# Patient Record
Sex: Female | Born: 1950 | Race: White | Hispanic: No | Marital: Married | Smoking: Never smoker
Health system: Southern US, Community
[De-identification: ages and names within clinical notes are randomized; demographics above are authoritative.]

## PROBLEM LIST (undated history)

## (undated) DIAGNOSIS — Z923 Personal history of irradiation: Secondary | ICD-10-CM

## (undated) DIAGNOSIS — E039 Hypothyroidism, unspecified: Secondary | ICD-10-CM

## (undated) DIAGNOSIS — D649 Anemia, unspecified: Secondary | ICD-10-CM

## (undated) DIAGNOSIS — C50919 Malignant neoplasm of unspecified site of unspecified female breast: Secondary | ICD-10-CM

## (undated) DIAGNOSIS — E785 Hyperlipidemia, unspecified: Secondary | ICD-10-CM

## (undated) DIAGNOSIS — A630 Anogenital (venereal) warts: Secondary | ICD-10-CM

## (undated) DIAGNOSIS — K219 Gastro-esophageal reflux disease without esophagitis: Secondary | ICD-10-CM

## (undated) DIAGNOSIS — M1990 Unspecified osteoarthritis, unspecified site: Secondary | ICD-10-CM

## (undated) HISTORY — PX: APPENDECTOMY: SHX54

## (undated) HISTORY — DX: Anogenital (venereal) warts: A63.0

## (undated) HISTORY — DX: Hypothyroidism, unspecified: E03.9

## (undated) HISTORY — DX: Hyperlipidemia, unspecified: E78.5

## (undated) HISTORY — DX: Gastro-esophageal reflux disease without esophagitis: K21.9

## (undated) HISTORY — DX: Malignant neoplasm of unspecified site of unspecified female breast: C50.919

## (undated) HISTORY — PX: WRIST FRACTURE SURGERY: SHX121

## (undated) HISTORY — PX: OTHER SURGICAL HISTORY: SHX169

---

## 2008-09-12 ENCOUNTER — Ambulatory Visit: Payer: Self-pay | Admitting: Unknown Physician Specialty

## 2010-05-23 ENCOUNTER — Ambulatory Visit: Payer: Self-pay | Admitting: Unknown Physician Specialty

## 2012-05-16 ENCOUNTER — Ambulatory Visit: Payer: Self-pay | Admitting: Internal Medicine

## 2012-05-16 DIAGNOSIS — M25569 Pain in unspecified knee: Secondary | ICD-10-CM | POA: Insufficient documentation

## 2012-06-24 ENCOUNTER — Ambulatory Visit: Payer: Self-pay | Admitting: Orthopedic Surgery

## 2013-05-11 ENCOUNTER — Emergency Department: Payer: Self-pay | Admitting: Emergency Medicine

## 2016-03-09 DIAGNOSIS — Z923 Personal history of irradiation: Secondary | ICD-10-CM

## 2016-03-09 DIAGNOSIS — C50919 Malignant neoplasm of unspecified site of unspecified female breast: Secondary | ICD-10-CM

## 2016-03-09 HISTORY — PX: BREAST LUMPECTOMY: SHX2

## 2016-03-09 HISTORY — DX: Malignant neoplasm of unspecified site of unspecified female breast: C50.919

## 2016-03-09 HISTORY — DX: Personal history of irradiation: Z92.3

## 2016-07-29 ENCOUNTER — Other Ambulatory Visit (HOSPITAL_COMMUNITY): Payer: Self-pay | Admitting: Obstetrics and Gynecology

## 2016-08-25 ENCOUNTER — Ambulatory Visit (INDEPENDENT_AMBULATORY_CARE_PROVIDER_SITE_OTHER): Payer: BC Managed Care – PPO | Admitting: Primary Care

## 2016-08-25 ENCOUNTER — Encounter: Payer: Self-pay | Admitting: Primary Care

## 2016-08-25 VITALS — BP 128/78 | HR 74 | Temp 98.3°F | Ht 60.0 in | Wt 147.8 lb

## 2016-08-25 DIAGNOSIS — E785 Hyperlipidemia, unspecified: Secondary | ICD-10-CM

## 2016-08-25 DIAGNOSIS — E039 Hypothyroidism, unspecified: Secondary | ICD-10-CM

## 2016-08-25 MED ORDER — LEVOTHYROXINE SODIUM 100 MCG PO TABS: ORAL_TABLET | ORAL | 1 refills | Status: DC

## 2016-08-25 NOTE — Progress Notes (Signed)
   Subjective:    Patient ID: Mallory Leblanc, female    DOB: 08-04-1950, 66 y.o.   MRN: [id number]  HPI  Ms. Mallory Leblanc is a 65 year old female who presents today to establish care and discuss the problems mentioned below. Will obtain old records. She is currently following with OB/GYN who completed labs last week.  1) Hypothyroidism: Currently managed on levothyroxine 100 mcg. Her last TSH was normal, lower end of normal on 08/21/16. She denies palpitations, fatigue, hair loss. She is needing a refill today.  2) Hyperlipidemia: Currently managed on Simvastatin 20 mg. Her last lipid panel was on 08/21/16 with TC of 201, Trigs of 236, LDL of 98. She endorses a poor diet and is not exercising. She would like to work on diet and exercise.  Diet currently consists of:  Breakfast: Boiled egg, pop tart, pizza, french toast sticks Lunch: Lunchable, crackers with tuna and chicken, sandwich Dinner: Pasta, salad, chicken, beans, some bread, fried fish, baked potato  Snacks: Candy, fruit Desserts: Occasionally  Beverages: Water, coffee, some soda, occasional sweet tea, beer  Exercise: She is not currently execising    Review of Systems  Constitutional: Negative for fatigue.  Eyes: Negative for visual disturbance.  Respiratory: Negative for shortness of breath.   Cardiovascular: Negative for chest pain and palpitations.  Musculoskeletal: Negative for myalgias.  Neurological: Negative for headaches.       Past Medical History:  Diagnosis Date  . Genital warts   . GERD (gastroesophageal reflux disease)   . Hyperlipidemia   . Hypothyroidism      Social History   Social History  . Marital status: Married    Spouse name: N/A  . Number of children: N/A  . Years of education: N/A   Occupational History  . Not on file.   Social History Main Topics  . Smoking status: Never Smoker  . Smokeless tobacco: Never Used  . Alcohol use Yes  . Drug use: Unknown  . Sexual activity: Not on file    Other Topics Concern  . Not on file   Social History Narrative   Married.   1 child.    Works as a Optometrist.   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    Past Surgical History:  Procedure Laterality Date  . cyst     on the left ovary early 80's  . WRIST FRACTURE SURGERY Right     Family History  Problem Relation Age of Onset  . Heart disease Father     Allergies  Allergen Reactions  . Sulfa Antibiotics Rash    No current outpatient prescriptions on file prior to visit.   No current facility-administered medications on file prior to visit.     BP 128/78   Pulse 74   Temp 98.3 F (36.8 C) (Oral)   Ht 5' (1.524 m)   Wt 147 lb 12.8 oz (67 kg)   SpO2 98%   BMI 28.87 kg/m    Objective:   Physical Exam  Constitutional: She appears well-nourished.  Neck: Neck supple. No thyromegaly present.  Cardiovascular: Normal rate and regular rhythm.   Pulmonary/Chest: Effort normal and breath sounds normal.  Skin: Skin is warm and dry.  Psychiatric: She has a normal mood and affect.          Assessment & Plan:

## 2016-08-25 NOTE — Patient Instructions (Signed)
I sent refills of the levothyroxine 100 mcg tablets to your pharmacy.   It's important to improve your diet by reducing consumption of fast food, fried food, processed snack foods, sugary drinks. Increase consumption of fresh vegetables and fruits, whole grains, water.  Ensure you are drinking 64 ounces of water daily.  Start exercising. You should be getting 150 minutes of moderate intensity exercise weekly.  Schedule a lab only appointment in 3 months to recheck your cholesterol. Be sure to come fasting to this appointment.  It was a pleasure to meet you today! Please don't hesitate to call me with any questions. Welcome to [facility]!

## 2016-08-25 NOTE — Assessment & Plan Note (Signed)
Recent TSH stable, lower end of normal. Refill for levothyroxine 100 mcg sent to pharmacy. Recheck in 3 months to ensure stability.

## 2016-08-25 NOTE — Assessment & Plan Note (Signed)
Recent lipid panel with TC and Trigs above goal. Poor diet and does not exercise. Will have her work on both exercise and diet, repeat lipids in three months. If lipids above goal at that point then increase Zocor to 40 mg.

## 2016-09-02 ENCOUNTER — Other Ambulatory Visit: Payer: Self-pay | Admitting: *Deleted

## 2016-09-02 ENCOUNTER — Inpatient Hospital Stay
Admission: RE | Admit: 2016-09-02 | Discharge: 2016-09-02 | Disposition: A | Discharge status: Home / Self Care | Payer: Self-pay | Source: Ambulatory Visit | Attending: *Deleted | Admitting: *Deleted

## 2016-09-02 DIAGNOSIS — Z9289 Personal history of other medical treatment: Secondary | ICD-10-CM

## 2016-09-04 ENCOUNTER — Other Ambulatory Visit: Payer: Self-pay | Admitting: Obstetrics and Gynecology

## 2016-09-04 DIAGNOSIS — R921 Mammographic calcification found on diagnostic imaging of breast: Secondary | ICD-10-CM

## 2016-09-08 ENCOUNTER — Ambulatory Visit
Admission: RE | Admit: 2016-09-08 | Discharge: 2016-09-08 | Disposition: A | Discharge status: Home / Self Care | Payer: BC Managed Care – PPO | Source: Ambulatory Visit | Attending: Obstetrics and Gynecology | Admitting: Obstetrics and Gynecology

## 2016-09-08 DIAGNOSIS — R921 Mammographic calcification found on diagnostic imaging of breast: Secondary | ICD-10-CM | POA: Diagnosis present

## 2016-09-14 ENCOUNTER — Other Ambulatory Visit: Payer: Self-pay | Admitting: Obstetrics and Gynecology

## 2016-09-14 DIAGNOSIS — R921 Mammographic calcification found on diagnostic imaging of breast: Secondary | ICD-10-CM

## 2016-09-14 DIAGNOSIS — R928 Other abnormal and inconclusive findings on diagnostic imaging of breast: Secondary | ICD-10-CM

## 2016-09-17 ENCOUNTER — Encounter: Payer: Self-pay | Admitting: Primary Care

## 2016-09-21 ENCOUNTER — Ambulatory Visit
Admission: RE | Admit: 2016-09-21 | Discharge: 2016-09-21 | Disposition: A | Discharge status: Home / Self Care | Payer: BC Managed Care – PPO | Source: Ambulatory Visit | Attending: Obstetrics and Gynecology | Admitting: Obstetrics and Gynecology

## 2016-09-21 DIAGNOSIS — R928 Other abnormal and inconclusive findings on diagnostic imaging of breast: Secondary | ICD-10-CM

## 2016-09-21 DIAGNOSIS — D0512 Intraductal carcinoma in situ of left breast: Secondary | ICD-10-CM | POA: Diagnosis not present

## 2016-09-21 DIAGNOSIS — R921 Mammographic calcification found on diagnostic imaging of breast: Secondary | ICD-10-CM | POA: Diagnosis present

## 2016-09-21 HISTORY — PX: BREAST BIOPSY: SHX20

## 2016-09-24 ENCOUNTER — Other Ambulatory Visit: Payer: Self-pay

## 2016-09-24 NOTE — Progress Notes (Signed)
  Oncology Nurse Navigator Documentation  Navigator Location: [facility] On[facility] (09/24/16 1500)   )Navigator Encounter Type: Introductory phone call (09/24/16 1500)   Abnormal Finding Date: 09/08/16 (09/24/16 1500) Confirmed Diagnosis Date: 09/21/16 (09/24/16 1500)               Patient Visit Type: Initial (09/24/16 1500)   Barriers/Navigation Needs: Education;Coordination of Care (09/24/16 1500) Education: Accessing Care/ Finding Providers;Coping with Diagnosis/ Prognosis;Newly Diagnosed Cancer Education (09/24/16 1500) Interventions: Coordination of Care;Education (09/24/16 1500)   Coordination of Care: Appts (09/24/16 1500) Education Method: Written;Verbal;Teach-back (09/24/16 1500)                Time Spent with Patient: 60 (09/24/16 1500)   Phoned patient to introduce Navigation service.  Scheduled Med/Onc appointment with Dr. Tasia Catchings on 09/29/16 at 10:00, and Surgical consult with Dr. Tamala Julian on 10/08/16 at 12:30.  Patient came by [place] to pick up Breast Cancer Treatment Handbook/folder with hospital services.

## 2016-09-25 LAB — SURGICAL PATHOLOGY

## 2016-09-29 ENCOUNTER — Encounter: Payer: Self-pay | Admitting: Oncology

## 2016-09-29 ENCOUNTER — Encounter: Payer: Self-pay | Admitting: *Deleted

## 2016-09-29 ENCOUNTER — Inpatient Hospital Stay: Payer: BC Managed Care – PPO | Attending: Oncology | Admitting: Oncology

## 2016-09-29 VITALS — BP 167/85 | HR 78 | Wt 146.8 lb

## 2016-09-29 DIAGNOSIS — E039 Hypothyroidism, unspecified: Secondary | ICD-10-CM | POA: Insufficient documentation

## 2016-09-29 DIAGNOSIS — Z79899 Other long term (current) drug therapy: Secondary | ICD-10-CM | POA: Diagnosis not present

## 2016-09-29 DIAGNOSIS — E785 Hyperlipidemia, unspecified: Secondary | ICD-10-CM | POA: Diagnosis not present

## 2016-09-29 DIAGNOSIS — D0512 Intraductal carcinoma in situ of left breast: Secondary | ICD-10-CM | POA: Diagnosis not present

## 2016-09-29 DIAGNOSIS — K219 Gastro-esophageal reflux disease without esophagitis: Secondary | ICD-10-CM | POA: Diagnosis not present

## 2016-09-29 DIAGNOSIS — D0510 Intraductal carcinoma in situ of unspecified breast: Secondary | ICD-10-CM | POA: Insufficient documentation

## 2016-09-29 DIAGNOSIS — Z853 Personal history of malignant neoplasm of breast: Secondary | ICD-10-CM | POA: Insufficient documentation

## 2016-09-29 NOTE — Progress Notes (Signed)
[facility] Consultation Visit:  Patient Care Team: Pleas Koch, NP as PCP - General (Internal Medicine)  CHIEF COMPLAINTS/PURPOSE OF CONSULTATION: I have breast cancer. HISTORY OF PRESENTING ILLNESS: Mallory Leblanc 66 y.o. 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 Patient had screening mammogram done on 08/21/2016 with [facility] healthcare system which revealed grouped calcification that approximately 1 cm in the upper outer quadrant of the left breast 9 cm from nipple which are indicated to terminate a prominent left axillary lymph node is present and is unchanged when compared to the ultrasound of 13 and 2711 studies no other dermatitis is seen in the left breast there are no suspicious masses malignant calcifications site of architecture distortion or concerning asymmetries in the right breast. Patient had diagnostic mammogram unilateral left down on September 09 2006, followed by rest left upper outer quadrant stereotactic biopsy. Pathology showed DCIS intermediate grade, calcifications associated with DCIS. DCIS is present in 4 out of 6 blocks with the largest focus measuring 6 mm. ER more than 90% positive. PR 50-90% positive. Today patient was accompanied by her husband to our clinic. She appears mild anxious. She denies any complaints. Denies any chest pain shortness of breath abdominal pain, lumps or bumps, back pain. She is postmenopausal.  Review of Systems  Constitutional: Negative.   HENT:  Negative.   Eyes: Negative.   Respiratory: Negative.   Cardiovascular: Negative.   Gastrointestinal: Negative.   Endocrine: Negative.   Genitourinary: Negative.    Musculoskeletal: Negative.   Skin: Negative.   Neurological: Negative.   Hematological: Negative.   Psychiatric/Behavioral: The patient is nervous/anxious.     MEDICAL HISTORY: Past Medical History:   Diagnosis Date  . Genital warts   . GERD (gastroesophageal reflux disease)   . Hyperlipidemia   . Hypothyroidism     SURGICAL HISTORY: Past Surgical History:  Procedure Laterality Date  . APPENDECTOMY    . BREAST BIOPSY Left 09/21/2016   Left Affirm Bx- Path pending  . cyst     on the left ovary early 80's  . WRIST FRACTURE SURGERY Right     SOCIAL HISTORY: Social History   Social History  . Marital status: Married    Spouse name: N/A  . Number of children: N/A  . Years of education: N/A   Occupational History  . Not on file.   Social History Main Topics  . Smoking status: Never Smoker  . Smokeless tobacco: Never Used  . Alcohol use Yes  . Drug use: No  . Sexual activity: Not on file   Other Topics Concern  . Not on file   Social History Narrative   Married.   1 child.    Works as a Optometrist.   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    FAMILY HISTORY Family History  Problem Relation Age of Onset  . Heart disease Father     ALLERGIES:  is allergic to sulfa antibiotics.  MEDICATIONS:  Current Outpatient Prescriptions  Medication Sig Dispense Refill  . levothyroxine (SYNTHROID, LEVOTHROID) 100 MCG tablet Take 1 tablet by mouth every morning on an empty stomach with a full glass of water. 90 tablet 1  . simvastatin (ZOCOR) 20 MG tablet TAKE ONE AND ONE-HALF TABLET BY MOUTH DAILY    . vitamin B-12 (CYANOCOBALAMIN) 1000 MCG tablet Take 1,000 mcg by mouth daily.    Marland Kitchen  Vitamin D, Ergocalciferol, 2000 units CAPS Take 1 capsule by mouth daily.     No current facility-administered medications for this visit.     PHYSICAL EXAMINATION:  ECOG PERFORMANCE STATUS: 0 - Asymptomatic   Vitals:   09/29/16 1013  BP: (!) 167/85  Pulse: 78    Filed Weights   09/29/16 1013  Weight: 146 lb 12.8 oz (66.6 kg)     Physical Exam GENERAL: No distress, well nourished.  SKIN:  No rashes or significant lesions  HEAD:  Normocephalic, No masses, lesions, tenderness or abnormalities  EYES: Conjunctiva are pink, non icteric ENT: External ears normal ,lips , buccal mucosa, and tongue normal and mucous membranes are moist  LYMPH: No palpable cervical and axillary lymphadenopathy  LUNGS: Clear to auscultation, no crackles or wheezes HEART: Regular rate & rhythm, no murmurs, no gallops, S1 normal and S2 normal  ABDOMEN: Abdomen soft, non-tender, normal bowel sounds, I did not appreciate any  masses or organomegaly  MUSCULOSKELETAL: No CVA tenderness and no tenderness on percussion of the back or rib cage.  EXTREMITIES: No edema, no skin discoloration or tenderness NEURO: Alert & oriented, no focal motor/sensory deficits. .Breast exam was performed in seated and lying down position. Left outer quadrant thickening tissue, no clearly pal palpable masses. No evidence of axillary adenopathy. No evidence of any palpable masses or lumps in the right breast. No evidence of right axillary adenopathy  LABORATORY DATA: I have personally reviewed the data as listed: Surgical pathology 09/18/2016 SPECIMEN SUBMITTED:  A. Breast, left, UOQ  CLINICAL HISTORY:  Indeterminate calcifications  PRE-OPERATIVE DIAGNOSIS:  DCIS  POST-OPERATIVE DIAGNOSIS:  None provided.  DIAGNOSIS:  A. BREAST, LEFT UPPER OUTER QUADRANT; STEREOTACTIC BIOPSY:  - DUCTAL CARCINOMA IN SITU, INTERMEDIATE GRADE.  - CALCIFICATIONS ASSOCIATED WITH DCIS.    RADIOGRAPHIC STUDIES: I have personally reviewed the radiological images as listed and agree with the findings in the report Mammogram unilateral 09/08/2016  Mammogram screening bilateral 08/21/2016.  Finding' dictated in HPI.   ASSESSMENT/PLAN Cancer Staging Ductal carcinoma in situ (DCIS) of left breast Staging form: Breast, AJCC 8th Edition - Clinical stage from 09/21/2016: Stage 0 (cTis (DCIS), cN0, cM0, ER: Positive, PR: Positive, HER2: Not Assessed) - Signed by Earlie Server, MD on 09/29/2016    reviewed pathology results with patient and her husband. Discussed with them that patient has low/intermediate risk DCIS. Standard treatment will be lumpectomy followed by radiation, followed by adjuvant endocrine treatment. Discussed with patient that the majority of patients with low risk DCIS may never progress to invasive cancer if left untreated and carefully monitored. There is a clinical trial here at [facility], COMET trial, which is actively recording patient with low risk DCIS comparing active surveillance to distended treatment arm. I provided information to patient and ask if she is interested to talk to research department and obtain more information. She is interested an open for more information at this point. We will refer her to talk with research nurse. Patient already has a clinic appointment with Dr. Tamala Julian for discussion of her surgery. If she chose to proceed standard treatment, we'll refer her to see Dr. Donella Stade RAD ONC. She will update [place] regarding her decision and we'll schedule her follow-up appointment with me accordingly. Her case was discussed on breast tumor Board on September 28 2016, with a consensus of above recommendation.  All questions were answered. The patient knows to call the clinic with any problems, questions or concerns. Thank you for this kind referral and the  opportunity to participate in the care of this patient. A copy of today's note will be routed to the referring physician Dr.Schermerhorn.   Dr. Earlie Server, MD, PhD [facility] at [facility] - Inpatient Pager- [phone] 09/29/2016

## 2016-10-08 ENCOUNTER — Other Ambulatory Visit: Payer: Self-pay | Admitting: Surgery

## 2016-10-08 DIAGNOSIS — D0512 Intraductal carcinoma in situ of left breast: Secondary | ICD-10-CM

## 2016-10-09 ENCOUNTER — Other Ambulatory Visit: Payer: Self-pay | Admitting: Surgery

## 2016-10-09 DIAGNOSIS — D0512 Intraductal carcinoma in situ of left breast: Secondary | ICD-10-CM

## 2016-10-14 ENCOUNTER — Encounter
Admission: RE | Admit: 2016-10-14 | Discharge: 2016-10-14 | Disposition: A | Discharge status: Home / Self Care | Payer: BC Managed Care – PPO | Source: Ambulatory Visit | Attending: Surgery | Admitting: Surgery

## 2016-10-14 HISTORY — DX: Anemia, unspecified: D64.9

## 2016-10-14 HISTORY — DX: Unspecified osteoarthritis, unspecified site: M19.90

## 2016-10-14 NOTE — Patient Instructions (Signed)
  Your procedure is scheduled on: 10-16-16 Report to [facility] @ 8:15 AM  Remember: Instructions that are not followed completely may result in serious medical risk, up to and including death, or upon the discretion of your surgeon and anesthesiologist your surgery may need to be rescheduled.    _x___ 1. Do not eat food or drink liquids after midnight. No gum chewing or hard candies.     __x__ 2. No Alcohol for 24 hours before or after surgery.   __x__3. No Smoking for 24 prior to surgery.   ____  4. Bring all medications with you on the day of surgery if instructed.    __x__ 5. Notify your doctor if there is any change in your medical condition     (cold, fever, infections).     Do not wear jewelry, make-up, hairpins, clips or nail polish.  Do not wear lotions, powders, or perfumes. You may wear deodorant.  Do not shave 48 hours prior to surgery. Men may shave face and neck.  Do not bring valuables to the hospital.    [facility] is not responsible for any belongings or valuables.               Contacts, dentures or bridgework may not be worn into surgery.  Leave your suitcase in the car. After surgery it may be brought to your room.  For patients admitted to the hospital, discharge time is determined by your treatment team.   Patients discharged the day of surgery will not be allowed to drive home.  You will need someone to drive you home and stay with you the night of your procedure.    Please read over the following fact sheets that you were given:   [facility] Preparing for Surgery and or MRSA Information   _x___ Take anti-hypertensive (unless it includes a diuretic), cardiac, seizure, asthma,     anti-reflux and psychiatric medicines. These include:  1. OMEPRAZOLE  2. LEVOTHYROXINE  3. TAKE AN EXTRA OMEPRAZOLE Thursday NIGHT BEFORE BED  4.  5.  6.  ____Fleets enema or Magnesium Citrate as directed.   ____ Use CHG Soap or sage wipes as directed on  instruction sheet   ____ Use inhalers on the day of surgery and bring to hospital day of surgery  ____ Stop Metformin and Janumet 2 days prior to surgery.    ____ Take 1/2 of usual insulin dose the night before surgery and none on the morning     surgery.    ___ Follow recommendations from Cardiologist, Pulmonologist or PCP regarding stopping Aspirin, Coumadin, Pllavix ,Eliquis, Effient, or Pradaxa, and Pletal.  X____Stop Anti-inflammatories such as Advil, Aleve, Ibuprofen, Motrin, Naproxen, Naprosyn, Goodies powders or aspirin products NOW-OK to take Tylenol    ____ Stop supplements until after surgery.     ____ Bring C-Pap to the hospital.

## 2016-10-16 ENCOUNTER — Ambulatory Visit: Payer: BC Managed Care – PPO | Admitting: Certified Registered Nurse Anesthetist

## 2016-10-16 ENCOUNTER — Ambulatory Visit
Admission: RE | Admit: 2016-10-16 | Discharge: 2016-10-16 | Disposition: A | Discharge status: Home / Self Care | Payer: BC Managed Care – PPO | Source: Ambulatory Visit | Attending: Surgery | Admitting: Surgery

## 2016-10-16 ENCOUNTER — Encounter: Payer: Self-pay | Admitting: *Deleted

## 2016-10-16 ENCOUNTER — Encounter
Admission: RE | Disposition: A | Discharge status: Home / Self Care | Payer: Self-pay | Source: Ambulatory Visit | Attending: Surgery

## 2016-10-16 DIAGNOSIS — D0592 Unspecified type of carcinoma in situ of left breast: Secondary | ICD-10-CM | POA: Diagnosis present

## 2016-10-16 DIAGNOSIS — Z79899 Other long term (current) drug therapy: Secondary | ICD-10-CM | POA: Insufficient documentation

## 2016-10-16 DIAGNOSIS — K219 Gastro-esophageal reflux disease without esophagitis: Secondary | ICD-10-CM | POA: Diagnosis not present

## 2016-10-16 DIAGNOSIS — E039 Hypothyroidism, unspecified: Secondary | ICD-10-CM | POA: Diagnosis not present

## 2016-10-16 DIAGNOSIS — D0512 Intraductal carcinoma in situ of left breast: Secondary | ICD-10-CM

## 2016-10-16 HISTORY — PX: PARTIAL MASTECTOMY WITH NEEDLE LOCALIZATION: SHX6008

## 2016-10-16 HISTORY — PX: SENTINEL NODE BIOPSY: SHX6608

## 2016-10-16 SURGERY — PARTIAL MASTECTOMY WITH NEEDLE LOCALIZATION
Anesthesia: General | Laterality: Left | Wound class: Clean

## 2016-10-16 MED ORDER — ONDANSETRON HCL 4 MG/2ML IJ SOLN
INTRAMUSCULAR | Status: AC
  Filled 2016-10-16: qty 2

## 2016-10-16 MED ORDER — HYDROCODONE-ACETAMINOPHEN 5-325 MG PO TABS: 1.0000 | ORAL_TABLET | ORAL | 0 refills | Status: DC | PRN

## 2016-10-16 MED ORDER — MIDAZOLAM HCL 2 MG/2ML IJ SOLN
INTRAMUSCULAR | Status: DC | PRN
Start: 2016-10-16 — End: 2016-10-16
  Administered 2016-10-16: 2 mg via INTRAVENOUS

## 2016-10-16 MED ORDER — MEPERIDINE HCL 50 MG/ML IJ SOLN: 6.2500 mg | INTRAMUSCULAR | Status: DC | PRN

## 2016-10-16 MED ORDER — FENTANYL CITRATE (PF) 100 MCG/2ML IJ SOLN
INTRAMUSCULAR | Status: AC
  Filled 2016-10-16: qty 2

## 2016-10-16 MED ORDER — SUGAMMADEX SODIUM 200 MG/2ML IV SOLN
INTRAVENOUS | Status: DC | PRN
  Administered 2016-10-16: 140 mg via INTRAVENOUS

## 2016-10-16 MED ORDER — HYDROCODONE-ACETAMINOPHEN 5-325 MG PO TABS: 1.0000 | ORAL_TABLET | ORAL | Status: DC | PRN

## 2016-10-16 MED ORDER — MIDAZOLAM HCL 2 MG/2ML IJ SOLN
INTRAMUSCULAR | Status: AC
  Filled 2016-10-16: qty 2

## 2016-10-16 MED ORDER — LACTATED RINGERS IV SOLN
INTRAVENOUS | Status: DC
  Administered 2016-10-16 (×2): via INTRAVENOUS

## 2016-10-16 MED ORDER — ONDANSETRON HCL 4 MG/2ML IJ SOLN
INTRAMUSCULAR | Status: DC | PRN
  Administered 2016-10-16: 4 mg via INTRAVENOUS

## 2016-10-16 MED ORDER — SUGAMMADEX SODIUM 200 MG/2ML IV SOLN
INTRAVENOUS | Status: AC
  Filled 2016-10-16: qty 2

## 2016-10-16 MED ORDER — PROMETHAZINE HCL 25 MG/ML IJ SOLN: 6.2500 mg | INTRAMUSCULAR | Status: DC | PRN

## 2016-10-16 MED ORDER — ROCURONIUM BROMIDE 50 MG/5ML IV SOLN
INTRAVENOUS | Status: AC
  Filled 2016-10-16: qty 1

## 2016-10-16 MED ORDER — DEXAMETHASONE SODIUM PHOSPHATE 10 MG/ML IJ SOLN
INTRAMUSCULAR | Status: DC | PRN
  Administered 2016-10-16: 10 mg via INTRAVENOUS

## 2016-10-16 MED ORDER — OXYCODONE HCL 5 MG PO TABS: 5.0000 mg | ORAL_TABLET | Freq: Once | ORAL | Status: DC | PRN

## 2016-10-16 MED ORDER — PROPOFOL 10 MG/ML IV BOLUS
INTRAVENOUS | Status: AC
  Filled 2016-10-16: qty 20

## 2016-10-16 MED ORDER — BUPIVACAINE-EPINEPHRINE (PF) 0.5% -1:200000 IJ SOLN
INTRAMUSCULAR | Status: AC
  Filled 2016-10-16: qty 30

## 2016-10-16 MED ORDER — LIDOCAINE HCL (CARDIAC) 20 MG/ML IV SOLN
INTRAVENOUS | Status: DC | PRN
  Administered 2016-10-16: 100 mg via INTRAVENOUS

## 2016-10-16 MED ORDER — OXYCODONE HCL 5 MG/5ML PO SOLN: 5.0000 mg | Freq: Once | ORAL | Status: DC | PRN

## 2016-10-16 MED ORDER — EPHEDRINE SULFATE 50 MG/ML IJ SOLN
INTRAMUSCULAR | Status: DC | PRN
  Administered 2016-10-16 (×2): 10 mg via INTRAVENOUS

## 2016-10-16 MED ORDER — TECHNETIUM TC 99M SULFUR COLLOID FILTERED
0.8410 | Freq: Once | INTRAVENOUS | Status: AC | PRN
  Administered 2016-10-16: 0.841 via INTRADERMAL

## 2016-10-16 MED ORDER — FENTANYL CITRATE (PF) 100 MCG/2ML IJ SOLN
INTRAMUSCULAR | Status: DC | PRN
  Administered 2016-10-16 (×3): 50 ug via INTRAVENOUS

## 2016-10-16 MED ORDER — ROCURONIUM BROMIDE 100 MG/10ML IV SOLN
INTRAVENOUS | Status: DC | PRN
  Administered 2016-10-16: 50 mg via INTRAVENOUS

## 2016-10-16 MED ORDER — DEXAMETHASONE SODIUM PHOSPHATE 10 MG/ML IJ SOLN
INTRAMUSCULAR | Status: AC
  Filled 2016-10-16: qty 1

## 2016-10-16 MED ORDER — PROPOFOL 10 MG/ML IV BOLUS
INTRAVENOUS | Status: DC | PRN
  Administered 2016-10-16: 40 mg via INTRAVENOUS
  Administered 2016-10-16: 110 mg via INTRAVENOUS

## 2016-10-16 MED ORDER — LIDOCAINE HCL (PF) 2 % IJ SOLN
INTRAMUSCULAR | Status: AC
  Filled 2016-10-16: qty 2

## 2016-10-16 MED ORDER — FENTANYL CITRATE (PF) 100 MCG/2ML IJ SOLN: 25.0000 ug | INTRAMUSCULAR | Status: DC | PRN

## 2016-10-16 MED ORDER — BUPIVACAINE-EPINEPHRINE 0.5% -1:200000 IJ SOLN
INTRAMUSCULAR | Status: DC | PRN
  Administered 2016-10-16: 15 mL

## 2016-10-16 SURGICAL SUPPLY — 36 items
BLADE SURG 15 STRL LF DISP TIS (BLADE) ×1 IMPLANT
BLADE SURG 15 STRL SS (BLADE) ×2
CANISTER SUCT 1200ML W/VALVE (MISCELLANEOUS) ×3 IMPLANT
CHLORAPREP W/TINT 26ML (MISCELLANEOUS) ×3 IMPLANT
CNTNR SPEC 2.5X3XGRAD LEK (MISCELLANEOUS) ×1
CONT SPEC 4OZ STER OR WHT (MISCELLANEOUS) ×2
CONTAINER SPEC 2.5X3XGRAD LEK (MISCELLANEOUS) ×1 IMPLANT
DERMABOND ADVANCED (GAUZE/BANDAGES/DRESSINGS) ×2
DERMABOND ADVANCED .7 DNX12 (GAUZE/BANDAGES/DRESSINGS) ×1 IMPLANT
DEVICE DUBIN SPECIMEN MAMMOGRA (MISCELLANEOUS) ×3 IMPLANT
DRAPE LAPAROTOMY 77X122 PED (DRAPES) ×3 IMPLANT
ELECT REM PT RETURN 9FT ADLT (ELECTROSURGICAL) ×3
ELECTRODE REM PT RTRN 9FT ADLT (ELECTROSURGICAL) ×1 IMPLANT
GLOVE BIO SURGEON STRL SZ7.5 (GLOVE) ×9 IMPLANT
GLOVE BIOGEL PI IND STRL 6.5 (GLOVE) ×1 IMPLANT
GLOVE BIOGEL PI INDICATOR 6.5 (GLOVE) ×2
GLOVE INDICATOR 7.0 STRL GRN (GLOVE) ×3 IMPLANT
GOWN STRL REUS W/ TWL LRG LVL3 (GOWN DISPOSABLE) ×2 IMPLANT
GOWN STRL REUS W/TWL LRG LVL3 (GOWN DISPOSABLE) ×4
KIT RM TURNOVER STRD PROC AR (KITS) ×3 IMPLANT
LABEL OR SOLS (LABEL) ×3 IMPLANT
MARGIN MAP 10MM (MISCELLANEOUS) ×3 IMPLANT
NDL SAFETY 18GX1.5 (NEEDLE) IMPLANT
NDL SAFETY 22GX1.5 (NEEDLE) ×3 IMPLANT
NEEDLE HYPO 25X1 1.5 SAFETY (NEEDLE) ×3 IMPLANT
PACK BASIN MINOR ARMC (MISCELLANEOUS) ×3 IMPLANT
SLEVE PROBE SENORX GAMMA FIND (MISCELLANEOUS) ×3 IMPLANT
SUT CHROMIC 3 0 SH 27 (SUTURE) IMPLANT
SUT CHROMIC 4 0 RB 1X27 (SUTURE) ×3 IMPLANT
SUT ETHILON 3-0 FS-10 30 BLK (SUTURE) ×3
SUT MNCRL 4-0 (SUTURE) ×2
SUT MNCRL 4-0 27XMFL (SUTURE) ×1
SUTURE EHLN 3-0 FS-10 30 BLK (SUTURE) ×1 IMPLANT
SUTURE MNCRL 4-0 27XMF (SUTURE) ×1 IMPLANT
SYRINGE 10CC LL (SYRINGE) ×3 IMPLANT
WATER STERILE IRR 1000ML POUR (IV SOLUTION) ×3 IMPLANT

## 2016-10-16 NOTE — Anesthesia Procedure Notes (Signed)
Procedure Name: Intubation Date/Time: 10/16/2016 12:49 PM Performed by: Darlyne Russian Pre-anesthesia Checklist: Patient identified, Emergency Drugs available, Suction available, Patient being monitored and Timeout performed Patient Re-evaluated:Patient Re-evaluated prior to induction Oxygen Delivery Method: Circle system utilized Preoxygenation: Pre-oxygenation with 100% oxygen Induction Type: IV induction Ventilation: Mask ventilation without difficulty Laryngoscope Size: Mac and 3 Grade View: Grade III Tube type: Oral Tube size: 7.0 mm Number of attempts: 1 Airway Equipment and Method: Stylet Placement Confirmation: ETT inserted through vocal cords under direct vision,  positive ETCO2 and breath sounds checked- equal and bilateral Secured at: 21 cm Tube secured with: Tape Dental Injury: Teeth and Oropharynx as per pre-operative assessment

## 2016-10-16 NOTE — OR Nursing (Signed)
Dr. Tamala Julian in to see pt, advises ok to d/c to home.

## 2016-10-16 NOTE — Anesthesia Post-op Follow-up Note (Signed)
Anesthesia QCDR form completed.        

## 2016-10-16 NOTE — Transfer of Care (Signed)
Immediate Anesthesia Transfer of Care Note  Patient: Mallory Leblanc  Procedure(s) Performed: Procedure(s): PARTIAL MASTECTOMY WITH NEEDLE LOCALIZATION (Left) SENTINEL NODE BIOPSY (Left)  Patient Location: PACU  Anesthesia Type:General  Level of Consciousness: awake, alert , oriented and patient cooperative  Airway & Oxygen Therapy: Patient Spontanous Breathing and Patient connected to face mask oxygen  Post-op Assessment: Report given to RN, Post -op Vital signs reviewed and stable and Patient moving all extremities X 4  Post vital signs: Reviewed and stable  Last Vitals:  Vitals:   10/16/16 0928 10/16/16 1444  BP: (!) 149/73 (!) 153/73  Pulse: 69 99  Resp: 18 15  Temp: 36.8 C 37.6 C  SpO2: 98% 100%    Last Pain:  Vitals:   10/16/16 1444  TempSrc:   PainSc: 6          Complications: No apparent anesthesia complications

## 2016-10-16 NOTE — Discharge Instructions (Addendum)
Take Tylenol or Norco if needed for pain.  Should not drive or do anything dangerous when taking Norco.  Wear bra as desired for comfort and support.  May shower and blot dry.  AMBULATORY SURGERY  DISCHARGE INSTRUCTIONS   1) The drugs that you were given will stay in your system until tomorrow so for the next 24 hours you should not:  A) Drive an automobile B) Make any legal decisions C) Drink any alcoholic beverage   2) You may resume regular meals tomorrow.  Today it is better to start with liquids and gradually work up to solid foods.  You may eat anything you prefer, but it is better to start with liquids, then soup and crackers, and gradually work up to solid foods.   3) Please notify your doctor immediately if you have any unusual bleeding, trouble breathing, redness and pain at the surgery site, drainage, fever, or pain not relieved by medication.    4) Additional Instructions: TAKE A STOOL SOFTENER TWICE A DAY WHILE TAKING NARCOTIC PAIN MEDICINE TO PREVENT CONSTIPATION   Please contact your physician with any problems or Same Day Surgery at [phone], Monday through Friday 6 am to 4 pm, or [place] at [facility] number at [phone].

## 2016-10-16 NOTE — Anesthesia Preprocedure Evaluation (Signed)
Anesthesia Evaluation  Patient identified by MRN, date of birth, ID band Patient awake    Reviewed: Allergy & Precautions, NPO status , Patient's Chart, lab work & pertinent test results  History of Anesthesia Complications Negative for: history of anesthetic complications  Airway Mallampati: III  TM Distance: >3 FB Neck ROM: Full    Dental no notable dental hx.    Pulmonary neg pulmonary ROS, neg sleep apnea, neg COPD,    breath sounds clear to auscultation- rhonchi (-) wheezing      Cardiovascular Exercise Tolerance: Good (-) hypertension(-) CAD, (-) Past MI and (-) Cardiac Stents  Rhythm:Regular Rate:Normal - Systolic murmurs and - Diastolic murmurs    Neuro/Psych negative neurological ROS  negative psych ROS   GI/Hepatic Neg liver ROS, GERD  ,  Endo/Other  neg diabetesHypothyroidism   Renal/GU negative Renal ROS     Musculoskeletal  (+) Arthritis ,   Abdominal (+) - obese,   Peds  Hematology  (+) anemia ,   Anesthesia Other Findings Past Medical History: No date: Anemia No date: Arthritis     Comment:  LEFT KNEE No date: Cancer ([place]) No date: Genital warts No date: GERD (gastroesophageal reflux disease)     Comment:  OCC No date: Hyperlipidemia No date: Hypothyroidism   Reproductive/Obstetrics                             Anesthesia Physical Anesthesia Plan  ASA: II  Anesthesia Plan: General   Post-op Pain Management:    Induction: Intravenous  PONV Risk Score and Plan: 2 and Ondansetron and Dexamethasone  Airway Management Planned: Oral ETT  Additional Equipment:   Intra-op Plan:   Post-operative Plan: Extubation in OR  Informed Consent: I have reviewed the patients History and Physical, chart, labs and discussed the procedure including the risks, benefits and alternatives for the proposed anesthesia with the patient or authorized representative who has indicated  his/her understanding and acceptance.   Dental advisory given  Plan Discussed with: CRNA and Anesthesiologist  Anesthesia Plan Comments:         Anesthesia Quick Evaluation

## 2016-10-16 NOTE — Op Note (Signed)
OPERATIVE REPORT  PREOPERATIVE  DIAGNOSIS: . Right breast cancer  POSTOPERATIVE DIAGNOSIS: . Right breast cancer  PROCEDURE: . Right partial mastectomy, sentinel lymph node biopsy  ANESTHESIA:  General  SURGEON: Rochel Brome  MD   INDICATIONS: . She had recent mammogram depicting a cluster of microcalcifications in the upper outer quadrant of the left breast. Core needle biopsy demonstrated ductal carcinoma in situ. Surgery was recommended for definitive treatment. Sentinel lymph node was recommended due to the fact that the cancer is in the upper outer quadrant. The patient did have preoperative injection of radioactive technetium sulfur colloid. She also had insertion of a Kopan's wire. Mammogram images were reviewed prior to incision.  With the patient on the operating table in the supine position she was placed under general anesthesia. The dressing was removed from the left breast exposing the Kopan's wire which entered the upper outer quadrant. The wire was cut 2 cm from the skin. The site was prepared with ChloraPrep and draped in a sterile manner.  A curvilinear incision was made in the upper outer quadrant 6.5 cm from the nipple and carried down through subcutaneous tissues. The wire was encountered. Dissection was carried out to remove a mass of tissue surrounding the distal portion of the wire. As the tissue was being dissected sutures were used to attach markers to label the medial lateral cranial caudal superficial and deep margins. The specimen was submitted for specimen mammogram and pathology. The specimen mammogram demonstrated the location of the biopsy marker and calcifications in the specimen. The pathologist called to report that the margins appeared to be satisfactory.  The axilla was probed with a gamma counter demonstrating the location of radioactivity in the inferior aspect of the axilla. An oblique incision was made up proximally 4 cm in length and carried down through  subcutaneous tissues deeply within the axilla locating a lymph node with radioactivity. This lymph node was approximately 8 mm in dimension and was dissected free from surrounding tissues. The ex vivo count was in the range of 800- 900 counts per second. The background count was in the range of 3-17 counts per second. There was no remaining palpable mass within the axilla. The wound was inspected and hemostasis was intact. Subcuticular tissues were infiltrated with half percent Sensorcaine with epinephrine. The wound was closed with running 4-0 Monocryl subcuticular suture.  The breast wound was further inspected and several small bleeding points cauterized. Tissues were infiltrated with half percent Sensorcaine with epinephrine. Subcutaneous tissues were closed with interrupted 3-0 chromic sutures. The skin was closed with running 4-0 Monocryl subcutaneous suture. Both wounds were treated with Dermabond and allowed to dry. The patient tolerated surgery satisfactorily and was then prepared for transfer to the recovery room  [facility].D.

## 2016-10-16 NOTE — H&P (Signed)
  She comes today for left partial mastectomy with axillary sentinel lymph node biopsy. She has had injection of radioactive technetium sulfur colloid. She has had insertion of a Kopan's wire. Mammogram images were reviewed.  She reports no change in overall condition since the office visit.  The left side was marked YES  I discussed the surgery

## 2016-10-17 NOTE — Anesthesia Postprocedure Evaluation (Signed)
Anesthesia Post Note  Patient: Mallory Leblanc  Procedure(s) Performed: Procedure(s) (LRB): PARTIAL MASTECTOMY WITH NEEDLE LOCALIZATION (Left) SENTINEL NODE BIOPSY (Left)  Patient location during evaluation: PACU Anesthesia Type: General Level of consciousness: awake and alert and oriented Pain management: pain level controlled Vital Signs Assessment: post-procedure vital signs reviewed and stable Respiratory status: spontaneous breathing, nonlabored ventilation and respiratory function stable Cardiovascular status: blood pressure returned to baseline and stable Postop Assessment: no signs of nausea or vomiting Anesthetic complications: no     Last Vitals:  Vitals:   10/16/16 1529 10/16/16 1559  BP: (!) 154/78 (!) 151/76  Pulse: 94 95  Resp: 18 16  Temp: 36.6 C   SpO2: 97% 99%    Last Pain:  Vitals:   10/16/16 1529  TempSrc: Temporal  PainSc: 1                  Vinie Charity

## 2016-10-18 ENCOUNTER — Encounter: Payer: Self-pay | Admitting: Surgery

## 2016-10-19 LAB — SURGICAL PATHOLOGY

## 2016-10-22 NOTE — Progress Notes (Signed)
[facility] follow up visit Patient Care Team: Pleas Koch, NP as PCP - General (Internal Medicine)  CHIEF COMPLAINTS/PURPOSE OF CONSULTATION: I have breast cancer. HISTORY OF PRESENTING ILLNESS: Mallory Leblanc 66 y.o. 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 Patient had screening mammogram done on 08/21/2016 with [facility] healthcare system which revealed grouped calcification that approximately 1 cm in the upper outer quadrant of the left breast 9 cm from nipple which are indicated to terminate a prominent left axillary lymph node is present and is unchanged when compared to the ultrasound of 13 and 2711 studies no other dermatitis is seen in the left breast there are no suspicious masses malignant calcifications site of architecture distortion or concerning asymmetries in the right breast. Patient had diagnostic mammogram unilateral left down on September 09 2006, followed by rest left upper outer quadrant stereotactic biopsy. Pathology showed DCIS intermediate grade, calcifications associated with DCIS. DCIS is present in 4 out of 6 blocks with the largest focus measuring 6 mm. ER more than 90% positive. PR 50-90% positive. Today patient was accompanied by her husband to our clinic. She appears mild anxious. She denies any complaints. Denies any chest pain shortness of breath abdominal pain, lumps or bumps, back pain. She is postmenopausal.  INTERVAL HISTORY Patient presents to discuss about the results. No new complaints.    Review of Systems  Constitutional: Negative.   HENT:  Negative.   Eyes: Negative.   Respiratory: Negative.   Cardiovascular: Negative.   Gastrointestinal: Negative.   Endocrine: Negative.   Genitourinary: Negative.    Musculoskeletal: Negative.   Skin: Negative.   Neurological: Negative.   Hematological: Negative.   Psychiatric/Behavioral: The  patient is nervous/anxious.     MEDICAL HISTORY: Past Medical History:  Diagnosis Date  . Anemia   . Arthritis    LEFT KNEE  . Cancer ([place])   . Genital warts   . GERD (gastroesophageal reflux disease)    OCC  . Hyperlipidemia   . Hypothyroidism     SURGICAL HISTORY: Past Surgical History:  Procedure Laterality Date  . APPENDECTOMY    . BREAST BIOPSY Left 09/21/2016   Left Affirm Bx- Path pending  . cyst     on the left ovary early 80's  . PARTIAL MASTECTOMY WITH NEEDLE LOCALIZATION Left 10/16/2016   Procedure: PARTIAL MASTECTOMY WITH NEEDLE LOCALIZATION;  Surgeon: Leonie Green, MD;  Location: [facility] ORS;  Service: General;  Laterality: Left;  . SENTINEL NODE BIOPSY Left 10/16/2016   Procedure: SENTINEL NODE BIOPSY;  Surgeon: Leonie Green, MD;  Location: [facility] ORS;  Service: General;  Laterality: Left;  . WRIST FRACTURE SURGERY Right     SOCIAL HISTORY: Social History   Social History  . Marital status: Married    Spouse name: N/A  . Number of children: N/A  . Years of education: N/A   Occupational History  . Not on file.   Social History Main Topics  . Smoking status: Never Smoker  . Smokeless tobacco: Never Used  . Alcohol use Yes     Comment: BEER OCC  . Drug use: No  . Sexual activity: Not on file   Other Topics Concern  . Not on file   Social History Narrative   Married.   1 child.    Works as a Optometrist.   Enjoys riding her motorcycle, walking her dog, traveling to  the mountains.    FAMILY HISTORY Family History  Problem Relation Age of Onset  . Heart disease Father     ALLERGIES:  is allergic to sulfa antibiotics.  MEDICATIONS:  Current Outpatient Prescriptions  Medication Sig Dispense Refill  . acetaminophen (TYLENOL) 500 MG tablet Take 1,000 mg by mouth every 8 (eight) hours as needed for mild pain or moderate pain.    . Cholecalciferol (VITAMIN D) 2000 units tablet Take 2,000 Units by mouth daily.    Marland Kitchen  HYDROcodone-acetaminophen (NORCO) 5-325 MG tablet Take 1-2 tablets by mouth every 4 (four) hours as needed for moderate pain. 12 tablet 0  . levothyroxine (SYNTHROID, LEVOTHROID) 100 MCG tablet Take 1 tablet by mouth every morning on an empty stomach with a full glass of water. 90 tablet 1  . omeprazole (PRILOSEC) 20 MG capsule Take 20 mg by mouth every morning.    . simvastatin (ZOCOR) 20 MG tablet Take 30 mg by mouth at bedtime.    . vitamin B-12 (CYANOCOBALAMIN) 1000 MCG tablet Take 1,000 mcg by mouth daily.     No current facility-administered medications for this visit.     PHYSICAL EXAMINATION:  ECOG PERFORMANCE STATUS: 0 - Asymptomatic   There were no vitals filed for this visit.  There were no vitals filed for this visit.   Physical Exam GENERAL: No distress, well nourished.  SKIN:  No rashes or significant lesions  HEAD: Normocephalic, No masses, lesions, tenderness or abnormalities  EYES: Conjunctiva are pink, non icteric ENT: External ears normal ,lips , buccal mucosa, and tongue normal and mucous membranes are moist  LYMPH: No palpable cervical and axillary lymphadenopathy  LUNGS: Clear to auscultation, no crackles or wheezes HEART: Regular rate & rhythm, no murmurs, no gallops, S1 normal and S2 normal  ABDOMEN: Abdomen soft, non-tender, normal bowel sounds, I did not appreciate any  masses or organomegaly  MUSCULOSKELETAL: No CVA tenderness and no tenderness on percussion of the back or rib cage.  EXTREMITIES: No edema, no skin discoloration or tenderness NEURO: Alert & oriented, no focal motor/sensory deficits. Breast exam was performed in seated and lying down position. Patient is status post lumpectomy with a well-healed surgical scar. No evidence of any palpable masses. No evidence of axillary adenopathy. No evidence of any palpable masses or lumps in the right breast. No evidence of right axillary adenopathy   LABORATORY DATA: I have personally reviewed the data  as listed: Surgical pathology 09/18/2016 SPECIMEN SUBMITTED:  A. Breast, left, UOQ  CLINICAL HISTORY:  Indeterminate calcifications  PRE-OPERATIVE DIAGNOSIS:  DCIS  POST-OPERATIVE DIAGNOSIS:  None provided.  DIAGNOSIS:  A. BREAST, LEFT UPPER OUTER QUADRANT; STEREOTACTIC BIOPSY:  - DUCTAL CARCINOMA IN SITU, INTERMEDIATE GRADE.  - CALCIFICATIONS ASSOCIATED WITH DCIS.     Surgical Pathology 10/16/2016  CASE: [id number]  PATIENT: Mallory Leblanc  Surgical Pathology Report  SPECIMEN SUBMITTED:  A. Breast mass, left  B. Sentinel node 1, left  DIAGNOSIS:  A. BREAST MASS, LEFT; NEEDLE LOCALIZED EXCISION:  - RESIDUAL DUCTAL CARCINOMA IN SITU.  - SEE CANCER SUMMARY BELOW.  - BIOPSY SITE CHANGE.   B. SENTINEL LYMPH NODE 1, LEFT; EXCISION:  - ONE LYMPH NODE NEGATIVE FOR MALIGNANCY (0/1).   Surgical Pathology Cancer Case Summary   DUCTAL CARCINOMA IN SITU OF THE BREAST:  Procedure: Needle localized excision  Specimen Laterality: Left  Size (Extent) of DCIS: at least 7 mm  Histologic Type: Ductal carcinoma in situ (DCIS)  Nuclear Grade: 2  Necrosis: Not identified  Margins:  Negative for DCIS    Distance from closest margin: 0.1 mm to superior margin  Regional Lymph nodes:  Total # lymph nodes examined: 1    # Sentinel lymph nodes examined: 1    # Lymph nodes with macrometastasis (>2.0 mm): 0    # Lymph nodes with isolated tumor cells (<0.2 mm): 0    # Lymph nodes with micrometastasis (>0.2 mm and <2.0 mm): 0    Size of largest metastatic deposit: Not applicable    Extranodal extension: Not applicable  Pathologic Stage Classification (pTNM, AJCC 8th Edition): pTis (DCIS)  pN0 (sn)              RADIOGRAPHIC STUDIES: I have personally reviewed the radiological images as listed and agree with the findings in the report Mammogram unilateral 09/08/2016  Mammogram screening bilateral 08/21/2016.  Finding' dictated in HPI.   ASSESSMENT/PLAN Cancer  Staging Ductal carcinoma in situ (DCIS) of left breast Staging form: Breast, AJCC 8th Edition - Clinical stage from 09/21/2016: Stage 0 (cTis (DCIS), cN0, cM0, ER: Positive, PR: Positive, HER2: Negative) - Signed by Earlie Server, MD on 10/22/2016 - Pathologic stage from 10/16/2016: Stage 0 (pTis (DCIS), pN0(sn), cM0, ER: Positive, PR: Positive, HER2: Negative) - Signed by Earlie Server, MD on 10/22/2016  . 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 S/P breast lumpectomy     Reviewed surgical pathology results with patient. She has DCIS status post lumpectomy. Superior margin is negative but close. I recommend her to follow-up with Dr. Tamala Julian to discuss about reexcision if possible. 2 mm margin is recommended as it is associated with reduced risk of ipsilateral tumor recurrence. She will need to see radiation oncology for RT. Refer to Dr.Chrystal.  Once she finished that we'll start her on aromatase inhibitor.    All questions were answered. The patient knows to call the clinic with any problems, questions or concerns.  Dr. Earlie Server, MD, PhD [facility] at [facility] Pager- [phone] 10/22/2016

## 2016-10-23 ENCOUNTER — Inpatient Hospital Stay: Payer: BC Managed Care – PPO | Attending: Oncology | Admitting: Oncology

## 2016-10-23 VITALS — BP 138/79 | HR 76 | Temp 98.4°F | Resp 16 | Wt 145.2 lb

## 2016-10-23 DIAGNOSIS — Z170 Estrogen receptor positive status [ER+]: Secondary | ICD-10-CM | POA: Insufficient documentation

## 2016-10-23 DIAGNOSIS — E039 Hypothyroidism, unspecified: Secondary | ICD-10-CM | POA: Insufficient documentation

## 2016-10-23 DIAGNOSIS — D649 Anemia, unspecified: Secondary | ICD-10-CM

## 2016-10-23 DIAGNOSIS — F419 Anxiety disorder, unspecified: Secondary | ICD-10-CM

## 2016-10-23 DIAGNOSIS — Z79899 Other long term (current) drug therapy: Secondary | ICD-10-CM | POA: Diagnosis not present

## 2016-10-23 DIAGNOSIS — M129 Arthropathy, unspecified: Secondary | ICD-10-CM | POA: Insufficient documentation

## 2016-10-23 DIAGNOSIS — D0512 Intraductal carcinoma in situ of left breast: Secondary | ICD-10-CM | POA: Insufficient documentation

## 2016-10-23 DIAGNOSIS — K219 Gastro-esophageal reflux disease without esophagitis: Secondary | ICD-10-CM | POA: Diagnosis not present

## 2016-10-23 DIAGNOSIS — Z9012 Acquired absence of left breast and nipple: Secondary | ICD-10-CM | POA: Insufficient documentation

## 2016-10-23 DIAGNOSIS — E785 Hyperlipidemia, unspecified: Secondary | ICD-10-CM | POA: Diagnosis not present

## 2016-10-23 DIAGNOSIS — Z9889 Other specified postprocedural states: Secondary | ICD-10-CM

## 2016-10-23 NOTE — Progress Notes (Signed)
Patient here today for follow up.   

## 2016-10-23 NOTE — Addendum Note (Signed)
Addended by: Vernetta Honey on: 10/23/2016 03:22 PM   Modules accepted: Orders

## 2016-11-04 ENCOUNTER — Encounter: Payer: Self-pay | Admitting: Radiation Oncology

## 2016-11-04 ENCOUNTER — Ambulatory Visit
Admission: RE | Admit: 2016-11-04 | Discharge: 2016-11-04 | Disposition: A | Discharge status: Home / Self Care | Payer: BC Managed Care – PPO | Source: Ambulatory Visit | Attending: Radiation Oncology | Admitting: Radiation Oncology

## 2016-11-04 VITALS — BP 142/79 | HR 75 | Temp 97.7°F | Resp 18 | Wt 146.4 lb

## 2016-11-04 DIAGNOSIS — Z170 Estrogen receptor positive status [ER+]: Secondary | ICD-10-CM | POA: Insufficient documentation

## 2016-11-04 DIAGNOSIS — M129 Arthropathy, unspecified: Secondary | ICD-10-CM | POA: Diagnosis not present

## 2016-11-04 DIAGNOSIS — Z510 Encounter for antineoplastic radiation therapy: Secondary | ICD-10-CM | POA: Diagnosis not present

## 2016-11-04 DIAGNOSIS — D649 Anemia, unspecified: Secondary | ICD-10-CM | POA: Insufficient documentation

## 2016-11-04 DIAGNOSIS — Z9012 Acquired absence of left breast and nipple: Secondary | ICD-10-CM | POA: Diagnosis not present

## 2016-11-04 DIAGNOSIS — Z79899 Other long term (current) drug therapy: Secondary | ICD-10-CM | POA: Diagnosis not present

## 2016-11-04 DIAGNOSIS — E785 Hyperlipidemia, unspecified: Secondary | ICD-10-CM | POA: Insufficient documentation

## 2016-11-04 DIAGNOSIS — E039 Hypothyroidism, unspecified: Secondary | ICD-10-CM | POA: Diagnosis not present

## 2016-11-04 DIAGNOSIS — D0512 Intraductal carcinoma in situ of left breast: Secondary | ICD-10-CM | POA: Diagnosis not present

## 2016-11-04 DIAGNOSIS — Z8719 Personal history of other diseases of the digestive system: Secondary | ICD-10-CM | POA: Insufficient documentation

## 2016-11-04 NOTE — Consult Note (Signed)
NEW PATIENT EVALUATION  Name: Mallory Leblanc  MRN: [id number]  Date:   11/04/2016     DOB: [birth year]   This 66 y.o. female patient presents to the clinic for initial evaluation of ER/PR positive ductal carcinoma in situ of the left breast status post wide local excision and sentinel node biopsy stage 0 (Tis N0 M0).  REFERRING PHYSICIAN: Pleas Koch, NP  CHIEF COMPLAINT:  Chief Complaint  Patient presents with  . Breast Cancer    Pt is here for initial consultation of breast cancer.      DIAGNOSIS: The encounter diagnosis was Ductal carcinoma in situ (DCIS) of left breast.   PREVIOUS INVESTIGATIONS:  Mammograms ultrasound reviewed Pathology reports reviewed Clinical notes reviewed  HPI: Patient is a 66 year old female who presented with an abnormal mammogram of her left breast showing pleomorphic calcifications in the upper outer quadrant of the left breast. This prompted ultrasound-guided biopsy which was positive for ductal carcinoma in situ. She then underwent a wide local excision by Dr. Thompson Caul showing one sentinel lymph node negative for malignancy. There was at least 7 mm of residual ductal carcinoma in situ overall nuclear grade 2. Margins were clear but close at 0.1 mm. Tumor was excised strongly ER/PR positive. She has done well postoperatively. She is seen medical oncology who is recommending tamoxifen for 5 years. She also revisited Dr. Tamala Julian for contemplation of possible reexcision although he has deferred that for my opinion.  PLANNED TREATMENT REGIMEN: Left hypofractionated whole breast radiation  PAST MEDICAL HISTORY:  has a past medical history of Anemia; Arthritis; Cancer ([place]); Genital warts; GERD (gastroesophageal reflux disease); Hyperlipidemia; and Hypothyroidism.    PAST SURGICAL HISTORY:  Past Surgical History:  Procedure Laterality Date  . APPENDECTOMY    . BREAST BIOPSY Left 09/21/2016   Left Affirm Bx- Path pending  . cyst     on the left ovary  early 80's  . PARTIAL MASTECTOMY WITH NEEDLE LOCALIZATION Left 10/16/2016   Procedure: PARTIAL MASTECTOMY WITH NEEDLE LOCALIZATION;  Surgeon: Leonie Green, MD;  Location: [facility] ORS;  Service: General;  Laterality: Left;  . SENTINEL NODE BIOPSY Left 10/16/2016   Procedure: SENTINEL NODE BIOPSY;  Surgeon: Leonie Green, MD;  Location: [facility] ORS;  Service: General;  Laterality: Left;  . WRIST FRACTURE SURGERY Right     FAMILY HISTORY: family history includes Heart disease in her father.  SOCIAL HISTORY:  reports that she has never smoked. She has never used smokeless tobacco. She reports that she drinks alcohol. She reports that she does not use drugs.  ALLERGIES: Sulfa antibiotics  MEDICATIONS:  Current Outpatient Prescriptions  Medication Sig Dispense Refill  . acetaminophen (TYLENOL) 500 MG tablet Take 1,000 mg by mouth every 8 (eight) hours as needed for mild pain or moderate pain.    . Cholecalciferol (VITAMIN D) 2000 units tablet Take 2,000 Units by mouth daily.    Marland Kitchen HYDROcodone-acetaminophen (NORCO) 5-325 MG tablet Take 1-2 tablets by mouth every 4 (four) hours as needed for moderate pain. 12 tablet 0  . levothyroxine (SYNTHROID, LEVOTHROID) 100 MCG tablet Take 1 tablet by mouth every morning on an empty stomach with a full glass of water. 90 tablet 1  . omeprazole (PRILOSEC) 20 MG capsule Take 20 mg by mouth every morning.    . simvastatin (ZOCOR) 20 MG tablet Take 30 mg by mouth at bedtime.    . vitamin B-12 (CYANOCOBALAMIN) 1000 MCG tablet Take 1,000 mcg by mouth daily.  No current facility-administered medications for this encounter.     ECOG PERFORMANCE STATUS:  0 - Asymptomatic  REVIEW OF SYSTEMS:  Patient denies any weight loss, fatigue, weakness, fever, chills or night sweats. Patient denies any loss of vision, blurred vision. Patient denies any ringing  of the ears or hearing loss. No irregular heartbeat. Patient denies heart murmur or history of fainting.  Patient denies any chest pain or pain radiating to her upper extremities. Patient denies any shortness of breath, difficulty breathing at night, cough or hemoptysis. Patient denies any swelling in the lower legs. Patient denies any nausea vomiting, vomiting of blood, or coffee ground material in the vomitus. Patient denies any stomach pain. Patient states has had normal bowel movements no significant constipation or diarrhea. Patient denies any dysuria, hematuria or significant nocturia. Patient denies any problems walking, swelling in the joints or loss of balance. Patient denies any skin changes, loss of hair or loss of weight. Patient denies any excessive worrying or anxiety or significant depression. Patient denies any problems with insomnia. Patient denies excessive thirst, polyuria, polydipsia. Patient denies any swollen glands, patient denies easy bruising or easy bleeding. Patient denies any recent infections, allergies or URI. Patient "s visual fields have not changed significantly in recent time.    PHYSICAL EXAM: BP (!) 142/79   Pulse 75   Temp 97.7 F (36.5 C)   Resp 18   Wt 146 lb 6.2 oz (66.4 kg)   BMI 28.59 kg/m  Left breast is wide local excisions well sentinel node biopsy scar both healing well. No dominant mass or nodularity is noted in either breast in 2 positions examined. No axillary or supraclavicular adenopathy is identified. Well-developed well-nourished patient in NAD. HEENT reveals PERLA, EOMI, discs not visualized.  Oral cavity is clear. No oral mucosal lesions are identified. Neck is clear without evidence of cervical or supraclavicular adenopathy. Lungs are clear to A&P. Cardiac examination is essentially unremarkable with regular rate and rhythm without murmur rub or thrill. Abdomen is benign with no organomegaly or masses noted. Motor sensory and DTR levels are equal and symmetric in the upper and lower extremities. Cranial nerves II through XII are grossly intact.  Proprioception is intact. No peripheral adenopathy or edema is identified. No motor or sensory levels are noted. Crude visual fields are within normal range.  LABORATORY DATA: Pathology reports reviewed    RADIOLOGY RESULTS: Mammograms reviewed   IMPRESSION: ER/PR positive ductal carcinoma in situ of the left breast status post wide local excision with close margin ER/PR positive in 66 year old female  PLAN: Based on the patient's discussion with the surgeon he is reluctant to do reexcision and patient really does not want further surgery. I believe treating the whole breast in 4 weeks for the hypofractionated course of treatment plus boosting the scar another 1600 cGy using electron beam will be sufficient to control possible recurrent disease in the left breast. Patient also will be candidate for 5 years of tamoxifen. Risks and benefits of radiation including skin reaction fatigue alteration of blood counts possible inclusion of superficial lung all were discussed in detail with the patient. She and her husband both seem to comprehend my treatment plan well. I've person set up and ordered CT simulation for approximately 2 weeks as the patient has a vacation plans ahead of her.  I would like to take this opportunity to thank you for allowing me to participate in the care of your patient.Armstead Peaks., MD

## 2016-11-04 NOTE — Op Note (Signed)
OPERATIVE REPORT  Addendum for 10-16-16  PREOPERATIVE  DIAGNOSIS: .Left  breast cancer  POSTOPERATIVE DIAGNOSIS: . Left breast cancer  PROCEDURE: . Left partial mastectomy, sentinel lymph node biopsy  ANESTHESIA:  General  SURGEON: Rochel Brome  MD   INDICATIONS: . She had recent mammogram depicting a cluster of microcalcifications in the upper outer quadrant of the left breast. Core needle biopsy demonstrated ductal carcinoma in situ. Surgery was recommended for definitive treatment. Sentinel lymph node was recommended due to the fact that the cancer is in the upper outer quadrant. The patient did have preoperative injection of radioactive technetium sulfur colloid. She also had insertion of a Kopan's wire. Mammogram images were reviewed prior to incision.  With the patient on the operating table in the supine position she was placed under general anesthesia. The dressing was removed from the left breast exposing the Kopan's wire which entered the upper outer quadrant. The wire was cut 2 cm from the skin. The site was prepared with ChloraPrep and draped in a sterile manner.  A curvilinear incision was made in the upper outer quadrant 6.5 cm from the nipple and carried down through subcutaneous tissues. The wire was encountered. Dissection was carried out to remove a mass of tissue surrounding the distal portion of the wire. As the tissue was being dissected sutures were used to attach markers to label the medial lateral cranial caudal superficial and deep margins. The specimen was submitted for specimen mammogram and pathology. The specimen mammogram demonstrated the location of the biopsy marker and calcifications in the specimen. The pathologist called to report that the margins appeared to be satisfactory.  The axilla was probed with a gamma counter demonstrating the location of radioactivity in the inferior aspect of the axilla. An oblique incision was made up proximally 4 cm in  length and carried down through subcutaneous tissues deeply within the axilla locating a lymph node with radioactivity. This lymph node was approximately 8 mm in dimension and was dissected free from surrounding tissues. The ex vivo count was in the range of 800- 900 counts per second. The background count was in the range of 3-17 counts per second. There was no remaining palpable mass within the axilla. The wound was inspected and hemostasis was intact. Subcuticular tissues were infiltrated with half percent Sensorcaine with epinephrine. The wound was closed with running 4-0 Monocryl subcuticular suture.  The breast wound was further inspected and several small bleeding points cauterized. Tissues were infiltrated with half percent Sensorcaine with epinephrine. Subcutaneous tissues were closed with interrupted 3-0 chromic sutures. The skin was closed with running 4-0 Monocryl subcutaneous suture. Both wounds were treated with Dermabond and allowed to dry. The patient tolerated surgery satisfactorily and was then prepared for transfer to the recovery room  Rochel Brome M.D.      Electronically signed by Leonie Green, MD at 10/16/2016 3:07 PM      Admission (Discharged) on 10/16/2016        Routing History        Detailed Report

## 2016-11-05 ENCOUNTER — Encounter: Payer: Self-pay | Admitting: Primary Care

## 2016-11-12 ENCOUNTER — Ambulatory Visit
Admission: RE | Admit: 2016-11-12 | Discharge: 2016-11-12 | Disposition: A | Discharge status: Home / Self Care | Payer: BC Managed Care – PPO | Source: Ambulatory Visit | Attending: Radiation Oncology | Admitting: Radiation Oncology

## 2016-11-12 DIAGNOSIS — D0512 Intraductal carcinoma in situ of left breast: Secondary | ICD-10-CM | POA: Diagnosis not present

## 2016-11-13 ENCOUNTER — Ambulatory Visit: Payer: BC Managed Care – PPO | Admitting: Oncology

## 2016-11-13 ENCOUNTER — Inpatient Hospital Stay: Payer: BC Managed Care – PPO | Admitting: Oncology

## 2016-11-16 ENCOUNTER — Encounter: Payer: Self-pay | Admitting: Family Medicine

## 2016-11-16 ENCOUNTER — Ambulatory Visit (INDEPENDENT_AMBULATORY_CARE_PROVIDER_SITE_OTHER): Payer: BC Managed Care – PPO | Admitting: Family Medicine

## 2016-11-16 VITALS — BP 130/74 | HR 68 | Temp 98.6°F | Ht 60.0 in | Wt 144.5 lb

## 2016-11-16 DIAGNOSIS — M1712 Unilateral primary osteoarthritis, left knee: Secondary | ICD-10-CM

## 2016-11-16 DIAGNOSIS — D0512 Intraductal carcinoma in situ of left breast: Secondary | ICD-10-CM | POA: Diagnosis not present

## 2016-11-16 MED ORDER — HYLAN G-F 20 48 MG/6ML IX SOSY
48.0000 mg | PREFILLED_SYRINGE | Freq: Once | INTRA_ARTICULAR | Status: AC
  Administered 2016-11-16: 48 mg via INTRA_ARTICULAR

## 2016-11-16 NOTE — Progress Notes (Signed)
Dr. Frederico Hamman T. Geselle Cardosa, MD, [facility] [place], [address] Phone: [phone] Fax: [phone]  11/16/2016  Patient: Mallory Leblanc, MRN: [id number], DOB: 08-Jun-1950, 66 y.o.  Primary Physician:  Pleas Koch, NP   Chief Complaint  Patient presents with  . Knee Pain    Left   Subjective:   Mallory Leblanc is a 66 y.o. very pleasant female patient who presents with the following:  Had synvisc about 4 year ago. She reports that this was done at [facility] clinic.  She had an excellent response to this, and had a relief of symptoms for approaching 4 years.  Over the last few months she has had some increased pain in her knee as well some crepitus.  No trauma or accident.  On her MRI from approximately 4 years ago on the left knee, patient had some apparent osteoarthritis and clear medial compartmental partial thickness cartilage loss.  Synvisc injection on the Left.  Past Medical History, Surgical History, Social History, Family History, Problem List, Medications, and Allergies have been reviewed and updated if relevant.  Patient Active Problem List   Diagnosis Date Noted  . Ductal carcinoma in situ (DCIS) of left breast 09/29/2016  . Hypothyroidism 08/25/2016  . Hyperlipidemia 08/25/2016  . Knee pain 05/16/2012    Past Medical History:  Diagnosis Date  . Anemia   . Arthritis    LEFT KNEE  . Cancer ([place])   . Genital warts   . GERD (gastroesophageal reflux disease)    OCC  . Hyperlipidemia   . Hypothyroidism     Past Surgical History:  Procedure Laterality Date  . APPENDECTOMY    . BREAST BIOPSY Left 09/21/2016   Left Affirm Bx- Path pending  . cyst     on the left ovary early 80's  . PARTIAL MASTECTOMY WITH NEEDLE LOCALIZATION Left 10/16/2016   Procedure: PARTIAL MASTECTOMY WITH NEEDLE LOCALIZATION;  Surgeon: Leonie Green, MD;  Location: [facility] ORS;  Service: General;  Laterality: Left;  .  SENTINEL NODE BIOPSY Left 10/16/2016   Procedure: SENTINEL NODE BIOPSY;  Surgeon: Leonie Green, MD;  Location: [facility] ORS;  Service: General;  Laterality: Left;  . WRIST FRACTURE SURGERY Right     Social History   Social History  . Marital status: Married    Spouse name: N/A  . Number of children: N/A  . Years of education: N/A   Occupational History  . Not on file.   Social History Main Topics  . Smoking status: Never Smoker  . Smokeless tobacco: Never Used  . Alcohol use Yes     Comment: BEER OCC  . Drug use: No  . Sexual activity: Not on file   Other Topics Concern  . Not on file   Social History Narrative   Married.   1 child.    Works as a Optometrist.   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    Family History  Problem Relation Age of Onset  . Heart disease Father     Allergies  Allergen Reactions  . Sulfa Antibiotics Rash    Medication list reviewed and updated in full in [place].  GEN: No fevers, chills. Nontoxic. Primarily MSK c/o today. MSK: Detailed in the HPI GI: tolerating PO intake without difficulty Neuro: No numbness, parasthesias, or tingling associated. Otherwise the pertinent positives of the ROS are noted above.   Objective:  BP 130/74   Pulse 68   Temp 98.6 F (37 C) (Oral)   Ht 5' (1.524 m)   Wt 144 lb 8 oz (65.5 kg)   BMI 28.22 kg/m    GEN: WDWN, NAD, Non-toxic, Alert & Oriented x 3 HEENT: Atraumatic, Normocephalic.  Ears and Nose: No external deformity. EXTR: No clubbing/cyanosis/edema NEURO: Normal gait.  PSYCH: Normally interactive. Conversant. Not depressed or anxious appearing.  Calm demeanor.   Knee:  L Gait: Normal heel toe pattern ROM: 0-120 + crepitus Effusion: neg Echymosis or edema: none Patellar tendon NT Painful PLICA: neg Patellar grind: pos Medial and lateral patellar facet loading: negative medial and lateral joint lines: medial joint line  pain Mcmurray's neg Flexion-pinch + Varus and valgus stress: stable Lachman: neg Ant and Post drawer: neg Hip abduction, IR, ER: WNL Hip flexion str: 5/5 Hip abd: 5/5 Quad: 5/5 VMO atrophy:No Hamstring concentric and eccentric: 5/5   Radiology: No results found.  Assessment and Plan:   Primary osteoarthritis of left knee - Plan: Hylan SOSY 48 mg  Known OA of L knee.  We talked about different options that would to try including corticosteroid versus hyaluronic acid.  The patient had such a good response to prior hyaluronic acid injections that would both decided that it would be a good idea to repeat these.  Knee Injection: Synvisc-One, LEFT Patient verbally consented to procedure. Risks (including infection), benefits, and alternatives explained. Sterilely prepped with Chloraprep. Ethyl cholride used for anesthesia, then 7 cc of Lidocaine 1% used for anesthesia in the anterolateral position. Reprepped with Chloraprep.  Anterolateral approach used to inject joint without difficulty, injected with Synvisc-One, 6 mL. No complications with procedure and tolerated well.   Follow-up: No Follow-up on file.  Meds ordered this encounter  Medications  . Hylan SOSY 48 mg    Signed,  Anilah Huck T. Ramzi Brathwaite, MD   Patient's Medications  New Prescriptions   No medications on file  Previous Medications   ACETAMINOPHEN (TYLENOL) 500 MG TABLET    Take 1,000 mg by mouth every 8 (eight) hours as needed for mild pain or moderate pain.   CHOLECALCIFEROL (VITAMIN D) 2000 UNITS TABLET    Take 2,000 Units by mouth daily.   HYDROCODONE-ACETAMINOPHEN (NORCO) 5-325 MG TABLET    Take 1-2 tablets by mouth every 4 (four) hours as needed for moderate pain.   LEVOTHYROXINE (SYNTHROID, LEVOTHROID) 100 MCG TABLET    Take 1 tablet by mouth every morning on an empty stomach with a full glass of water.   OMEPRAZOLE (PRILOSEC) 20 MG CAPSULE    Take 20 mg by mouth every morning.   SIMVASTATIN (ZOCOR) 20 MG TABLET     Take 30 mg by mouth at bedtime.   VITAMIN B-12 (CYANOCOBALAMIN) 1000 MCG TABLET    Take 1,000 mcg by mouth daily.  Modified Medications   No medications on file  Discontinued Medications   No medications on file

## 2016-11-20 ENCOUNTER — Other Ambulatory Visit: Payer: Self-pay | Admitting: *Deleted

## 2016-11-20 DIAGNOSIS — D0512 Intraductal carcinoma in situ of left breast: Secondary | ICD-10-CM

## 2016-11-23 ENCOUNTER — Ambulatory Visit
Admission: RE | Admit: 2016-11-23 | Discharge: 2016-11-23 | Disposition: A | Discharge status: Home / Self Care | Payer: BC Managed Care – PPO | Source: Ambulatory Visit | Attending: Radiation Oncology | Admitting: Radiation Oncology

## 2016-11-23 DIAGNOSIS — D0512 Intraductal carcinoma in situ of left breast: Secondary | ICD-10-CM | POA: Diagnosis not present

## 2016-11-24 ENCOUNTER — Other Ambulatory Visit: Payer: BC Managed Care – PPO

## 2016-11-24 ENCOUNTER — Ambulatory Visit
Admission: RE | Admit: 2016-11-24 | Discharge: 2016-11-24 | Disposition: A | Discharge status: Home / Self Care | Payer: BC Managed Care – PPO | Source: Ambulatory Visit | Attending: Radiation Oncology | Admitting: Radiation Oncology

## 2016-11-24 DIAGNOSIS — D0512 Intraductal carcinoma in situ of left breast: Secondary | ICD-10-CM | POA: Diagnosis not present

## 2016-11-25 ENCOUNTER — Ambulatory Visit
Admission: RE | Admit: 2016-11-25 | Discharge: 2016-11-25 | Disposition: A | Discharge status: Home / Self Care | Payer: BC Managed Care – PPO | Source: Ambulatory Visit | Attending: Radiation Oncology | Admitting: Radiation Oncology

## 2016-11-25 DIAGNOSIS — D0512 Intraductal carcinoma in situ of left breast: Secondary | ICD-10-CM | POA: Diagnosis not present

## 2016-11-26 ENCOUNTER — Ambulatory Visit
Admission: RE | Admit: 2016-11-26 | Discharge: 2016-11-26 | Disposition: A | Discharge status: Home / Self Care | Payer: BC Managed Care – PPO | Source: Ambulatory Visit | Attending: Radiation Oncology | Admitting: Radiation Oncology

## 2016-11-26 DIAGNOSIS — D0512 Intraductal carcinoma in situ of left breast: Secondary | ICD-10-CM | POA: Diagnosis not present

## 2016-11-27 ENCOUNTER — Ambulatory Visit
Admission: RE | Admit: 2016-11-27 | Discharge: 2016-11-27 | Disposition: A | Discharge status: Home / Self Care | Payer: BC Managed Care – PPO | Source: Ambulatory Visit | Attending: Radiation Oncology | Admitting: Radiation Oncology

## 2016-11-27 DIAGNOSIS — D0512 Intraductal carcinoma in situ of left breast: Secondary | ICD-10-CM | POA: Diagnosis not present

## 2016-11-30 ENCOUNTER — Ambulatory Visit
Admission: RE | Admit: 2016-11-30 | Discharge: 2016-11-30 | Disposition: A | Discharge status: Home / Self Care | Payer: BC Managed Care – PPO | Source: Ambulatory Visit | Attending: Radiation Oncology | Admitting: Radiation Oncology

## 2016-11-30 DIAGNOSIS — D0512 Intraductal carcinoma in situ of left breast: Secondary | ICD-10-CM | POA: Diagnosis not present

## 2016-12-01 ENCOUNTER — Ambulatory Visit
Admission: RE | Admit: 2016-12-01 | Discharge: 2016-12-01 | Disposition: A | Discharge status: Home / Self Care | Payer: BC Managed Care – PPO | Source: Ambulatory Visit | Attending: Radiation Oncology | Admitting: Radiation Oncology

## 2016-12-01 DIAGNOSIS — D0512 Intraductal carcinoma in situ of left breast: Secondary | ICD-10-CM | POA: Diagnosis not present

## 2016-12-02 ENCOUNTER — Ambulatory Visit
Admission: RE | Admit: 2016-12-02 | Discharge: 2016-12-02 | Disposition: A | Discharge status: Home / Self Care | Payer: BC Managed Care – PPO | Source: Ambulatory Visit | Attending: Radiation Oncology | Admitting: Radiation Oncology

## 2016-12-02 DIAGNOSIS — D0512 Intraductal carcinoma in situ of left breast: Secondary | ICD-10-CM | POA: Diagnosis not present

## 2016-12-03 ENCOUNTER — Other Ambulatory Visit: Payer: Self-pay | Admitting: *Deleted

## 2016-12-03 ENCOUNTER — Ambulatory Visit
Admission: RE | Admit: 2016-12-03 | Discharge: 2016-12-03 | Disposition: A | Discharge status: Home / Self Care | Payer: BC Managed Care – PPO | Source: Ambulatory Visit | Attending: Radiation Oncology | Admitting: Radiation Oncology

## 2016-12-03 DIAGNOSIS — D0512 Intraductal carcinoma in situ of left breast: Secondary | ICD-10-CM | POA: Diagnosis not present

## 2016-12-03 MED ORDER — KETOCONAZOLE 2 % EX CREA: 1.0000 "application " | TOPICAL_CREAM | Freq: Two times a day (BID) | CUTANEOUS | 0 refills | Status: DC

## 2016-12-04 ENCOUNTER — Ambulatory Visit
Admission: RE | Admit: 2016-12-04 | Discharge: 2016-12-04 | Disposition: A | Discharge status: Home / Self Care | Payer: BC Managed Care – PPO | Source: Ambulatory Visit | Attending: Radiation Oncology | Admitting: Radiation Oncology

## 2016-12-04 DIAGNOSIS — D0512 Intraductal carcinoma in situ of left breast: Secondary | ICD-10-CM | POA: Diagnosis not present

## 2016-12-07 ENCOUNTER — Ambulatory Visit
Admission: RE | Admit: 2016-12-07 | Discharge: 2016-12-07 | Disposition: A | Discharge status: Home / Self Care | Payer: BC Managed Care – PPO | Source: Ambulatory Visit | Attending: Radiation Oncology | Admitting: Radiation Oncology

## 2016-12-07 DIAGNOSIS — D0512 Intraductal carcinoma in situ of left breast: Secondary | ICD-10-CM | POA: Diagnosis not present

## 2016-12-08 ENCOUNTER — Ambulatory Visit
Admission: RE | Admit: 2016-12-08 | Discharge: 2016-12-08 | Disposition: A | Discharge status: Home / Self Care | Payer: BC Managed Care – PPO | Source: Ambulatory Visit | Attending: Radiation Oncology | Admitting: Radiation Oncology

## 2016-12-08 DIAGNOSIS — D0512 Intraductal carcinoma in situ of left breast: Secondary | ICD-10-CM | POA: Diagnosis not present

## 2016-12-09 ENCOUNTER — Ambulatory Visit
Admission: RE | Admit: 2016-12-09 | Discharge: 2016-12-09 | Disposition: A | Discharge status: Home / Self Care | Payer: BC Managed Care – PPO | Source: Ambulatory Visit | Attending: Radiation Oncology | Admitting: Radiation Oncology

## 2016-12-09 ENCOUNTER — Inpatient Hospital Stay: Payer: BC Managed Care – PPO | Attending: Radiation Oncology

## 2016-12-09 DIAGNOSIS — D0512 Intraductal carcinoma in situ of left breast: Secondary | ICD-10-CM | POA: Insufficient documentation

## 2016-12-09 LAB — CBC
HCT: 32 % — ABNORMAL LOW (ref 35.0–47.0)
Hemoglobin: 11 g/dL — ABNORMAL LOW (ref 12.0–16.0)
MCH: 30.9 pg (ref 26.0–34.0)
MCHC: 34.4 g/dL (ref 32.0–36.0)
MCV: 90 fL (ref 80.0–100.0)
PLATELETS: 230 10*3/uL (ref 150–440)
RBC: 3.56 MIL/uL — ABNORMAL LOW (ref 3.80–5.20)
RDW: 13.8 % (ref 11.5–14.5)
WBC: 6.7 10*3/uL (ref 3.6–11.0)

## 2016-12-10 ENCOUNTER — Ambulatory Visit
Admission: RE | Admit: 2016-12-10 | Discharge: 2016-12-10 | Disposition: A | Discharge status: Home / Self Care | Payer: BC Managed Care – PPO | Source: Ambulatory Visit | Attending: Radiation Oncology | Admitting: Radiation Oncology

## 2016-12-10 DIAGNOSIS — D0512 Intraductal carcinoma in situ of left breast: Secondary | ICD-10-CM | POA: Diagnosis not present

## 2016-12-11 ENCOUNTER — Ambulatory Visit
Admission: RE | Admit: 2016-12-11 | Discharge: 2016-12-11 | Disposition: A | Discharge status: Home / Self Care | Payer: BC Managed Care – PPO | Source: Ambulatory Visit | Attending: Radiation Oncology | Admitting: Radiation Oncology

## 2016-12-11 DIAGNOSIS — D0512 Intraductal carcinoma in situ of left breast: Secondary | ICD-10-CM | POA: Diagnosis not present

## 2016-12-14 ENCOUNTER — Ambulatory Visit
Admission: RE | Admit: 2016-12-14 | Discharge: 2016-12-14 | Disposition: A | Discharge status: Home / Self Care | Payer: BC Managed Care – PPO | Source: Ambulatory Visit | Attending: Radiation Oncology | Admitting: Radiation Oncology

## 2016-12-14 DIAGNOSIS — D0512 Intraductal carcinoma in situ of left breast: Secondary | ICD-10-CM | POA: Diagnosis not present

## 2016-12-15 ENCOUNTER — Ambulatory Visit
Admission: RE | Admit: 2016-12-15 | Discharge: 2016-12-15 | Disposition: A | Discharge status: Home / Self Care | Payer: BC Managed Care – PPO | Source: Ambulatory Visit | Attending: Radiation Oncology | Admitting: Radiation Oncology

## 2016-12-15 DIAGNOSIS — D0512 Intraductal carcinoma in situ of left breast: Secondary | ICD-10-CM | POA: Diagnosis not present

## 2016-12-16 ENCOUNTER — Ambulatory Visit
Admission: RE | Admit: 2016-12-16 | Discharge: 2016-12-16 | Disposition: A | Discharge status: Home / Self Care | Payer: BC Managed Care – PPO | Source: Ambulatory Visit | Attending: Radiation Oncology | Admitting: Radiation Oncology

## 2016-12-16 DIAGNOSIS — D0512 Intraductal carcinoma in situ of left breast: Secondary | ICD-10-CM | POA: Diagnosis not present

## 2016-12-17 ENCOUNTER — Ambulatory Visit
Admission: RE | Admit: 2016-12-17 | Discharge: 2016-12-17 | Disposition: A | Discharge status: Home / Self Care | Payer: BC Managed Care – PPO | Source: Ambulatory Visit | Attending: Radiation Oncology | Admitting: Radiation Oncology

## 2016-12-17 DIAGNOSIS — D0512 Intraductal carcinoma in situ of left breast: Secondary | ICD-10-CM | POA: Diagnosis not present

## 2016-12-18 ENCOUNTER — Ambulatory Visit
Admission: RE | Admit: 2016-12-18 | Discharge: 2016-12-18 | Disposition: A | Discharge status: Home / Self Care | Payer: BC Managed Care – PPO | Source: Ambulatory Visit | Attending: Radiation Oncology | Admitting: Radiation Oncology

## 2016-12-18 DIAGNOSIS — D0512 Intraductal carcinoma in situ of left breast: Secondary | ICD-10-CM | POA: Diagnosis not present

## 2016-12-21 ENCOUNTER — Ambulatory Visit
Admission: RE | Admit: 2016-12-21 | Discharge: 2016-12-21 | Disposition: A | Discharge status: Home / Self Care | Payer: BC Managed Care – PPO | Source: Ambulatory Visit | Attending: Radiation Oncology | Admitting: Radiation Oncology

## 2016-12-21 DIAGNOSIS — D0512 Intraductal carcinoma in situ of left breast: Secondary | ICD-10-CM | POA: Diagnosis not present

## 2016-12-22 ENCOUNTER — Ambulatory Visit
Admission: RE | Admit: 2016-12-22 | Discharge: 2016-12-22 | Disposition: A | Discharge status: Home / Self Care | Payer: BC Managed Care – PPO | Source: Ambulatory Visit | Attending: Radiation Oncology | Admitting: Radiation Oncology

## 2016-12-22 DIAGNOSIS — D0512 Intraductal carcinoma in situ of left breast: Secondary | ICD-10-CM | POA: Diagnosis not present

## 2016-12-23 ENCOUNTER — Inpatient Hospital Stay: Payer: BC Managed Care – PPO

## 2016-12-23 ENCOUNTER — Ambulatory Visit
Admission: RE | Admit: 2016-12-23 | Discharge: 2016-12-23 | Disposition: A | Discharge status: Home / Self Care | Payer: BC Managed Care – PPO | Source: Ambulatory Visit | Attending: Radiation Oncology | Admitting: Radiation Oncology

## 2016-12-23 DIAGNOSIS — D0512 Intraductal carcinoma in situ of left breast: Secondary | ICD-10-CM | POA: Diagnosis not present

## 2016-12-24 ENCOUNTER — Ambulatory Visit
Admission: RE | Admit: 2016-12-24 | Discharge: 2016-12-24 | Disposition: A | Discharge status: Home / Self Care | Payer: BC Managed Care – PPO | Source: Ambulatory Visit | Attending: Radiation Oncology | Admitting: Radiation Oncology

## 2016-12-24 ENCOUNTER — Other Ambulatory Visit: Payer: Self-pay | Admitting: Primary Care

## 2016-12-24 DIAGNOSIS — E785 Hyperlipidemia, unspecified: Secondary | ICD-10-CM

## 2016-12-24 DIAGNOSIS — D0512 Intraductal carcinoma in situ of left breast: Secondary | ICD-10-CM | POA: Diagnosis not present

## 2016-12-24 DIAGNOSIS — E039 Hypothyroidism, unspecified: Secondary | ICD-10-CM

## 2016-12-25 ENCOUNTER — Ambulatory Visit
Admission: RE | Admit: 2016-12-25 | Discharge: 2016-12-25 | Disposition: A | Discharge status: Home / Self Care | Payer: BC Managed Care – PPO | Source: Ambulatory Visit | Attending: Radiation Oncology | Admitting: Radiation Oncology

## 2016-12-25 DIAGNOSIS — D0512 Intraductal carcinoma in situ of left breast: Secondary | ICD-10-CM | POA: Diagnosis not present

## 2017-01-04 ENCOUNTER — Other Ambulatory Visit (INDEPENDENT_AMBULATORY_CARE_PROVIDER_SITE_OTHER): Payer: BC Managed Care – PPO

## 2017-01-04 DIAGNOSIS — E785 Hyperlipidemia, unspecified: Secondary | ICD-10-CM | POA: Diagnosis not present

## 2017-01-04 DIAGNOSIS — E039 Hypothyroidism, unspecified: Secondary | ICD-10-CM | POA: Diagnosis not present

## 2017-01-04 LAB — LIPID PANEL
CHOL/HDL RATIO: 4
CHOLESTEROL: 204 mg/dL — AB (ref 0–200)
HDL: 53.3 mg/dL (ref >39.00)
NonHDL: 150.57
Triglycerides: 267 mg/dL — ABNORMAL HIGH (ref 0.0–149.0)
VLDL: 53.4 mg/dL — AB (ref 0.0–40.0)

## 2017-01-04 LAB — T4, FREE: Free T4: 0.74 ng/dL (ref 0.60–1.60)

## 2017-01-04 LAB — LDL CHOLESTEROL, DIRECT: LDL DIRECT: 106 mg/dL

## 2017-01-04 LAB — TSH: TSH: 0.46 u[IU]/mL (ref 0.35–4.50)

## 2017-01-27 ENCOUNTER — Encounter: Payer: Self-pay | Admitting: Radiation Oncology

## 2017-01-27 ENCOUNTER — Other Ambulatory Visit: Payer: Self-pay

## 2017-01-27 ENCOUNTER — Ambulatory Visit: Payer: BC Managed Care – PPO | Admitting: Radiation Oncology

## 2017-01-27 ENCOUNTER — Ambulatory Visit
Admission: RE | Admit: 2017-01-27 | Discharge: 2017-01-27 | Disposition: A | Discharge status: Home / Self Care | Payer: BC Managed Care – PPO | Source: Ambulatory Visit | Attending: Radiation Oncology | Admitting: Radiation Oncology

## 2017-01-27 VITALS — BP 153/81 | HR 72 | Temp 97.1°F | Resp 20 | Wt 147.2 lb

## 2017-01-27 DIAGNOSIS — Z170 Estrogen receptor positive status [ER+]: Secondary | ICD-10-CM | POA: Insufficient documentation

## 2017-01-27 DIAGNOSIS — Z923 Personal history of irradiation: Secondary | ICD-10-CM | POA: Diagnosis not present

## 2017-01-27 DIAGNOSIS — D0512 Intraductal carcinoma in situ of left breast: Secondary | ICD-10-CM

## 2017-01-27 NOTE — Progress Notes (Signed)
Radiation Oncology Follow up Note  Name: Mallory Leblanc   Date:   01/27/2017 MRN:  [id number] DOB: [birth year] [facility]    This 66 y.o. female presents to the clinic today for one-month follow-up status post whole breast radiation to her left breast for ductal carcinoma in situ  REFERRING PROVIDER: Pleas Koch, NP  HPI: Patient is a 66 year old female now 1 month out having completed whole breast radiation to her left breast for ductal carcinoma in situ. ER/PR positive. Seen today in routine follow-up she is doing well. She specifically denies breast tenderness cough or bone pain. She's not see medical oncology and is not been started on antiestrogen therapy.  COMPLICATIONS OF TREATMENT: none  FOLLOW UP COMPLIANCE: keeps appointments   PHYSICAL EXAM:  BP (!) 153/81   Pulse 72   Temp (!) 97.1 F (36.2 C)   Resp 20   Wt 147 lb 2.5 oz (66.7 kg)   BMI 28.74 kg/m  Lungs are clear to A&P cardiac examination essentially unremarkable with regular rate and rhythm. No dominant mass or nodularity is noted in either breast in 2 positions examined. Incision is well-healed. No axillary or supraclavicular adenopathy is appreciated. Cosmetic result is excellent. Well-developed well-nourished patient in NAD. HEENT reveals PERLA, EOMI, discs not visualized.  Oral cavity is clear. No oral mucosal lesions are identified. Neck is clear without evidence of cervical or supraclavicular adenopathy. Lungs are clear to A&P. Cardiac examination is essentially unremarkable with regular rate and rhythm without murmur rub or thrill. Abdomen is benign with no organomegaly or masses noted. Motor sensory and DTR levels are equal and symmetric in the upper and lower extremities. Cranial nerves II through XII are grossly intact. Proprioception is intact. No peripheral adenopathy or edema is identified. No motor or sensory levels are noted. Crude visual fields are within normal range.  RADIOLOGY RESULTS: No current films  for review  PLAN: At the present time patient is doing well 1 month out from whole breast radiation. I am scheduling her to see medical oncology next week to discuss antiestrogen therapy. I have asked to see her back in 4-5 months for follow-up. Patient knows to call sooner with any concerns.  I would like to take this opportunity to thank you for allowing me to participate in the care of your patient.Armstead Peaks., MD

## 2017-02-04 ENCOUNTER — Encounter: Payer: Self-pay | Admitting: Primary Care

## 2017-02-04 ENCOUNTER — Encounter: Payer: Self-pay | Admitting: Internal Medicine

## 2017-02-04 ENCOUNTER — Ambulatory Visit (INDEPENDENT_AMBULATORY_CARE_PROVIDER_SITE_OTHER): Payer: BC Managed Care – PPO | Admitting: Internal Medicine

## 2017-02-04 VITALS — BP 126/82 | HR 62 | Temp 98.5°F | Wt 145.0 lb

## 2017-02-04 DIAGNOSIS — M545 Low back pain, unspecified: Secondary | ICD-10-CM

## 2017-02-04 DIAGNOSIS — T148XXA Other injury of unspecified body region, initial encounter: Secondary | ICD-10-CM | POA: Diagnosis not present

## 2017-02-04 MED ORDER — IBUPROFEN 800 MG PO TABS: 800.0000 mg | ORAL_TABLET | Freq: Three times a day (TID) | ORAL | 0 refills | Status: DC | PRN

## 2017-02-04 NOTE — Patient Instructions (Signed)

## 2017-02-04 NOTE — Progress Notes (Signed)
Subjective:    Patient ID: Mallory Leblanc, female    DOB: [birth year], 66 y.o.   MRN: [id number]  HPI  Pt presents to the clinic today with c/o right lower back pain. She reports this started yesterday while at work. She describes the pain as sore and achy. The pain radiates into the right hip. She denies radiation into her leg. She denies numbness or tingling in her leg. She denies loss of bowel or bladder. She denies urinary or vaginal complaints. She reports she took 1/2 of a leftover Norco last night and 800 mg of Ibuprofen this morning and her pain has completely resolved. She has tried a heating pad as well. She denies any injury to the area.  Review of Systems  Past Medical History:  Diagnosis Date  . Anemia   . Arthritis    LEFT KNEE  . Cancer ([place])   . Genital warts   . GERD (gastroesophageal reflux disease)    OCC  . Hyperlipidemia   . Hypothyroidism     Current Outpatient Medications  Medication Sig Dispense Refill  . acetaminophen (TYLENOL) 500 MG tablet Take 1,000 mg by mouth every 8 (eight) hours as needed for mild pain or moderate pain.    . Cholecalciferol (VITAMIN D) 2000 units tablet Take 2,000 Units by mouth daily.    Marland Kitchen HYDROcodone-acetaminophen (NORCO) 5-325 MG tablet Take 1-2 tablets by mouth every 4 (four) hours as needed for moderate pain. (Patient not taking: Reported on 01/27/2017) 12 tablet 0  . ketoconazole (NIZORAL) 2 % cream Apply 1 application topically 2 (two) times daily. 15 g 0  . levothyroxine (SYNTHROID, LEVOTHROID) 100 MCG tablet Take 1 tablet by mouth every morning on an empty stomach with a full glass of water. 90 tablet 1  . Omega-3 Fatty Acids (FISH OIL) 1200 MG CAPS Take by mouth.    Marland Kitchen omeprazole (PRILOSEC) 20 MG capsule Take 20 mg by mouth every morning.    . simvastatin (ZOCOR) 20 MG tablet Take 30 mg by mouth at bedtime.    . vitamin B-12 (CYANOCOBALAMIN) 1000 MCG tablet Take 1,000 mcg by mouth daily.     No current facility-administered  medications for this visit.     Allergies  Allergen Reactions  . Sulfa Antibiotics Rash    Family History  Problem Relation Age of Onset  . Heart disease Father     Social History   Socioeconomic History  . Marital status: Married    Spouse name: Not on file  . Number of children: Not on file  . Years of education: Not on file  . Highest education level: Not on file  Social Needs  . Financial resource strain: Not on file  . Food insecurity - worry: Not on file  . Food insecurity - inability: Not on file  . Transportation needs - medical: Not on file  . Transportation needs - non-medical: Not on file  Occupational History  . Not on file  Tobacco Use  . Smoking status: Never Smoker  . Smokeless tobacco: Never Used  Substance and Sexual Activity  . Alcohol use: Yes    Comment: BEER OCC  . Drug use: No  . Sexual activity: Not on file  Other Topics Concern  . Not on file  Social History Narrative   Married.   1 child.    Works as a Optometrist.   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     Constitutional: Denies  fever, malaise, fatigue, headache or abrupt weight changes.   Gastrointestinal: Denies abdominal pain, bloating, constipation, diarrhea or blood in the stool.  GU: Denies urgency, frequency, pain with urination, burning sensation, blood in urine, odor or discharge. Musculoskeletal: Pt reports back pain .Denies decrease in range of motion, difficulty with gait, or joint pain and swelling.    No other specific complaints in a complete review of systems (except as listed in HPI above).     Objective:   Physical Exam   BP 126/82   Pulse 62   Temp 98.5 F (36.9 C) (Oral)   Wt 145 lb (65.8 kg)   SpO2 98%   BMI 28.32 kg/m  Wt Readings from Last 3 Encounters:  02/04/17 145 lb (65.8 kg)  01/27/17 147 lb 2.5 oz (66.7 kg)  11/16/16 144 lb 8 oz (65.5 kg)    General: Appears her stated age, well developed, well  nourished in NAD. Abdomen: Soft and nontender. Normal bowel sounds.  Musculoskeletal: Normal flexion, extension and rotation of the spine. No bony tenderness over the spine. No pain with palpation of the right para lumbar muscles. Normal flexion, extension, internal and external rotation of the right hip. No pain with palpation of the right hip. No difficulty with gait.  Neurological: Alert and oriented.    BMET No results found for: NA, K, CL, CO2, GLUCOSE, BUN, CREATININE, CALCIUM, GFRNONAA, GFRAA  Lipid Panel     Component Value Date/Time   CHOL 204 (H) 01/04/2017 0842   TRIG 267.0 (H) 01/04/2017 0842   HDL 53.30 01/04/2017 0842   CHOLHDL 4 01/04/2017 0842   VLDL 53.4 (H) 01/04/2017 0842    CBC    Component Value Date/Time   WBC 6.7 12/09/2016 1450   RBC 3.56 (L) 12/09/2016 1450   HGB 11.0 (L) 12/09/2016 1450   HCT 32.0 (L) 12/09/2016 1450   PLT 230 12/09/2016 1450   MCV 90.0 12/09/2016 1450   MCH 30.9 12/09/2016 1450   MCHC 34.4 12/09/2016 1450   RDW 13.8 12/09/2016 1450    Hgb A1C No results found for: HGBA1C         Assessment & Plan:   Muscle Strain of Right Lower Back:  Improved Will give her a RX for Ibuprofen 800 mg TID prn, take with food Continue heat Stretching exercises given Work note provided  Return precautions discussed Webb Silversmith, NP

## 2017-02-11 ENCOUNTER — Inpatient Hospital Stay: Payer: BC Managed Care – PPO | Attending: Oncology | Admitting: Oncology

## 2017-02-11 ENCOUNTER — Encounter: Payer: Self-pay | Admitting: Oncology

## 2017-02-11 ENCOUNTER — Inpatient Hospital Stay: Payer: BC Managed Care – PPO

## 2017-02-11 VITALS — BP 165/83 | Temp 98.5°F | Resp 16 | Wt 149.2 lb

## 2017-02-11 DIAGNOSIS — D0512 Intraductal carcinoma in situ of left breast: Secondary | ICD-10-CM

## 2017-02-11 DIAGNOSIS — Z79899 Other long term (current) drug therapy: Secondary | ICD-10-CM | POA: Insufficient documentation

## 2017-02-11 DIAGNOSIS — Z9012 Acquired absence of left breast and nipple: Secondary | ICD-10-CM | POA: Insufficient documentation

## 2017-02-11 DIAGNOSIS — K219 Gastro-esophageal reflux disease without esophagitis: Secondary | ICD-10-CM | POA: Diagnosis not present

## 2017-02-11 DIAGNOSIS — Z79811 Long term (current) use of aromatase inhibitors: Secondary | ICD-10-CM | POA: Insufficient documentation

## 2017-02-11 DIAGNOSIS — Z170 Estrogen receptor positive status [ER+]: Secondary | ICD-10-CM | POA: Insufficient documentation

## 2017-02-11 DIAGNOSIS — E785 Hyperlipidemia, unspecified: Secondary | ICD-10-CM | POA: Diagnosis not present

## 2017-02-11 DIAGNOSIS — E039 Hypothyroidism, unspecified: Secondary | ICD-10-CM | POA: Diagnosis not present

## 2017-02-11 DIAGNOSIS — D649 Anemia, unspecified: Secondary | ICD-10-CM | POA: Insufficient documentation

## 2017-02-11 DIAGNOSIS — Z923 Personal history of irradiation: Secondary | ICD-10-CM | POA: Diagnosis not present

## 2017-02-11 LAB — CBC WITH DIFFERENTIAL/PLATELET
BASOS PCT: 2 %
Basophils Absolute: 0.1 10*3/uL (ref 0–0.1)
EOS ABS: 0.4 10*3/uL (ref 0–0.7)
Eosinophils Relative: 6 %
HCT: 33.1 % — ABNORMAL LOW (ref 35.0–47.0)
HEMOGLOBIN: 11.1 g/dL — AB (ref 12.0–16.0)
Lymphocytes Relative: 28 %
Lymphs Abs: 1.9 10*3/uL (ref 1.0–3.6)
MCH: 30.7 pg (ref 26.0–34.0)
MCHC: 33.6 g/dL (ref 32.0–36.0)
MCV: 91.2 fL (ref 80.0–100.0)
MONOS PCT: 12 %
Monocytes Absolute: 0.8 10*3/uL (ref 0.2–0.9)
NEUTROS PCT: 54 %
Neutro Abs: 3.7 10*3/uL (ref 1.4–6.5)
Platelets: 239 10*3/uL (ref 150–440)
RBC: 3.63 MIL/uL — ABNORMAL LOW (ref 3.80–5.20)
RDW: 13.7 % (ref 11.5–14.5)
WBC: 6.8 10*3/uL (ref 3.6–11.0)

## 2017-02-11 LAB — COMPREHENSIVE METABOLIC PANEL
ALBUMIN: 4.6 g/dL (ref 3.5–5.0)
ALK PHOS: 94 U/L (ref 38–126)
ALT: 19 U/L (ref 14–54)
AST: 21 U/L (ref 15–41)
Anion gap: 8 (ref 5–15)
BUN: 17 mg/dL (ref 6–20)
CALCIUM: 9.5 mg/dL (ref 8.9–10.3)
CHLORIDE: 101 mmol/L (ref 101–111)
CO2: 27 mmol/L (ref 22–32)
CREATININE: 0.61 mg/dL (ref 0.44–1.00)
Glucose, Bld: 103 mg/dL — ABNORMAL HIGH (ref 65–99)
Potassium: 4 mmol/L (ref 3.5–5.1)
SODIUM: 136 mmol/L (ref 135–145)
Total Bilirubin: 0.4 mg/dL (ref 0.3–1.2)
Total Protein: 7.5 g/dL (ref 6.5–8.1)

## 2017-02-11 MED ORDER — LETROZOLE 2.5 MG PO TABS: 2.5000 mg | ORAL_TABLET | Freq: Every day | ORAL | 3 refills | Status: DC

## 2017-02-11 NOTE — Progress Notes (Signed)
[facility] follow up visit Patient Care Team: Pleas Koch, NP as PCP - General (Internal Medicine)  REASON FOR VISIT Follow up for treatment of DCIS  HISTORY OF PRESENTING ILLNESS: Mallory Leblanc 66 y.o. 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 Patient had screening mammogram done on 08/21/2016 with [facility] which revealed grouped calcification that approximately 1 cm in the upper outer quadrant of the left breast 9 cm from nipple which are indicated to terminate a prominent left axillary lymph node is present and is unchanged when compared to the ultrasound of 13 and 2711 studies no other dermatitis is seen in the left breast there are no suspicious masses malignant calcifications site of architecture distortion or concerning asymmetries in the right breast. Patient had diagnostic mammogram unilateral left down on September 09 2006, followed by rest left upper outer quadrant stereotactic biopsy. Pathology showed DCIS intermediate grade, calcifications associated with DCIS. DCIS is present in 4 out of 6 blocks with the largest focus measuring 6 mm. ER more than 90% positive. PR 50-90% positive.  Post lumpectomy pathology showed: DCIS status post lumpectomy. Superior margin is negative but close. 2 mm margin is recommended as it is associated with reduced risk of ipsilateral tumor recurrence. Case was discussed at breast tumor conference and consensus was patient to undergo radiation followed by adjuvant endocrine therapy for 5 years.   She is postmenopausal.  INTERVAL HISTORY Patient presents to discuss about the results. No new complaints. She denies any pain today. She denies cough, sob, nausea, vomiting, fever or chills.    Review of Systems  Constitutional: Negative for appetite change.  HENT:   Negative for hearing loss.   Eyes: Negative for eye  problems.  Respiratory: Negative for chest tightness.   Cardiovascular: Negative for chest pain.  Gastrointestinal: Negative for abdominal distention.  Endocrine: Negative for hot flashes.  Genitourinary: Negative for difficulty urinating.   Musculoskeletal: Negative for arthralgias.  Skin: Negative for itching.  Neurological: Negative for dizziness.  Hematological: Negative for adenopathy.  Psychiatric/Behavioral: The patient is not nervous/anxious.     MEDICAL HISTORY: Past Medical History:  Diagnosis Date  . Anemia   . Arthritis    LEFT KNEE  . Cancer ([place])   . Genital warts   . GERD (gastroesophageal reflux disease)    OCC  . Hyperlipidemia   . Hypothyroidism     SURGICAL HISTORY: Past Surgical History:  Procedure Laterality Date  . APPENDECTOMY    . BREAST BIOPSY Left 09/21/2016   Left Affirm Bx- Path pending  . cyst     on the left ovary early 80's  . PARTIAL MASTECTOMY WITH NEEDLE LOCALIZATION Left 10/16/2016   Procedure: PARTIAL MASTECTOMY WITH NEEDLE LOCALIZATION;  Surgeon: Leonie Green, MD;  Location: [facility] ORS;  Service: General;  Laterality: Left;  . SENTINEL NODE BIOPSY Left 10/16/2016   Procedure: SENTINEL NODE BIOPSY;  Surgeon: Leonie Green, MD;  Location: [facility] ORS;  Service: General;  Laterality: Left;  . WRIST FRACTURE SURGERY Right     SOCIAL HISTORY: Social History   Socioeconomic History  . Marital status: Married    Spouse name: Not on file  . Number of children: Not on file  . Years of education: Not on file  . Highest education level: Not on file  Social Needs  . Financial resource strain: Not on file  . Food insecurity -  worry: Not on file  . Food insecurity - inability: Not on file  . Transportation needs - medical: Not on file  . Transportation needs - non-medical: Not on file  Occupational History  . Not on file  Tobacco Use  . Smoking status: Never Smoker  . Smokeless tobacco: Never Used  Substance and Sexual  Activity  . Alcohol use: Yes    Comment: BEER OCC  . Drug use: No  . Sexual activity: Not on file  Other Topics Concern  . Not on file  Social History Narrative   Married.   1 child.    Works as a Optometrist.   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    FAMILY HISTORY Family History  Problem Relation Age of Onset  . Heart disease Father     ALLERGIES:  is allergic to sulfa antibiotics.  MEDICATIONS:  Current Outpatient Medications  Medication Sig Dispense Refill  . acetaminophen (TYLENOL) 500 MG tablet Take 1,000 mg by mouth every 8 (eight) hours as needed for mild pain or moderate pain.    . Cholecalciferol (VITAMIN D) 2000 units tablet Take 2,000 Units by mouth daily.    Marland Kitchen HYDROcodone-acetaminophen (NORCO) 5-325 MG tablet Take 1-2 tablets by mouth every 4 (four) hours as needed for moderate pain. 12 tablet 0  . ibuprofen (ADVIL,MOTRIN) 800 MG tablet Take 1 tablet (800 mg total) by mouth every 8 (eight) hours as needed. 30 tablet 0  . ketoconazole (NIZORAL) 2 % cream Apply 1 application topically 2 (two) times daily. 15 g 0  . levothyroxine (SYNTHROID, LEVOTHROID) 100 MCG tablet Take 1 tablet by mouth every morning on an empty stomach with a full glass of water. 90 tablet 1  . Omega-3 Fatty Acids (FISH OIL) 1200 MG CAPS Take by mouth.    Marland Kitchen omeprazole (PRILOSEC) 20 MG capsule Take 20 mg by mouth every morning.    . simvastatin (ZOCOR) 20 MG tablet Take 30 mg by mouth at bedtime.    . vitamin B-12 (CYANOCOBALAMIN) 1000 MCG tablet Take 1,000 mcg by mouth daily.     No current facility-administered medications for this visit.     PHYSICAL EXAMINATION:  ECOG PERFORMANCE STATUS: 0 - Asymptomatic   Vitals:   02/11/17 1436 02/11/17 1442  BP:  (!) 165/83  Resp: 16   Temp: 98.5 F (36.9 C)     Filed Weights   02/11/17 1436  Weight: 149 lb 3.2 oz (67.7 kg)     Physical Exam  Constitutional: She is oriented to person, place,  and time and well-developed, well-nourished, and in no distress. No distress.  HENT:  Head: Normocephalic and atraumatic.  Eyes: Conjunctivae and EOM are normal. Pupils are equal, round, and reactive to light. No scleral icterus.  Neck: Normal range of motion. Neck supple.  Cardiovascular: Normal rate and regular rhythm.  No murmur heard. Pulmonary/Chest: Breath sounds normal. No respiratory distress.  Abdominal: Soft. Bowel sounds are normal.  Musculoskeletal: Normal range of motion. She exhibits no edema.  Neurological: She is alert and oriented to person, place, and time. Gait normal.  Skin: Skin is warm and dry.  Psychiatric: Affect normal.   Breast exam was performed in seated position. Patient is status post lumpectomy with a well-healed surgical scar. No evidence of any palpable masses. No evidence of axillary adenopathy. No evidence of any palpable masses or lumps in the right breast. No evidence of right axillary adenopathy   LABORATORY DATA: I have  personally reviewed the data as listed: Surgical pathology 09/18/2016 SPECIMEN SUBMITTED:  A. Breast, left, UOQ  CLINICAL HISTORY:  Indeterminate calcifications  PRE-OPERATIVE DIAGNOSIS:  DCIS  POST-OPERATIVE DIAGNOSIS:  None provided.  DIAGNOSIS:  A. BREAST, LEFT UPPER OUTER QUADRANT; STEREOTACTIC BIOPSY:  - DUCTAL CARCINOMA IN SITU, INTERMEDIATE GRADE.  - CALCIFICATIONS ASSOCIATED WITH DCIS.     Surgical Pathology 10/16/2016  CASE: [id number]  PATIENT: Mallory Leblanc  Surgical Pathology Report  SPECIMEN SUBMITTED:  A. Breast mass, left  B. Sentinel node 1, left  DIAGNOSIS:  A. BREAST MASS, LEFT; NEEDLE LOCALIZED EXCISION:  - RESIDUAL DUCTAL CARCINOMA IN SITU.  - SEE CANCER SUMMARY BELOW.  - BIOPSY SITE CHANGE.   B. SENTINEL LYMPH NODE 1, LEFT; EXCISION:  - ONE LYMPH NODE NEGATIVE FOR MALIGNANCY (0/1).   Surgical Pathology Cancer Case Summary   DUCTAL CARCINOMA IN SITU OF THE BREAST:  Procedure: Needle localized  excision  Specimen Laterality: Left  Size (Extent) of DCIS: at least 7 mm  Histologic Type: Ductal carcinoma in situ (DCIS)  Nuclear Grade: 2  Necrosis: Not identified  Margins: Negative for DCIS    Distance from closest margin: 0.1 mm to superior margin  Regional Lymph nodes:  Total # lymph nodes examined: 1    # Sentinel lymph nodes examined: 1    # Lymph nodes with macrometastasis (>2.0 mm): 0    # Lymph nodes with isolated tumor cells (<0.2 mm): 0    # Lymph nodes with micrometastasis (>0.2 mm and <2.0 mm): 0    Size of largest metastatic deposit: Not applicable    Extranodal extension: Not applicable  Pathologic Stage Classification (pTNM, AJCC 8th Edition): pTis (DCIS)  pN0 (sn)              RADIOGRAPHIC STUDIES: I have personally reviewed the radiological images as listed and agree with the findings in the report Mammogram unilateral 09/08/2016  Mammogram screening bilateral 08/21/2016.  Finding' dictated in HPI.   ASSESSMENT/PLAN Cancer Staging Ductal carcinoma in situ (DCIS) of left breast Staging form: Breast, AJCC 8th Edition - Clinical stage from 09/21/2016: Stage 0 (cTis (DCIS), cN0, cM0, ER: Positive, PR: Positive, HER2: Negative) - Signed by Earlie Server, MD on 10/22/2016 - Pathologic stage from 10/16/2016: Stage 0 (pTis (DCIS), pN0(sn), cM0, ER: Positive, PR: Positive, HER2: Negative) - Signed by Earlie Server, MD on 10/22/2016  .66 yo postmenapausal female with history of DCIS s/p lumpectomy presents to follow up on management of DCIS.   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 Aromatase inhibitor use     I recommend starting aromatase inhibitor letrozole for 5 years. Rational and benefit discussed with patient.  Side effects of letrozole discussed with patient and I provided patient information of letrozole from up to date for her reading. Rx sent to pharmacy.  Check baseline CBC, CMP.  Check baseline Dexa scan.  All questions were  answered. The patient knows to call the clinic with any problems, questions or concerns. Follow up in 4 weeks with labs and evaluation of tolerability of letrozole.   Dr. Earlie Server, MD, PhD [facility] Pager- [phone] 02/11/2017

## 2017-02-19 ENCOUNTER — Other Ambulatory Visit: Payer: Self-pay | Admitting: Internal Medicine

## 2017-02-19 DIAGNOSIS — M549 Dorsalgia, unspecified: Secondary | ICD-10-CM

## 2017-02-19 NOTE — Telephone Encounter (Signed)
Electronic refill request. Ibuprofen 800 mg Last office visit:   02/04/2017 Last Filled:    30 tablet 0 02/04/2017  Please advise.

## 2017-02-20 MED ORDER — IBUPROFEN 800 MG PO TABS: 800.0000 mg | ORAL_TABLET | Freq: Three times a day (TID) | ORAL | 0 refills | Status: DC | PRN

## 2017-02-20 NOTE — Telephone Encounter (Signed)
Okay to refill, sent to pharmacy.

## 2017-03-11 ENCOUNTER — Inpatient Hospital Stay: Payer: BC Managed Care – PPO

## 2017-03-11 ENCOUNTER — Encounter: Payer: Self-pay | Admitting: Oncology

## 2017-03-11 ENCOUNTER — Inpatient Hospital Stay: Payer: BC Managed Care – PPO | Attending: Oncology | Admitting: Oncology

## 2017-03-11 VITALS — BP 153/88 | HR 70 | Temp 98.8°F | Resp 16 | Wt 152.0 lb

## 2017-03-11 DIAGNOSIS — E785 Hyperlipidemia, unspecified: Secondary | ICD-10-CM | POA: Diagnosis not present

## 2017-03-11 DIAGNOSIS — Z9012 Acquired absence of left breast and nipple: Secondary | ICD-10-CM

## 2017-03-11 DIAGNOSIS — D0512 Intraductal carcinoma in situ of left breast: Secondary | ICD-10-CM

## 2017-03-11 DIAGNOSIS — D649 Anemia, unspecified: Secondary | ICD-10-CM | POA: Insufficient documentation

## 2017-03-11 DIAGNOSIS — E039 Hypothyroidism, unspecified: Secondary | ICD-10-CM | POA: Diagnosis not present

## 2017-03-11 DIAGNOSIS — K219 Gastro-esophageal reflux disease without esophagitis: Secondary | ICD-10-CM | POA: Diagnosis not present

## 2017-03-11 DIAGNOSIS — Z170 Estrogen receptor positive status [ER+]: Secondary | ICD-10-CM

## 2017-03-11 DIAGNOSIS — M129 Arthropathy, unspecified: Secondary | ICD-10-CM | POA: Insufficient documentation

## 2017-03-11 DIAGNOSIS — Z79811 Long term (current) use of aromatase inhibitors: Secondary | ICD-10-CM | POA: Diagnosis not present

## 2017-03-11 DIAGNOSIS — R232 Flushing: Secondary | ICD-10-CM | POA: Diagnosis not present

## 2017-03-11 LAB — COMPREHENSIVE METABOLIC PANEL
ALK PHOS: 86 U/L (ref 38–126)
ALT: 19 U/L (ref 14–54)
AST: 24 U/L (ref 15–41)
Albumin: 4.3 g/dL (ref 3.5–5.0)
Anion gap: 9 (ref 5–15)
BUN: 22 mg/dL — ABNORMAL HIGH (ref 6–20)
CALCIUM: 9.5 mg/dL (ref 8.9–10.3)
CHLORIDE: 101 mmol/L (ref 101–111)
CO2: 25 mmol/L (ref 22–32)
Creatinine, Ser: 0.71 mg/dL (ref 0.44–1.00)
Glucose, Bld: 107 mg/dL — ABNORMAL HIGH (ref 65–99)
Potassium: 3.8 mmol/L (ref 3.5–5.1)
Sodium: 135 mmol/L (ref 135–145)
Total Bilirubin: 0.4 mg/dL (ref 0.3–1.2)
Total Protein: 7.2 g/dL (ref 6.5–8.1)

## 2017-03-11 NOTE — Progress Notes (Signed)
[facility] Cancer follow up visit Patient Care Team: Clark, Katherine K, NP as PCP - General (Internal Medicine)  REASON FOR VISIT Follow up for treatment of DCIS  HISTORY OF PRESENTING ILLNESS: Mallory Leblanc 66 y.o. 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 Patient had screening mammogram done on 08/21/2016 with [facility] which revealed grouped calcification that approximately 1 cm in the upper outer quadrant of the left breast 9 cm from nipple which are indicated to terminate a prominent left axillary lymph node is present and is unchanged when compared to the ultrasound of 13 and 2711 studies no other dermatitis is seen in the left breast there are no suspicious masses malignant calcifications site of architecture distortion or concerning asymmetries in the right breast. Patient had diagnostic mammogram unilateral left down on September 09 2006, followed by rest left upper outer quadrant stereotactic biopsy. Pathology showed DCIS intermediate grade, calcifications associated with DCIS. DCIS is present in 4 out of 6 blocks with the largest focus measuring 6 mm. ER more than 90% positive. PR 50-90% positive.  Post lumpectomy pathology showed: DCIS status post lumpectomy. Superior margin is negative but close. 2 mm margin is recommended as it is associated with reduced risk of ipsilateral tumor recurrence. Case was discussed at breast tumor conference and consensus was patient to undergo radiation followed by adjuvant endocrine therapy for 5 years.   She is postmenopausal.  INTERVAL HISTORY Patient presents  For follow-up of DCIS management. She was started on letrozole 2.5 mg daily about one month ago. So far she reports she is tolerating pretty well,  Except hot flashes. Denies any joint pain or mood changes.  Review of Systems  Constitutional: Negative for appetite  change and fatigue.  HENT:   Negative for hearing loss and lump/mass.   Eyes: Negative for eye problems.  Respiratory: Negative for chest tightness, cough and hemoptysis.   Cardiovascular: Negative for chest pain and leg swelling.  Gastrointestinal: Negative for abdominal distention and abdominal pain.  Endocrine: Positive for hot flashes.  Genitourinary: Negative for difficulty urinating and hematuria.   Musculoskeletal: Negative for arthralgias and back pain.  Skin: Negative for itching.  Neurological: Negative for dizziness.  Hematological: Negative for adenopathy. Does not bruise/bleed easily.  Psychiatric/Behavioral: The patient is not nervous/anxious.     MEDICAL HISTORY: Past Medical History:  Diagnosis Date  . Anemia   . Arthritis    LEFT KNEE  . Cancer (HCC)   . Genital warts   . GERD (gastroesophageal reflux disease)    OCC  . Hyperlipidemia   . Hypothyroidism     SURGICAL HISTORY: Past Surgical History:  Procedure Laterality Date  . APPENDECTOMY    . BREAST BIOPSY Left 09/21/2016   Left Affirm Bx- Path pending  . cyst     on the left ovary early 80's  . PARTIAL MASTECTOMY WITH NEEDLE LOCALIZATION Left 10/16/2016   Procedure: PARTIAL MASTECTOMY WITH NEEDLE LOCALIZATION;  Surgeon: Iezzi, Jarvis Wilton, MD;  Location: [facility] ORS;  Service: General;  Laterality: Left;  . SENTINEL NODE BIOPSY Left 10/16/2016   Procedure: SENTINEL NODE BIOPSY;  Surgeon: Buehler, Jarvis Wilton, MD;  Location: [facility] ORS;  Service: General;  Laterality: Left;  . WRIST FRACTURE SURGERY Right     SOCIAL HISTORY: Social History   Socioeconomic History  . Marital status: Married    Spouse name: Not on file  . Number   of children: Not on file  . Years of education: Not on file  . Highest education level: Not on file  Social Needs  . Financial resource strain: Not on file  . Food insecurity - worry: Not on file  . Food insecurity - inability: Not on file  . Transportation needs - medical:  Not on file  . Transportation needs - non-medical: Not on file  Occupational History  . Not on file  Tobacco Use  . Smoking status: Never Smoker  . Smokeless tobacco: Never Used  Substance and Sexual Activity  . Alcohol use: Yes    Comment: BEER OCC  . Drug use: No  . Sexual activity: Not on file  Other Topics Concern  . Not on file  Social History Narrative   Married.   1 child.    Works as a Optometrist.   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    FAMILY HISTORY Family History  Problem Relation Age of Onset  . Heart disease Father     ALLERGIES:  is allergic to sulfa antibiotics.  MEDICATIONS:  Current Outpatient Medications  Medication Sig Dispense Refill  . acetaminophen (TYLENOL) 500 MG tablet Take 1,000 mg by mouth every 8 (eight) hours as needed for mild pain or moderate pain.    . Cholecalciferol (VITAMIN D) 2000 units tablet Take 2,000 Units by mouth daily.    Marland Kitchen HYDROcodone-acetaminophen (NORCO) 5-325 MG tablet Take 1-2 tablets by mouth every 4 (four) hours as needed for moderate pain. 12 tablet 0  . ibuprofen (ADVIL,MOTRIN) 800 MG tablet Take 1 tablet (800 mg total) by mouth every 8 (eight) hours as needed. 30 tablet 0  . ketoconazole (NIZORAL) 2 % cream Apply 1 application topically 2 (two) times daily. 15 g 0  . letrozole (FEMARA) 2.5 MG tablet Take 1 tablet (2.5 mg total) by mouth daily. 30 tablet 3  . levothyroxine (SYNTHROID, LEVOTHROID) 100 MCG tablet Take 1 tablet by mouth every morning on an empty stomach with a full glass of water. 90 tablet 1  . Omega-3 Fatty Acids (FISH OIL) 1200 MG CAPS Take by mouth.    Marland Kitchen omeprazole (PRILOSEC) 20 MG capsule Take 20 mg by mouth every morning.    . simvastatin (ZOCOR) 20 MG tablet Take 30 mg by mouth at bedtime.    . vitamin B-12 (CYANOCOBALAMIN) 1000 MCG tablet Take 1,000 mcg by mouth daily.     No current facility-administered medications for this visit.     PHYSICAL  EXAMINATION:  ECOG PERFORMANCE STATUS: 0 - Asymptomatic   Vitals:   03/11/17 1550  BP: (!) 153/88  Pulse: 70  Resp: 16  Temp: 98.8 F (37.1 C)    Filed Weights   03/11/17 1550  Weight: 152 lb (68.9 kg)     Physical Exam  Constitutional: She is oriented to person, place, and time and well-developed, well-nourished, and in no distress. No distress.  HENT:  Head: Normocephalic and atraumatic.  Mouth/Throat: No oropharyngeal exudate.  Eyes: Conjunctivae and EOM are normal. Pupils are equal, round, and reactive to light. No scleral icterus.  Neck: Normal range of motion. Neck supple. No JVD present.  Cardiovascular: Normal rate and regular rhythm. Exam reveals no friction rub.  No murmur heard. Pulmonary/Chest: Breath sounds normal. No respiratory distress. She has no wheezes.  Abdominal: Soft. Bowel sounds are normal. She exhibits no distension.  Musculoskeletal: Normal range of motion. She exhibits no edema, tenderness or deformity.  Lymphadenopathy:  She has no cervical adenopathy.  Neurological: She is alert and oriented to person, place, and time. Gait normal.  Skin: Skin is warm and dry. No erythema.  Psychiatric: Affect and judgment normal.    LABORATORY DATA: I have personally reviewed the data as listed: Surgical pathology 09/18/2016 SPECIMEN SUBMITTED:  A. Breast, left, UOQ  CLINICAL HISTORY:  Indeterminate calcifications  PRE-OPERATIVE DIAGNOSIS:  DCIS  POST-OPERATIVE DIAGNOSIS:  None provided.  DIAGNOSIS:  A. BREAST, LEFT UPPER OUTER QUADRANT; STEREOTACTIC BIOPSY:  - DUCTAL CARCINOMA IN SITU, INTERMEDIATE GRADE.  - CALCIFICATIONS ASSOCIATED WITH DCIS.     Surgical Pathology 10/16/2016  CASE: [id number]  PATIENT: Juliet Rude  Surgical Pathology Report  SPECIMEN SUBMITTED:  A. Breast mass, left  B. Sentinel node 1, left  DIAGNOSIS:  A. BREAST MASS, LEFT; NEEDLE LOCALIZED EXCISION:  - RESIDUAL DUCTAL CARCINOMA IN SITU.  - SEE CANCER SUMMARY  BELOW.  - BIOPSY SITE CHANGE.   B. SENTINEL LYMPH NODE 1, LEFT; EXCISION:  - ONE LYMPH NODE NEGATIVE FOR MALIGNANCY (0/1).   Surgical Pathology Cancer Case Summary   DUCTAL CARCINOMA IN SITU OF THE BREAST:  Procedure: Needle localized excision  Specimen Laterality: Left  Size (Extent) of DCIS: at least 7 mm  Histologic Type: Ductal carcinoma in situ (DCIS)  Nuclear Grade: 2  Necrosis: Not identified  Margins: Negative for DCIS    Distance from closest margin: 0.1 mm to superior margin  Regional Lymph nodes:  Total # lymph nodes examined: 1    # Sentinel lymph nodes examined: 1    # Lymph nodes with macrometastasis (>2.0 mm): 0    # Lymph nodes with isolated tumor cells (<0.2 mm): 0    # Lymph nodes with micrometastasis (>0.2 mm and <2.0 mm): 0    Size of largest metastatic deposit: Not applicable    Extranodal extension: Not applicable  Pathologic Stage Classification (pTNM, AJCC 8th Edition): pTis (DCIS)  pN0 (sn)              RADIOGRAPHIC STUDIES: I have personally reviewed the radiological images as listed and agree with the findings in the report Mammogram unilateral 09/08/2016  Mammogram screening bilateral 08/21/2016.  Finding' dictated in HPI.   ASSESSMENT/PLAN Cancer Staging Ductal carcinoma in situ (DCIS) of left breast Staging form: Breast, AJCC 8th Edition - Clinical stage from 09/21/2016: Stage 0 (cTis (DCIS), cN0, cM0, ER: Positive, PR: Positive, HER2: Negative) - Signed by Earlie Server, MD on 10/22/2016 - Pathologic stage from 10/16/2016: Stage 0 (pTis (DCIS), pN0(sn), cM0, ER: Positive, PR: Positive, HER2: Negative) - Signed by Earlie Server, MD on 10/22/2016  .67 yo postmenapausal female with history of DCIS s/p lumpectomy presents to follow up on management of DCIS.   1. Ductal carcinoma in situ (DCIS) of left breast      Continue letrozole to finish 5 years.  Patient appears tolerating treatment pretty well. Patient should continue to have  annual mammogramfor surveillance. No history of breast cancer, prostate cancer or colon cancer.   Check baseline Dexa scan.  She has the scan scheduled on 03/29/2017. Patient will call me a week after the scan and we will discuss her results over the phone.If she is osteopenia osteoporosis, I will recommend prophylactic bisphosphonate.If that is the case she will need to obtain dental clearance. All questions were answered. The patient knows to call the clinic with any problems, questions or concerns. Follow up in  3 months or earlier if her bone density scan showed osteopenia or osteoporotic.  Earlie Server, MD, PhD Hematology Oncology [facility] Pager- [phone] 03/11/2017

## 2017-03-29 ENCOUNTER — Ambulatory Visit
Admission: RE | Admit: 2017-03-29 | Discharge: 2017-03-29 | Disposition: A | Discharge status: Home / Self Care | Payer: BC Managed Care – PPO | Source: Ambulatory Visit | Attending: Oncology | Admitting: Oncology

## 2017-03-29 DIAGNOSIS — M818 Other osteoporosis without current pathological fracture: Secondary | ICD-10-CM | POA: Diagnosis not present

## 2017-03-29 DIAGNOSIS — Z79811 Long term (current) use of aromatase inhibitors: Secondary | ICD-10-CM | POA: Insufficient documentation

## 2017-03-29 DIAGNOSIS — D0512 Intraductal carcinoma in situ of left breast: Secondary | ICD-10-CM | POA: Diagnosis present

## 2017-06-07 ENCOUNTER — Other Ambulatory Visit: Payer: Self-pay | Admitting: Oncology

## 2017-06-09 ENCOUNTER — Other Ambulatory Visit: Payer: BC Managed Care – PPO

## 2017-06-09 ENCOUNTER — Ambulatory Visit: Payer: BC Managed Care – PPO | Admitting: Oncology

## 2017-06-10 ENCOUNTER — Ambulatory Visit: Payer: BC Managed Care – PPO | Admitting: Radiation Oncology

## 2017-06-30 ENCOUNTER — Inpatient Hospital Stay: Payer: BC Managed Care – PPO

## 2017-06-30 ENCOUNTER — Encounter: Payer: Self-pay | Admitting: Oncology

## 2017-06-30 ENCOUNTER — Other Ambulatory Visit: Payer: Self-pay

## 2017-06-30 ENCOUNTER — Inpatient Hospital Stay: Payer: BC Managed Care – PPO | Attending: Oncology | Admitting: Oncology

## 2017-06-30 VITALS — BP 144/76 | HR 67 | Temp 98.2°F | Resp 12 | Ht 62.0 in | Wt 149.4 lb

## 2017-06-30 DIAGNOSIS — Z9889 Other specified postprocedural states: Secondary | ICD-10-CM

## 2017-06-30 DIAGNOSIS — D0512 Intraductal carcinoma in situ of left breast: Secondary | ICD-10-CM

## 2017-06-30 DIAGNOSIS — M810 Age-related osteoporosis without current pathological fracture: Secondary | ICD-10-CM | POA: Insufficient documentation

## 2017-06-30 DIAGNOSIS — Z79811 Long term (current) use of aromatase inhibitors: Secondary | ICD-10-CM | POA: Diagnosis not present

## 2017-06-30 DIAGNOSIS — N951 Menopausal and female climacteric states: Secondary | ICD-10-CM | POA: Diagnosis not present

## 2017-06-30 LAB — CBC WITH DIFFERENTIAL/PLATELET
BASOS PCT: 1 %
Basophils Absolute: 0.1 10*3/uL (ref 0–0.1)
EOS ABS: 0.4 10*3/uL (ref 0–0.7)
EOS PCT: 7 %
HCT: 32.5 % — ABNORMAL LOW (ref 35.0–47.0)
Hemoglobin: 11.1 g/dL — ABNORMAL LOW (ref 12.0–16.0)
Lymphocytes Relative: 34 %
Lymphs Abs: 2.1 10*3/uL (ref 1.0–3.6)
MCH: 30.9 pg (ref 26.0–34.0)
MCHC: 34 g/dL (ref 32.0–36.0)
MCV: 90.9 fL (ref 80.0–100.0)
MONO ABS: 0.5 10*3/uL (ref 0.2–0.9)
MONOS PCT: 9 %
Neutro Abs: 3.1 10*3/uL (ref 1.4–6.5)
Neutrophils Relative %: 49 %
Platelets: 234 10*3/uL (ref 150–440)
RBC: 3.58 MIL/uL — ABNORMAL LOW (ref 3.80–5.20)
RDW: 14.3 % (ref 11.5–14.5)
WBC: 6.3 10*3/uL (ref 3.6–11.0)

## 2017-06-30 LAB — COMPREHENSIVE METABOLIC PANEL
ALBUMIN: 4.5 g/dL (ref 3.5–5.0)
ALK PHOS: 78 U/L (ref 38–126)
ALT: 21 U/L (ref 14–54)
AST: 24 U/L (ref 15–41)
Anion gap: 7 (ref 5–15)
BUN: 18 mg/dL (ref 6–20)
CO2: 25 mmol/L (ref 22–32)
CREATININE: 0.49 mg/dL (ref 0.44–1.00)
Calcium: 9.7 mg/dL (ref 8.9–10.3)
Chloride: 104 mmol/L (ref 101–111)
GFR calc Af Amer: >60 mL/min (ref >60)
GFR calc non Af Amer: >60 mL/min (ref >60)
GLUCOSE: 115 mg/dL — AB (ref 65–99)
Potassium: 3.8 mmol/L (ref 3.5–5.1)
SODIUM: 136 mmol/L (ref 135–145)
Total Bilirubin: 0.7 mg/dL (ref 0.3–1.2)
Total Protein: 7.5 g/dL (ref 6.5–8.1)

## 2017-06-30 MED ORDER — LETROZOLE 2.5 MG PO TABS: 2.5000 mg | ORAL_TABLET | Freq: Every day | ORAL | 3 refills | Status: DC

## 2017-06-30 NOTE — Progress Notes (Signed)
Patient here for follow up. No changes since last appt. 

## 2017-06-30 NOTE — Progress Notes (Signed)
[facility] follow up visit Patient Care Team: Pleas Koch, NP as PCP - General (Internal Medicine)  REASON FOR VISIT Follow up for treatment of DCIS  HISTORY OF PRESENTING ILLNESS: Mallory Leblanc 67 y.o. 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 Patient had screening mammogram done on 08/21/2016 with [facility] which revealed grouped calcification that approximately 1 cm in the upper outer quadrant of the left breast 9 cm from nipple which are indicated to terminate a prominent left axillary lymph node is present and is unchanged when compared to the ultrasound of 13 and 2711 studies no other dermatitis is seen in the left breast there are no suspicious masses malignant calcifications site of architecture distortion or concerning asymmetries in the right breast. Patient had diagnostic mammogram unilateral left down on September 09 2006, followed by rest left upper outer quadrant stereotactic biopsy. Pathology showed DCIS intermediate grade, calcifications associated with DCIS. DCIS is present in 4 out of 6 blocks with the largest focus measuring 6 mm. ER more than 90% positive. PR 50-90% positive.  Post lumpectomy pathology showed: DCIS status post lumpectomy. Superior margin is negative but close. 2 mm margin is recommended as it is associated with reduced risk of ipsilateral tumor recurrence. Case was discussed at breast tumor conference and consensus was patient to undergo radiation followed by adjuvant endocrine therapy for 5 years.   She is postmenopausal.  INTERVAL HISTORY Patient presents  For follow-up of DCIS management. She was started on letrozole 2.5 mg daily about one month ago. So far she reports she is tolerating pretty well,  Hot flush is manageable. Denies joint pain or mood swing.   Review of Systems  Constitutional: Negative for appetite  change, chills, diaphoresis and fatigue.  HENT:   Negative for hearing loss, lump/mass and nosebleeds.   Eyes: Negative for eye problems.  Respiratory: Negative for chest tightness, cough, hemoptysis and shortness of breath.   Cardiovascular: Negative for chest pain and leg swelling.  Gastrointestinal: Negative for abdominal distention, abdominal pain, constipation and diarrhea.  Endocrine: Positive for hot flashes.  Genitourinary: Negative for bladder incontinence, difficulty urinating, frequency and hematuria.   Musculoskeletal: Negative for arthralgias, back pain, flank pain and gait problem.  Skin: Negative for itching and rash.  Neurological: Negative for dizziness, gait problem, headaches and numbness.  Hematological: Negative for adenopathy. Does not bruise/bleed easily.  Psychiatric/Behavioral: Negative for confusion, decreased concentration and sleep disturbance. The patient is not nervous/anxious.     MEDICAL HISTORY: Past Medical History:  Diagnosis Date  . Anemia   . Arthritis    LEFT KNEE  . Breast cancer ([place])   . Cancer ([place])   . Genital warts   . GERD (gastroesophageal reflux disease)    OCC  . Hyperlipidemia   . Hypothyroidism     SURGICAL HISTORY: Past Surgical History:  Procedure Laterality Date  . APPENDECTOMY    . BREAST BIOPSY Left 09/21/2016   Left Affirm Bx- Path pending  . cyst     on the left ovary early 80's  . PARTIAL MASTECTOMY WITH NEEDLE LOCALIZATION Left 10/16/2016   Procedure: PARTIAL MASTECTOMY WITH NEEDLE LOCALIZATION;  Surgeon: Leonie Green, MD;  Location: [facility] ORS;  Service: General;  Laterality: Left;  . SENTINEL NODE BIOPSY Left 10/16/2016   Procedure: SENTINEL NODE BIOPSY;  Surgeon: Leonie Green, MD;  Location: [facility] ORS;  Service: General;  Laterality: Left;  . WRIST FRACTURE SURGERY Right     SOCIAL HISTORY: Social History   Socioeconomic History  . Marital status: Married    Spouse name: Not on file  . Number  of children: Not on file  . Years of education: Not on file  . Highest education level: Not on file  Occupational History  . Not on file  Social Needs  . Financial resource strain: Not on file  . Food insecurity:    Worry: Not on file    Inability: Not on file  . Transportation needs:    Medical: Not on file    Non-medical: Not on file  Tobacco Use  . Smoking status: Never Smoker  . Smokeless tobacco: Never Used  Substance and Sexual Activity  . Alcohol use: Yes    Comment: BEER OCC  . Drug use: No  . Sexual activity: Not on file  Lifestyle  . Physical activity:    Days per week: Not on file    Minutes per session: Not on file  . Stress: Not on file  Relationships  . Social connections:    Talks on phone: Not on file    Gets together: Not on file    Attends religious service: Not on file    Active member of club or organization: Not on file    Attends meetings of clubs or organizations: Not on file    Relationship status: Not on file  . Intimate partner violence:    Fear of current or ex partner: Not on file    Emotionally abused: Not on file    Physically abused: Not on file    Forced sexual activity: Not on file  Other Topics Concern  . Not on file  Social History Narrative   Married.   1 child.    Works as a Optometrist.   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    FAMILY HISTORY Family History  Problem Relation Age of Onset  . Heart disease Father     ALLERGIES:  is allergic to sulfa antibiotics.  MEDICATIONS:  Current Outpatient Medications  Medication Sig Dispense Refill  . acetaminophen (TYLENOL) 500 MG tablet Take 1,000 mg by mouth every 8 (eight) hours as needed for mild pain or moderate pain.    . Cholecalciferol (VITAMIN D) 2000 units tablet Take 2,000 Units by mouth daily.    Marland Kitchen ibuprofen (ADVIL,MOTRIN) 800 MG tablet Take 1 tablet (800 mg total) by mouth every 8 (eight) hours as needed. 30 tablet 0  .  letrozole (FEMARA) 2.5 MG tablet Take 1 tablet (2.5 mg total) by mouth daily. 30 tablet 3  . levothyroxine (SYNTHROID, LEVOTHROID) 100 MCG tablet Take 1 tablet by mouth every morning on an empty stomach with a full glass of water. 90 tablet 1  . Omega-3 Fatty Acids (FISH OIL) 1200 MG CAPS Take by mouth.    Marland Kitchen omeprazole (PRILOSEC) 20 MG capsule Take 20 mg by mouth every morning.    . simvastatin (ZOCOR) 20 MG tablet Take 30 mg by mouth at bedtime.    . vitamin B-12 (CYANOCOBALAMIN) 1000 MCG tablet Take 1,000 mcg by mouth daily.    . vitamin E (VITAMIN E) 200 UNIT capsule Take 200 Units by mouth daily.     No current facility-administered medications for this visit.     PHYSICAL EXAMINATION:  ECOG PERFORMANCE STATUS: 0 - Asymptomatic   Vitals:   06/30/17 1142 06/30/17 1146  BP:  Marland Kitchen)  144/76  Pulse:  67  Resp: 12   Temp:  98.2 F (36.8 C)    Filed Weights   06/30/17 1142  Weight: 149 lb 6.4 oz (67.8 kg)     Physical Exam  Constitutional: She is oriented to person, place, and time and well-developed, well-nourished, and in no distress. No distress.  HENT:  Head: Normocephalic and atraumatic.  Right Ear: External ear normal.  Left Ear: External ear normal.  Nose: Nose normal.  Mouth/Throat: No oropharyngeal exudate.  Eyes: Pupils are equal, round, and reactive to light. Conjunctivae and EOM are normal. Left eye exhibits no discharge. No scleral icterus.  Neck: Normal range of motion. Neck supple. No JVD present.  Cardiovascular: Normal rate and regular rhythm. Exam reveals no friction rub.  No murmur heard. Pulmonary/Chest: Breath sounds normal. No respiratory distress. She has no wheezes. She has no rales. She exhibits no tenderness.  Abdominal: Soft. Bowel sounds are normal. She exhibits no distension and no mass. There is no rebound and no guarding.  Musculoskeletal: Normal range of motion. She exhibits no edema, tenderness or deformity.  Lymphadenopathy:    She has no  cervical adenopathy.  Neurological: She is alert and oriented to person, place, and time. No cranial nerve deficit. Gait normal. Coordination normal. GCS score is 15.  Skin: Skin is warm and dry. No rash noted. She is not diaphoretic. No erythema.  Psychiatric: Affect and judgment normal.  Breast exam was performed in seated and lying down position. Patient is status post lumpectomy left breast with a well-healed surgical scar. No evidence of any palpable masses. No evidence of axillary adenopathy. She has focal radiation induced hyperpigmentation and also breast edema.   LABORATORY DATA: I have personally reviewed the data as listed: Surgical pathology 09/18/2016 SPECIMEN SUBMITTED:  A. Breast, left, UOQ  CLINICAL HISTORY:  Indeterminate calcifications  PRE-OPERATIVE DIAGNOSIS:  DCIS  POST-OPERATIVE DIAGNOSIS:  None provided.  DIAGNOSIS:  A. BREAST, LEFT UPPER OUTER QUADRANT; STEREOTACTIC BIOPSY:  - DUCTAL CARCINOMA IN SITU, INTERMEDIATE GRADE.  - CALCIFICATIONS ASSOCIATED WITH DCIS.     Surgical Pathology 10/16/2016  CASE: [id number]  PATIENT: Juliet Rude  Surgical Pathology Report  SPECIMEN SUBMITTED:  A. Breast mass, left  B. Sentinel node 1, left  DIAGNOSIS:  A. BREAST MASS, LEFT; NEEDLE LOCALIZED EXCISION:  - RESIDUAL DUCTAL CARCINOMA IN SITU.  - SEE CANCER SUMMARY BELOW.  - BIOPSY SITE CHANGE.   B. SENTINEL LYMPH NODE 1, LEFT; EXCISION:  - ONE LYMPH NODE NEGATIVE FOR MALIGNANCY (0/1).   Surgical Pathology Cancer Case Summary   DUCTAL CARCINOMA IN SITU OF THE BREAST:  Procedure: Needle localized excision  Specimen Laterality: Left  Size (Extent) of DCIS: at least 7 mm  Histologic Type: Ductal carcinoma in situ (DCIS)  Nuclear Grade: 2  Necrosis: Not identified  Margins: Negative for DCIS    Distance from closest margin: 0.1 mm to superior margin  Regional Lymph nodes:  Total # lymph nodes examined: 1    # Sentinel lymph nodes examined: 1    #  Lymph nodes with macrometastasis (>2.0 mm): 0    # Lymph nodes with isolated tumor cells (<0.2 mm): 0    # Lymph nodes with micrometastasis (>0.2 mm and <2.0 mm): 0    Size of largest metastatic deposit: Not applicable    Extranodal extension: Not applicable  Pathologic Stage Classification (pTNM, AJCC 8th Edition): pTis (DCIS)  pN0 (sn)  RADIOGRAPHIC STUDIES: I have personally reviewed the radiological images as listed and agree with the findings in the report Mammogram unilateral 09/08/2016  Mammogram screening bilateral 08/21/2016.  Finding' dictated in HPI.   03/29/2017  DEXA study: The BMD measured at Femur Neck Right is 0.602 g/cm2 with a T-score of -3.1. This patient is considered osteoporotic according to [facility]) criteria. Site Region Measured Measured WHO Young Adult BMD Date       Age      Classification T-score AP Spine L1-L4 5/29/[age] 64.9 Oste[age] -1.7 0.990 g/cm2 DualFemur Neck Right 5/29/2017 64.9 Osteoporosis -3.1 0.602 g/cm2D  ASSESSMENT/PLAN Cancer Staging Ductal carcinoma in situ (DCIS) of left breast Staging form: Breast, AJCC 8th Edition - Clinical stage from 09/21/2016: Stage 0 (cTis (DCIS), cN0, cM0, ER: Positive, PR: Positive, HER2: Negative) - Signed by Earlie Server, MD on 10/22/2016 - Pathologic stage from 10/16/2016: Stage 0 (pTis (DCIS), pN0(sn), cM0, ER: Positive, PR: Positive, HER2: Negative) - Signed by Earlie Server, MD on 10/22/2016  .67 yo postmenapausal female with history of DCIS s/p lumpectomy presents to follow up on management of DCIS.   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 Aromatase inhibitor use   3. S/P breast lumpectomy   4. Osteoporosis without current pathological fracture, unspecified osteoporosis type    #  Continue letrozole to finish 5 years.  Patient appears tolerating treatment pretty well. Will refill Rx.  # annual mammogramfor surveillance.    # Osteoporosis: DEXA scan  was independently reviewed by me and discussed with patient. Discussed that aromatase inhibitor can worsen her condition. Advise patient to obtain dental clearance and plan Zometa 34m Q6 months.  Patient to call cancer after she obtains dental clearance and schedule of Zometa. .   All questions were answered. The patient knows to call the clinic with any problems, questions or concerns. Follow up in  6 months  . ZEarlie Server MD, PhD Hematology Oncology [facility] HospitalPager- [phone]/24/2019

## 2017-07-13 ENCOUNTER — Telehealth: Payer: Self-pay | Admitting: *Deleted

## 2017-07-13 NOTE — Telephone Encounter (Signed)
Mallory Leblanc, please check if we have received clearance from her dentist. If yes, pleas schedule her to have lab encounter BMP and zometa infusion x 1. Also change her follow up appointment to be 6 months after her zometa.  Lab md and zometa in 6 months. If we have not received clearance yet,  please ask patient to call dentist and have clearance letter faxed to [place]. Thanks.

## 2017-07-13 NOTE — Telephone Encounter (Signed)
Patient called to report she saw her dentist yesterday and he has given her clearance to proceed with treatment here. Please call her after 4 to arrange appts here

## 2017-07-14 NOTE — Telephone Encounter (Signed)
Left message for patient on her voice mail to have her dentist fax over clearance and left fax number [phone]

## 2017-07-14 NOTE — Telephone Encounter (Signed)
Patient states she has contacted her dentsit and given them our fax number

## 2017-07-14 NOTE — Telephone Encounter (Signed)
We have not received Dental Clearance

## 2017-07-19 ENCOUNTER — Ambulatory Visit: Payer: BC Managed Care – PPO | Admitting: Oncology

## 2017-07-19 ENCOUNTER — Other Ambulatory Visit: Payer: BC Managed Care – PPO

## 2017-07-19 NOTE — Telephone Encounter (Signed)
Received dental clearance, copy given to Dr Tasia Catchings.

## 2017-07-20 ENCOUNTER — Ambulatory Visit
Admission: RE | Admit: 2017-07-20 | Discharge: 2017-07-20 | Disposition: A | Discharge status: Home / Self Care | Payer: BC Managed Care – PPO | Source: Ambulatory Visit | Attending: Radiation Oncology | Admitting: Radiation Oncology

## 2017-07-20 ENCOUNTER — Other Ambulatory Visit: Payer: Self-pay

## 2017-07-20 ENCOUNTER — Encounter: Payer: Self-pay | Admitting: Radiation Oncology

## 2017-07-20 VITALS — BP 134/82 | HR 71 | Temp 97.0°F | Resp 20 | Wt 147.4 lb

## 2017-07-20 DIAGNOSIS — Z923 Personal history of irradiation: Secondary | ICD-10-CM | POA: Diagnosis not present

## 2017-07-20 DIAGNOSIS — D0512 Intraductal carcinoma in situ of left breast: Secondary | ICD-10-CM | POA: Insufficient documentation

## 2017-07-20 DIAGNOSIS — Z170 Estrogen receptor positive status [ER+]: Secondary | ICD-10-CM | POA: Insufficient documentation

## 2017-07-20 DIAGNOSIS — M8580 Other specified disorders of bone density and structure, unspecified site: Secondary | ICD-10-CM | POA: Insufficient documentation

## 2017-07-20 DIAGNOSIS — Z79811 Long term (current) use of aromatase inhibitors: Secondary | ICD-10-CM | POA: Diagnosis not present

## 2017-07-20 NOTE — Progress Notes (Signed)
Radiation Oncology Follow up Note  Name: Mallory Leblanc   Date:   07/20/2017 MRN:  [id number] DOB: 1950-03-30    This 67 y.o. female presents to the clinic today for six-month follow-up status post whole breast radiation to her left breast for ER/PR positive ductal carcinoma in situ.  REFERRING PROVIDER: Pleas Koch, NP  HPI: patient is a 67 year old female now seen out 6 months having completed whole breast radiation to her left breastfor ductal carcinoma in situ ER/PR positive seen today in routine follow-up she is doing well. She specifically denies breast tenderness cough or bone pain. She is noted in the left breast is somewhat more leathery than the right I've assured her this associated with radiation changes..she's currently on Femara tolerating that well. Bisphosphonates have been recommended for osteopenia. She's not yet had a mammogram or follow-up withDr. Tollie Pizza  COMPLICATIONS OF TREATMENT: none  FOLLOW UP COMPLIANCE: keeps appointments   PHYSICAL EXAM:  BP 134/82   Pulse 71   Temp (!) 97 F (36.1 C)   Resp 20   Wt 147 lb 6 oz (66.8 kg)   BMI 26.96 kg/m  Lungs are clear to A&P cardiac examination essentially unremarkable with regular rate and rhythm. No dominant mass or nodularity is noted in either breast in 2 positions examined. Incision is well-healed. No axillary or supraclavicular adenopathy is appreciated. Cosmetic result is excellent. Well-developed well-nourished patient in NAD. HEENT reveals PERLA, EOMI, discs not visualized.  Oral cavity is clear. No oral mucosal lesions are identified. Neck is clear without evidence of cervical or supraclavicular adenopathy. Lungs are clear to A&P. Cardiac examination is essentially unremarkable with regular rate and rhythm without murmur rub or thrill. Abdomen is benign with no organomegaly or masses noted. Motor sensory and DTR levels are equal and symmetric in the upper and lower extremities. Cranial nerves II through XII are  grossly intact. Proprioception is intact. No peripheral adenopathy or edema is identified. No motor or sensory levels are noted. Crude visual fields are within normal range.  RADIOLOGY RESULTS: no current films for review  PLAN: resent time she is doing well. She'll be seeing Dr. Tollie Pizza and I and he will be ordering follow-up mammograms. She continues on Femara. I have asked to see her back in 6 months for follow-up. Patient knows to call with any concerns.  I would like to take this opportunity to thank you for allowing me to participate in the care of your patient.Noreene Filbert, MD

## 2017-08-11 ENCOUNTER — Other Ambulatory Visit: Payer: Self-pay | Admitting: Obstetrics and Gynecology

## 2017-08-11 DIAGNOSIS — Z1239 Encounter for other screening for malignant neoplasm of breast: Secondary | ICD-10-CM

## 2017-08-19 ENCOUNTER — Encounter: Payer: Self-pay | Admitting: Primary Care

## 2017-08-23 ENCOUNTER — Encounter: Payer: Self-pay | Admitting: Primary Care

## 2017-08-24 ENCOUNTER — Encounter: Payer: Self-pay | Admitting: Primary Care

## 2017-08-24 ENCOUNTER — Ambulatory Visit: Payer: BC Managed Care – PPO | Admitting: Primary Care

## 2017-08-24 VITALS — BP 140/76 | HR 75 | Temp 98.6°F | Ht 62.0 in | Wt 144.5 lb

## 2017-08-24 DIAGNOSIS — B379 Candidiasis, unspecified: Secondary | ICD-10-CM

## 2017-08-24 DIAGNOSIS — E039 Hypothyroidism, unspecified: Secondary | ICD-10-CM | POA: Diagnosis not present

## 2017-08-24 DIAGNOSIS — J069 Acute upper respiratory infection, unspecified: Secondary | ICD-10-CM | POA: Diagnosis not present

## 2017-08-24 DIAGNOSIS — T3695XA Adverse effect of unspecified systemic antibiotic, initial encounter: Principal | ICD-10-CM

## 2017-08-24 DIAGNOSIS — E785 Hyperlipidemia, unspecified: Secondary | ICD-10-CM

## 2017-08-24 DIAGNOSIS — D0512 Intraductal carcinoma in situ of left breast: Secondary | ICD-10-CM

## 2017-08-24 DIAGNOSIS — R739 Hyperglycemia, unspecified: Secondary | ICD-10-CM

## 2017-08-24 MED ORDER — AZITHROMYCIN 250 MG PO TABS: ORAL_TABLET | ORAL | 0 refills | Status: DC

## 2017-08-24 MED ORDER — FLUCONAZOLE 150 MG PO TABS: 150.0000 mg | ORAL_TABLET | Freq: Once | ORAL | 0 refills | Status: AC

## 2017-08-24 NOTE — Patient Instructions (Signed)
Start Azithromycin antibiotics for infection. Take 2 tablets by mouth today, then 1 tablet daily for 4 additional days.  You can try taking Delsym or Robitussin for cough.  Remember to take your levothyroxine every morning with water only, no food or other medications for 30 minutes. Take your omeprazole at bedtime.  Stop by the lab prior to leaving today. I will notify you of your results once received.   It was a pleasure to see you today!

## 2017-08-24 NOTE — Assessment & Plan Note (Signed)
Repeat mammogram due later in June.  Continue letrozole.

## 2017-08-24 NOTE — Assessment & Plan Note (Signed)
Repeat TSH pending.  Discussed to take levothyroxine with water only, no other medications or food for 30 min. She will take her omeprazole at bedtime.

## 2017-08-24 NOTE — Progress Notes (Signed)
Subjective:    Patient ID: Nena Alexander, female    DOB: 12/29/1950, 66 y.o.   MRN: [id number]  HPI  Ms. Gettinger is a 67 year old female who presents today for follow up. She recently had labs drawn by her oncologist, except for her thyroid function.   1) Hypothyroidism: Currently managed on levothyroxine 100 mcg. TSH from October 2018 at 0.46. She's taking her levothyroxine every morning, sometimes with food. She's also taking her omeprazole with her levothyroxine.   2) Hyperlipidemia: Currently managed on Simvastatin 20 mg. Lipid panel in October 2018 with LDL of 106. Lipid panel from June 2019 with LDL of 92. She denies myalgias.   3) Cough: Present for the past one month. Sinus pressure and sore throat that has been present for the past 1 week. She's coughing up green sputum and expelling green sputum from her nasal cavity. She's been running low grade fevers. She's been using saline nasal spray and Tylenol without much improvement. Overall she's feeling about the same.   Review of Systems  Constitutional: Positive for fatigue and fever.  HENT: Positive for congestion, sinus pressure and sore throat.   Respiratory: Positive for cough. Negative for shortness of breath.   Cardiovascular: Negative for chest pain.       Past Medical History:  Diagnosis Date  . Anemia   . Arthritis    LEFT KNEE  . Breast cancer ([place])   . Cancer ([place])   . Genital warts   . GERD (gastroesophageal reflux disease)    OCC  . Hyperlipidemia   . Hypothyroidism      Social History   Socioeconomic History  . Marital status: Married    Spouse name: Not on file  . Number of children: Not on file  . Years of education: Not on file  . Highest education level: Not on file  Occupational History  . Not on file  Social Needs  . Financial resource strain: Not on file  . Food insecurity:    Worry: Not on file    Inability: Not on file  . Transportation needs:    Medical: Not on file    Non-medical: Not  on file  Tobacco Use  . Smoking status: Never Smoker  . Smokeless tobacco: Never Used  Substance and Sexual Activity  . Alcohol use: Yes    Comment: BEER OCC  . Drug use: No  . Sexual activity: Not on file  Lifestyle  . Physical activity:    Days per week: Not on file    Minutes per session: Not on file  . Stress: Not on file  Relationships  . Social connections:    Talks on phone: Not on file    Gets together: Not on file    Attends religious service: Not on file    Active member of club or organization: Not on file    Attends meetings of clubs or organizations: Not on file    Relationship status: Not on file  . Intimate partner violence:    Fear of current or ex partner: Not on file    Emotionally abused: Not on file    Physically abused: Not on file    Forced sexual activity: Not on file  Other Topics Concern  . Not on file  Social History Narrative   Married.   1 child.    Works as a Optometrist.   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  Past Surgical History:  Procedure Laterality Date  . APPENDECTOMY    . BREAST BIOPSY Left 09/21/2016   Left Affirm Bx- Path pending  . cyst     on the left ovary early 80's  . PARTIAL MASTECTOMY WITH NEEDLE LOCALIZATION Left 10/16/2016   Procedure: PARTIAL MASTECTOMY WITH NEEDLE LOCALIZATION;  Surgeon: Leonie Green, MD;  Location: [facility] OR[facility];  Service: General;  Laterality: Left;  . SENTINEL NODE BIOPSY Left 10/16/2016   Procedure: SENTINEL NODE BIOPSY;  Surgeon: Leonie Green, MD;  Location: [facility] OR[facility];  Service: General;  Laterality: Left;  . WRIST FRACTURE SURGERY Right     Family History  Problem Relation Age of Onset  . Heart disease Father     Allergies  Allergen Reactions  . Sulfa Antibiotics Rash    Current Outpatient Medications on File Prior to Visit  Medication Sig Dispense Refill  . acetaminophen (TYLENOL) 500 MG tablet Take 1,000 mg by mouth every 8  (eight) hours as needed for mild pain or moderate pain.    . Cholecalciferol (D3 VITAMIN PO) Take by mouth.    Marland Kitchen ibuprofen (ADVIL,MOTRIN) 800 MG tablet Take 1 tablet (800 mg total) by mouth every 8 (eight) hours as needed. 30 tablet 0  . letrozole (FEMARA) 2.5 MG tablet Take 1 tablet (2.5 mg total) by mouth daily. 30 tablet 3  . levothyroxine (SYNTHROID, LEVOTHROID) 100 MCG tablet Take 1 tablet by mouth every morning on an empty stomach with a full glass of water. 90 tablet 1  . omeprazole (PRILOSEC) 20 MG capsule Take 20 mg by mouth every morning.    . simvastatin (ZOCOR) 20 MG tablet Take 30 mg by mouth at bedtime.    . vitamin E (VITAMIN E) 200 UNIT capsule Take 200 Units by mouth daily.     No current facility-administered medications on file prior to visit.     BP 140/76   Pulse 75   Temp 98.6 F (37 C) (Oral)   Ht 5\' 2"  (1.575 m)   Wt 144 lb 8 oz (65.5 kg)   SpO2 97%   BMI 26.43 kg/m    Objective:   Physical Exam  Constitutional: She appears well-nourished. She does not appear ill.  HENT:  Right Ear: Tympanic membrane and ear canal normal.  Left Ear: Tympanic membrane and ear canal normal.  Nose: Mucosal edema present. Right sinus exhibits frontal sinus tenderness. Right sinus exhibits no maxillary sinus tenderness. Left sinus exhibits frontal sinus tenderness. Left sinus exhibits no maxillary sinus tenderness.  Mouth/Throat: Oropharynx is clear and moist.  Neck: Neck supple.  Cardiovascular: Normal rate and regular rhythm.  Respiratory: Effort normal. She has wheezes in the left upper field.  Skin: Skin is warm and dry.           Assessment & Plan:  URI:  Cough x 1 month, congestion and sinus pressure x 1 week. Exam today overall stable, mild wheezing noted. Given duration of symptoms coupled with presence of purulent sputum, will treat. Rx for Zpak sent to pharmacy. Discussed use of Delsym or Robitussin. Fluids, rest, follow up PRN.  Pleas Koch, NP

## 2017-08-24 NOTE — Assessment & Plan Note (Signed)
Recent lipid panel stable, continue Simvastatin.  

## 2017-08-25 ENCOUNTER — Other Ambulatory Visit: Payer: Self-pay | Admitting: Primary Care

## 2017-08-25 DIAGNOSIS — R7303 Prediabetes: Secondary | ICD-10-CM | POA: Insufficient documentation

## 2017-08-25 LAB — HEMOGLOBIN A1C: Hgb A1c MFr Bld: 6.2 % (ref 4.6–6.5)

## 2017-08-25 LAB — TSH: TSH: 1.34 u[IU]/mL (ref 0.35–4.50)

## 2017-08-25 NOTE — Assessment & Plan Note (Signed)
Recent A1C of 6.2.  Discussed to work on diet and exercise. Repeat in 6 months.

## 2017-08-26 ENCOUNTER — Ambulatory Visit: Payer: BC Managed Care – PPO | Admitting: Primary Care

## 2017-08-27 ENCOUNTER — Ambulatory Visit
Admission: RE | Admit: 2017-08-27 | Discharge: 2017-08-27 | Disposition: A | Discharge status: Home / Self Care | Payer: BC Managed Care – PPO | Source: Ambulatory Visit | Attending: Obstetrics and Gynecology | Admitting: Obstetrics and Gynecology

## 2017-08-27 DIAGNOSIS — Z1239 Encounter for other screening for malignant neoplasm of breast: Secondary | ICD-10-CM

## 2017-08-27 DIAGNOSIS — Z1231 Encounter for screening mammogram for malignant neoplasm of breast: Secondary | ICD-10-CM | POA: Diagnosis present

## 2017-08-27 HISTORY — DX: Personal history of irradiation: Z92.3

## 2017-09-03 ENCOUNTER — Ambulatory Visit: Payer: BC Managed Care – PPO | Admitting: Internal Medicine

## 2017-09-03 ENCOUNTER — Encounter: Payer: Self-pay | Admitting: Internal Medicine

## 2017-09-03 ENCOUNTER — Encounter: Payer: Self-pay | Admitting: Primary Care

## 2017-09-03 VITALS — BP 120/78 | HR 84 | Temp 98.4°F | Ht 62.0 in | Wt 144.0 lb

## 2017-09-03 DIAGNOSIS — J0140 Acute pansinusitis, unspecified: Secondary | ICD-10-CM

## 2017-09-03 DIAGNOSIS — J329 Chronic sinusitis, unspecified: Secondary | ICD-10-CM | POA: Insufficient documentation

## 2017-09-03 MED ORDER — AMOXICILLIN-POT CLAVULANATE 875-125 MG PO TABS
1.0000 | ORAL_TABLET | Freq: Two times a day (BID) | ORAL | 1 refills | Status: DC
Start: 2017-09-03 — End: 2018-05-12

## 2017-09-03 MED ORDER — FLUCONAZOLE 150 MG PO TABS: 150.0000 mg | ORAL_TABLET | Freq: Once | ORAL | 1 refills | Status: AC

## 2017-09-03 NOTE — Assessment & Plan Note (Signed)
Did improve some but not completely Will broaden coverage with Augmentin Supportive care

## 2017-09-03 NOTE — Progress Notes (Signed)
Subjective:    Patient ID: Mallory Leblanc, female    DOB: 12/16/1950, 67 y.o.   MRN: [id number]  HPI Here due to persistent respiratory symptoms  Did take the z-pak after her last visit Some better but still "don't feel up to par" Last night her throat was very sore---tried gargling with salt water (some better) Ears popping, congested, still coughing Tried robitussin--helps cough Green sputum--some better since z-pak. Seems to be from post nasal drip  No fever or chills Chronic sweats from menopause No SOB  Current Outpatient Medications on File Prior to Visit  Medication Sig Dispense Refill  . acetaminophen (TYLENOL) 500 MG tablet Take 1,000 mg by mouth every 8 (eight) hours as needed for mild pain or moderate pain.    . Cholecalciferol (D3 VITAMIN PO) Take by mouth.    Marland Kitchen ibuprofen (ADVIL,MOTRIN) 800 MG tablet Take 1 tablet (800 mg total) by mouth every 8 (eight) hours as needed. 30 tablet 0  . letrozole (FEMARA) 2.5 MG tablet Take 1 tablet (2.5 mg total) by mouth daily. 30 tablet 3  . levothyroxine (SYNTHROID, LEVOTHROID) 100 MCG tablet Take 1 tablet by mouth every morning on an empty stomach with a full glass of water. 90 tablet 1  . omeprazole (PRILOSEC) 20 MG capsule Take 20 mg by mouth every morning.    . simvastatin (ZOCOR) 20 MG tablet Take 30 mg by mouth at bedtime.    . vitamin E (VITAMIN E) 200 UNIT capsule Take 200 Units by mouth daily.     No current facility-administered medications on file prior to visit.     Allergies  Allergen Reactions  . Sulfa Antibiotics Rash    Past Medical History:  Diagnosis Date  . Anemia   . Arthritis    LEFT KNEE  . Breast cancer ([place]) 2018   Left Breast Cancer- DCIS  . Genital warts   . GERD (gastroesophageal reflux disease)    OCC  . Hyperlipidemia   . Hypothyroidism   . Personal history of radiation therapy 2018   F/U left breast cancer    Past Surgical History:  Procedure Laterality Date  . APPENDECTOMY    . BREAST  BIOPSY Left 09/21/2016   DCIS  . BREAST LUMPECTOMY Left 2018   DCIS  . cyst     on the left ovary early 80's  . PARTIAL MASTECTOMY WITH NEEDLE LOCALIZATION Left 10/16/2016   Procedure: PARTIAL MASTECTOMY WITH NEEDLE LOCALIZATION;  Surgeon: Leonie Green, MD;  Location: [facility] OR[facility];  Service: General;  Laterality: Left;  . SENTINEL NODE BIOPSY Left 10/16/2016   Procedure: SENTINEL NODE BIOPSY;  Surgeon: Leonie Green, MD;  Location: [facility];  Service: General;  Laterality: Left;  . WRIST FRACTURE SURGERY Right     Family History  Problem Relation Age of Onset  . Heart disease Father     Social History   Socioeconomic History  . Marital status: Married    Spouse name: Not on file  . Number of children: Not on file  . Years of education: Not on file  . Highest education level: Not on file  Occupational History  . Not on file  Social Needs  . Financial resource strain: Not on file  . Food insecurity:    Worry: Not on file    Inability: Not on file  . Transportation needs:    Medical: Not on file    Non-medical: Not on file  Tobacco Use  . Smoking status: Never Smoker  .  Smokeless tobacco: Never Used  Substance and Sexual Activity  . Alcohol use: Yes    Comment: BEER OCC  . Drug use: No  . Sexual activity: Not on file  Lifestyle  . Physical activity:    Days per week: Not on file    Minutes per session: Not on file  . Stress: Not on file  Relationships  . Social connections:    Talks on phone: Not on file    Gets together: Not on file    Attends religious service: Not on file    Active member of club or organization: Not on file    Attends meetings of clubs or organizations: Not on file    Relationship status: Not on file  . Intimate partner violence:    Fear of current or ex partner: Not on file    Emotionally abused: Not on file    Physically abused: Not on file    Forced sexual activity: Not on file  Other Topics Concern  . Not on file  Social  History Narrative   Married.   1 child.    Works as a Optometrist.   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   Review of Systems  No headache No rash No N/V/diarrhea Eating okay     Objective:   Physical Exam  Constitutional: She appears well-developed. No distress.  HENT:  Mouth/Throat: Oropharynx is clear and moist. No oropharyngeal exudate.  No sinus tenderness TMs normal Moderate nasal inflammation  Neck: No thyromegaly present.  Respiratory: Effort normal and breath sounds normal. No respiratory distress. She has no wheezes. She has no rales.  Lymphadenopathy:    She has no cervical adenopathy.           Assessment & Plan:

## 2017-10-18 ENCOUNTER — Ambulatory Visit: Payer: BC Managed Care – PPO | Admitting: Internal Medicine

## 2017-10-18 ENCOUNTER — Encounter: Payer: Self-pay | Admitting: Internal Medicine

## 2017-10-18 VITALS — BP 122/78 | HR 76 | Temp 97.9°F | Wt 141.0 lb

## 2017-10-18 DIAGNOSIS — J329 Chronic sinusitis, unspecified: Secondary | ICD-10-CM

## 2017-10-18 DIAGNOSIS — B9789 Other viral agents as the cause of diseases classified elsewhere: Secondary | ICD-10-CM | POA: Diagnosis not present

## 2017-10-18 MED ORDER — PREDNISONE 10 MG PO TABS: ORAL_TABLET | ORAL | 0 refills | Status: DC

## 2017-10-18 NOTE — Progress Notes (Signed)
HPI  Pt presents to the clinic today with c/o persistent nasal congestion and facial pressure. She reports this has been going on for 2 months. She sometimes blows green mucous out of her nose, but now it is clear. She denies current ear pain, runny nose, sore throat or cough. She has been seen twice for the same, once put on Azithromycin and the second time placed on Augmentin. She denies any improvement with abx. She is also taking Mucinex, Vicks Nasal spray or leftover Keflex .She has no history of allergies. She has not had sick contacts.   Review of Systems     Past Medical History:  Diagnosis Date  . Anemia   . Arthritis    LEFT KNEE  . Breast cancer ([place]) 2018   Left Breast Cancer- DCIS  . Genital warts   . GERD (gastroesophageal reflux disease)    OCC  . Hyperlipidemia   . Hypothyroidism   . Personal history of radiation therapy 2018   F/U left breast cancer    Family History  Problem Relation Age of Onset  . Heart disease Father     Social History   Socioeconomic History  . Marital status: Married    Spouse name: Not on file  . Number of children: Not on file  . Years of education: Not on file  . Highest education level: Not on file  Occupational History  . Not on file  Social Needs  . Financial resource strain: Not on file  . Food insecurity:    Worry: Not on file    Inability: Not on file  . Transportation needs:    Medical: Not on file    Non-medical: Not on file  Tobacco Use  . Smoking status: Never Smoker  . Smokeless tobacco: Never Used  Substance and Sexual Activity  . Alcohol use: Yes    Comment: BEER OCC  . Drug use: No  . Sexual activity: Not on file  Lifestyle  . Physical activity:    Days per week: Not on file    Minutes per session: Not on file  . Stress: Not on file  Relationships  . Social connections:    Talks on phone: Not on file    Gets together: Not on file    Attends religious service: Not on file    Active member of club  or organization: Not on file    Attends meetings of clubs or organizations: Not on file    Relationship status: Not on file  . Intimate partner violence:    Fear of current or ex partner: Not on file    Emotionally abused: Not on file    Physically abused: Not on file    Forced sexual activity: Not on file  Other Topics Concern  . Not on file  Social History Narrative   Married.   1 child.    Works as a Optometrist.   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    Allergies  Allergen Reactions  . Sulfa Antibiotics Rash     Constitutional: Denies headache, fatigue, fever or abrupt weight changes.  HEENT:  Positive facial pain, nasal congestion. Denies eye redness, ear pain, ringing in the ears, wax buildup, runny nose or sore throat. Respiratory: Denies cough, difficulty breathing or shortness of breath.  Cardiovascular: Denies chest pain, chest tightness, palpitations or swelling in the hands or feet.   No other specific complaints in a complete review of systems (except  as listed in HPI above).  Objective:   BP 122/78   Pulse 76   Temp 97.9 F (36.6 C) (Oral)   Wt 141 lb (64 kg)   SpO2 97%   BMI 25.79 kg/m   General: Appears her stated age, well developed, well nourished in NAD. HEENT: Head: normal shape and size, no sinus tenderness noted;  Ears: Tm's gray and intact, normal light reflex; Nose: mucosa pink and moist, septum midline; Throat/Mouth: Teeth present, mucosa pink and moist, no exudate noted, no lesions or ulcerations noted.  Neck:  No adenopathy noted.  Cardiovascular: Normal rate and rhythm. S1,S2 noted.  No murmur, rubs or gallops noted.  Pulmonary/Chest: Normal effort and positive vesicular breath sounds. No respiratory distress. No wheezes, rales or ronchi noted.       Assessment & Plan:  Viral Sinusitis:  Can use a Neti Pot which can be purchased from your local drug store. Start Zyrtec OTC eRx for Pred Taper  x 9 days  RTC as needed or if symptoms persist. Webb Silversmith, NP

## 2017-10-18 NOTE — Patient Instructions (Signed)

## 2017-12-30 ENCOUNTER — Other Ambulatory Visit: Payer: BC Managed Care – PPO

## 2017-12-30 ENCOUNTER — Ambulatory Visit: Payer: BC Managed Care – PPO | Admitting: Oncology

## 2018-01-03 ENCOUNTER — Other Ambulatory Visit: Payer: Self-pay

## 2018-01-03 ENCOUNTER — Inpatient Hospital Stay (HOSPITAL_BASED_OUTPATIENT_CLINIC_OR_DEPARTMENT_OTHER): Payer: BC Managed Care – PPO | Admitting: Oncology

## 2018-01-03 ENCOUNTER — Inpatient Hospital Stay: Payer: BC Managed Care – PPO | Attending: Oncology

## 2018-01-03 ENCOUNTER — Encounter: Payer: Self-pay | Admitting: Oncology

## 2018-01-03 VITALS — BP 151/92 | HR 81 | Temp 97.6°F | Resp 18 | Wt 144.4 lb

## 2018-01-03 DIAGNOSIS — Z79811 Long term (current) use of aromatase inhibitors: Secondary | ICD-10-CM | POA: Diagnosis not present

## 2018-01-03 DIAGNOSIS — D649 Anemia, unspecified: Secondary | ICD-10-CM

## 2018-01-03 DIAGNOSIS — M810 Age-related osteoporosis without current pathological fracture: Secondary | ICD-10-CM | POA: Insufficient documentation

## 2018-01-03 DIAGNOSIS — D0512 Intraductal carcinoma in situ of left breast: Secondary | ICD-10-CM

## 2018-01-03 DIAGNOSIS — N951 Menopausal and female climacteric states: Secondary | ICD-10-CM | POA: Diagnosis not present

## 2018-01-03 DIAGNOSIS — E785 Hyperlipidemia, unspecified: Secondary | ICD-10-CM | POA: Insufficient documentation

## 2018-01-03 LAB — CBC WITH DIFFERENTIAL/PLATELET
ABS IMMATURE GRANULOCYTES: 0.05 10*3/uL (ref 0.00–0.07)
BASOS ABS: 0.1 10*3/uL (ref 0.0–0.1)
Basophils Relative: 2 %
Eosinophils Absolute: 0.4 10*3/uL (ref 0.0–0.5)
Eosinophils Relative: 5 %
HEMATOCRIT: 33.6 % — AB (ref 36.0–46.0)
HEMOGLOBIN: 10.7 g/dL — AB (ref 12.0–15.0)
IMMATURE GRANULOCYTES: 1 %
LYMPHS ABS: 2.9 10*3/uL (ref 0.7–4.0)
LYMPHS PCT: 35 %
MCH: 29.6 pg (ref 26.0–34.0)
MCHC: 31.8 g/dL (ref 30.0–36.0)
MCV: 93.1 fL (ref 80.0–100.0)
MONOS PCT: 9 %
Monocytes Absolute: 0.8 10*3/uL (ref 0.1–1.0)
NEUTROS PCT: 48 %
NRBC: 0 % (ref 0.0–0.2)
Neutro Abs: 4 10*3/uL (ref 1.7–7.7)
Platelets: 220 10*3/uL (ref 150–400)
RBC: 3.61 MIL/uL — ABNORMAL LOW (ref 3.87–5.11)
RDW: 13.9 % (ref 11.5–15.5)
WBC: 8.2 10*3/uL (ref 4.0–10.5)

## 2018-01-03 LAB — COMPREHENSIVE METABOLIC PANEL
ALBUMIN: 4.3 g/dL (ref 3.5–5.0)
ALK PHOS: 82 U/L (ref 38–126)
ALT: 21 U/L (ref 0–44)
ANION GAP: 6 (ref 5–15)
AST: 23 U/L (ref 15–41)
BUN: 23 mg/dL (ref 8–23)
CHLORIDE: 107 mmol/L (ref 98–111)
CO2: 26 mmol/L (ref 22–32)
Calcium: 9.5 mg/dL (ref 8.9–10.3)
Creatinine, Ser: 0.71 mg/dL (ref 0.44–1.00)
GFR calc non Af Amer: >60 mL/min (ref >60)
Glucose, Bld: 101 mg/dL — ABNORMAL HIGH (ref 70–99)
Potassium: 3.9 mmol/L (ref 3.5–5.1)
SODIUM: 139 mmol/L (ref 135–145)
Total Bilirubin: 0.4 mg/dL (ref 0.3–1.2)
Total Protein: 7.1 g/dL (ref 6.5–8.1)

## 2018-01-03 MED ORDER — LETROZOLE 2.5 MG PO TABS: 2.5000 mg | ORAL_TABLET | Freq: Every day | ORAL | 5 refills | Status: DC

## 2018-01-03 NOTE — Progress Notes (Signed)
Patient here for follow up. Pt states she has been having a sinus infection since June. She is on Doxycycline and is seeing ENT.

## 2018-01-04 NOTE — Progress Notes (Signed)
[facility] follow up visit Patient Care Team: Pleas Koch, NP as PCP - General (Internal Medicine)  REASON FOR VISIT Follow up for treatment of DCIS  HISTORY OF PRESENTING ILLNESS: Mallory Leblanc 67 y.o. 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 Patient had screening mammogram done on 08/21/2016 with [facility] which revealed grouped calcification that approximately 1 cm in the upper outer quadrant of the left breast 9 cm from nipple which are indicated to terminate a prominent left axillary lymph node is present and is unchanged when compared to the ultrasound of 13 and 2711 studies no other dermatitis is seen in the left breast there are no suspicious masses malignant calcifications site of architecture distortion or concerning asymmetries in the right breast. Patient had diagnostic mammogram unilateral left down on September 09 2006, followed by rest left upper outer quadrant stereotactic biopsy. Pathology showed DCIS intermediate grade, calcifications associated with DCIS. DCIS is present in 4 out of 6 blocks with the largest focus measuring 6 mm. ER more than 90% positive. PR 50-90% positive.  Post lumpectomy pathology showed: DCIS status post lumpectomy. Superior margin is negative but close. 2 mm margin is recommended as it is associated with reduced risk of ipsilateral tumor recurrence. Case was discussed at breast tumor conference and consensus was patient to undergo radiation followed by adjuvant endocrine therapy for 5 years.   She is postmenopausal. Started on Letrozole 2.16m daily since April 2019.   INTERVAL HISTORY Patient with oncology history listed above reviewed by me today presents for follow up for follow up for DCIS.  Patient is taking letrozole 2.561mdaily, since April 2019. Hot flash is manageable.  She was recommended to obtain dental  clearance for starting bisphosphonate. She did not get dental evaluation.  08/27/2017 Diagnostic Mammogram showed benign findings. Will need diagnostic mammogram in 1 year.    Review of Systems  Constitutional: Negative for appetite change, chills, diaphoresis and fatigue.  HENT:   Negative for hearing loss, lump/mass and nosebleeds.   Eyes: Negative for eye problems.  Respiratory: Negative for chest tightness, cough, hemoptysis and shortness of breath.   Cardiovascular: Negative for chest pain and leg swelling.  Gastrointestinal: Negative for abdominal distention, abdominal pain, constipation and diarrhea.  Endocrine: Positive for hot flashes.  Genitourinary: Negative for bladder incontinence, difficulty urinating, frequency and hematuria.   Musculoskeletal: Negative for arthralgias, back pain, flank pain and gait problem.  Skin: Negative for itching and rash.  Neurological: Negative for dizziness, gait problem, headaches and numbness.  Hematological: Negative for adenopathy. Does not bruise/bleed easily.  Psychiatric/Behavioral: Negative for confusion, decreased concentration and sleep disturbance. The patient is not nervous/anxious.     MEDICAL HISTORY: Past Medical History:  Diagnosis Date  . Anemia   . Arthritis    LEFT KNEE  . Breast cancer ([id number]   Left Breast Cancer- DCIS  . Genital warts   . GERD (gastroesophageal reflux disease)    OCC  . Hyperlipidemia   . Hypothyroidism   . Personal history of radiation therapy 2018   F/U left breast cancer    SURGICAL HISTORY: Past Surgical History:  Procedure Laterality Date  . APPENDECTOMY    . BREAST BIOPSY Left 09/21/2016   DCIS  . BREAST LUMPECTOMY Left 2018   DCIS  . cyst     on the left ovary early 80's  . PARTIAL MASTECTOMY WITH  NEEDLE LOCALIZATION Left 10/16/2016   Procedure: PARTIAL MASTECTOMY WITH NEEDLE LOCALIZATION;  Surgeon: Leonie Green, MD;  Location: [facility] ORS;  Service: General;  Laterality:  Left;  . SENTINEL NODE BIOPSY Left 10/16/2016   Procedure: SENTINEL NODE BIOPSY;  Surgeon: Leonie Green, MD;  Location: [facility] ORS;  Service: General;  Laterality: Left;  . WRIST FRACTURE SURGERY Right     SOCIAL HISTORY: Social History   Socioeconomic History  . Marital status: Married    Spouse name: Not on file  . Number of children: Not on file  . Years of education: Not on file  . Highest education level: Not on file  Occupational History  . Not on file  Social Needs  . Financial resource strain: Not on file  . Food insecurity:    Worry: Not on file    Inability: Not on file  . Transportation needs:    Medical: Not on file    Non-medical: Not on file  Tobacco Use  . Smoking status: Never Smoker  . Smokeless tobacco: Never Used  Substance and Sexual Activity  . Alcohol use: Yes    Comment: BEER OCC  . Drug use: No  . Sexual activity: Not on file  Lifestyle  . Physical activity:    Days per week: Not on file    Minutes per session: Not on file  . Stress: Not on file  Relationships  . Social connections:    Talks on phone: Not on file    Gets together: Not on file    Attends religious service: Not on file    Active member of club or organization: Not on file    Attends meetings of clubs or organizations: Not on file    Relationship status: Not on file  . Intimate partner violence:    Fear of current or ex partner: Not on file    Emotionally abused: Not on file    Physically abused: Not on file    Forced sexual activity: Not on file  Other Topics Concern  . Not on file  Social History Narrative   Married.   1 child.    Works as a Optometrist.   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    FAMILY HISTORY Family History  Problem Relation Age of Onset  . Heart disease Father     ALLERGIES:  is allergic to sulfa antibiotics.  MEDICATIONS:  Current Outpatient Medications  Medication Sig Dispense Refill  .  acetaminophen (TYLENOL) 500 MG tablet Take 1,000 mg by mouth every 8 (eight) hours as needed for mild pain or moderate pain.    . Cholecalciferol (D3 VITAMIN PO) Take by mouth.    . doxycycline (ADOXA) 100 MG tablet Take 100 mg by mouth 2 (two) times daily.    . fluticasone (FLONASE) 50 MCG/ACT nasal spray     . letrozole (FEMARA) 2.5 MG tablet Take 1 tablet (2.5 mg total) by mouth daily. 30 tablet 5  . levothyroxine (SYNTHROID, LEVOTHROID) 100 MCG tablet Take 1 tablet by mouth every morning on an empty stomach with a full glass of water. 90 tablet 1  . Omega-3 Fatty Acids (FISH OIL) 1200 MG CAPS Take by mouth.    Marland Kitchen omeprazole (PRILOSEC) 20 MG capsule Take 20 mg by mouth every morning.    . simvastatin (ZOCOR) 20 MG tablet Take 30 mg by mouth at bedtime.    Marland Kitchen amoxicillin-clavulanate (AUGMENTIN) 875-125 MG tablet Take 1 tablet by  mouth 2 (two) times daily. (Patient not taking: Reported on 10/18/2017) 14 tablet 1  . Multiple Minerals-Vitamins (CALCIUM & VIT D3 BONE HEALTH PO) Take 1 capsule by mouth daily.    . predniSONE (DELTASONE) 10 MG tablet Take 3 tabs on days 1-3, take 2 tabs on days 4-6, take 1 tab on days 7-9 (Patient not taking: Reported on 01/03/2018) 18 tablet 0  . vitamin E (VITAMIN E) 200 UNIT capsule Take 200 Units by mouth daily.     No current facility-administered medications for this visit.     PHYSICAL EXAMINATION:  ECOG PERFORMANCE STATUS: 0 - Asymptomatic   Vitals:   01/03/18 1511  BP: (!) 151/92  Pulse: 81  Resp: 18  Temp: 97.6 F (36.4 C)    Filed Weights   01/03/18 1511  Weight: 144 lb 6.4 oz (65.5 kg)     Physical Exam  Constitutional: She is oriented to person, place, and time and well-developed, well-nourished, and in no distress. No distress.  HENT:  Head: Normocephalic and atraumatic.  Right Ear: External ear normal.  Left Ear: External ear normal.  Nose: Nose normal.  Mouth/Throat: Oropharynx is clear and moist. No oropharyngeal exudate.  Eyes:  Pupils are equal, round, and reactive to light. Conjunctivae and EOM are normal. Left eye exhibits no discharge. No scleral icterus.  Neck: Normal range of motion. Neck supple. No JVD present.  Cardiovascular: Normal rate and regular rhythm. Exam reveals no friction rub.  No murmur heard. Pulmonary/Chest: Effort normal and breath sounds normal. No respiratory distress. She has no wheezes. She has no rales. She exhibits no tenderness.  Abdominal: Soft. Bowel sounds are normal. She exhibits no distension and no mass. There is no tenderness. There is no rebound and no guarding.  Musculoskeletal: Normal range of motion. She exhibits no edema, tenderness or deformity.  Lymphadenopathy:    She has no cervical adenopathy.  Neurological: She is alert and oriented to person, place, and time. No cranial nerve deficit. She exhibits normal muscle tone. Gait normal. Coordination normal. GCS score is 15.  Skin: Skin is warm and dry. No rash noted. She is not diaphoretic. No erythema.  Psychiatric: Affect and judgment normal.  Breast exam was performed in seated and lying down position. Patient is status post lumpectomy left breast with a well-healed surgical scar. No evidence of any palpable masses. No evidence of axillary adenopathy. She has focal radiation induced hyperpigmentation and also breast edema.   LABORATORY DATA: I have personally reviewed the data as listed: Surgical pathology 09/18/2016 SPECIMEN SUBMITTED:  A. Breast, left, UOQ  CLINICAL HISTORY:  Indeterminate calcifications  PRE-OPERATIVE DIAGNOSIS:  DCIS  POST-OPERATIVE DIAGNOSIS:  None provided.  DIAGNOSIS:  A. BREAST, LEFT UPPER OUTER QUADRANT; STEREOTACTIC BIOPSY:  - DUCTAL CARCINOMA IN SITU, INTERMEDIATE GRADE.  - CALCIFICATIONS ASSOCIATED WITH DCIS.     Surgical Pathology 10/16/2016  CASE: [id number]  PATIENT: Mallory Leblanc  Surgical Pathology Report  SPECIMEN SUBMITTED:  A. Breast mass, left  B. Sentinel node 1, left   DIAGNOSIS:  A. BREAST MASS, LEFT; NEEDLE LOCALIZED EXCISION:  - RESIDUAL DUCTAL CARCINOMA IN SITU.  - SEE CANCER SUMMARY BELOW.  - BIOPSY SITE CHANGE.   B. SENTINEL LYMPH NODE 1, LEFT; EXCISION:  - ONE LYMPH NODE NEGATIVE FOR MALIGNANCY (0/1).   Surgical Pathology Cancer Case Summary   DUCTAL CARCINOMA IN SITU OF THE BREAST:  Procedure: Needle localized excision  Specimen Laterality: Left  Size (Extent) of DCIS: at least 7 mm  Histologic Type:  Ductal carcinoma in situ (DCIS)  Nuclear Grade: 2  Necrosis: Not identified  Margins: Negative for DCIS    Distance from closest margin: 0.1 mm to superior margin  Regional Lymph nodes:  Total # lymph nodes examined: 1    # Sentinel lymph nodes examined: 1    # Lymph nodes with macrometastasis (>2.0 mm): 0    # Lymph nodes with isolated tumor cells (<0.2 mm): 0    # Lymph nodes with micrometastasis (>0.2 mm and <2.0 mm): 0    Size of largest metastatic deposit: Not applicable    Extranodal extension: Not applicable  Pathologic Stage Classification (pTNM, AJCC 8th Edition): pTis (DCIS)  pN0 (sn)              RADIOGRAPHIC STUDIES: I have personally reviewed the radiological images as listed and agree with the findings in the report Mammogram unilateral 09/08/2016  Mammogram screening bilateral 08/21/2016.  Finding' dictated in HPI.   03/29/2017  DEXA study: The BMD measured at Femur Neck Right is 0.602 g/cm2 with a T-score of -3.1. This patient is considered osteoporotic according to [facility]) criteria. Site Region Measured Measured WHO Young Adult BMD Date       Age      Classification T-score AP Spine L1-L4 5/29/[age] 64.9 Osteopenia -1.7 0.990 g/cm2 DualFemur Neck Right 03/29/2017 64.9 Osteoporosis -3.1 0.602 g/cm2D  ASSESSMENT/PLAN Cancer Staging Ductal carcinoma in situ (DCIS) of left breast Staging form: Breast, AJCC 8th Edition - Clinical stage from 09/21/2016: Stage 0 (cTis  (DCIS), cN0, cM0, ER: Positive, PR: Positive, HER2: Negative) - Signed by Earlie Server, MD on 10/22/2016 - Pathologic stage from 10/16/2016: Stage 0 (pTis (DCIS), pN0(sn), cM0, ER: Positive, PR: Positive, HER2: Negative) - Signed by Earlie Server, MD on 10/22/2016  .67 yo postmenapausal female with history of DCIS s/p lumpectomy presents to follow up on management of DCIS.   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 Aromatase inhibitor use   3. Osteoporosis without current pathological fracture, unspecified osteoporosis type    #  Continue letrozole 2.48m daily to complete 5 yeasts.  She tolerates well with manageable hot flashes.  Annual diagnostic mammogram due in June 2019  # Osteoporosis, recommend vitamin D and calcium supplements. Discussed that aromatase inhibitor can worsen her condition. Discussed about dental evaluation for clearance of starting bisphosphonate for osteoporosis. She has concerns of side effects of treatment and wants to think about it. She is aware of her high risk of pathological fracture.  She will inform me when she is ready for the treatment.   # Hyperlipidemia. Lipid panel reviewed. Advise patient to follow up with primary care physician for management. She is on Simvastatin.    All questions were answered. The patient knows to call the clinic with any problems, questions or concerns. Follow up in  6 months.   ZEarlie Server MD, PhD Hematology Oncology COakwood [place]at ANew [facility]Presbyterian/Lawrence HospitalPager- [phone]/29/2019

## 2018-01-17 ENCOUNTER — Telehealth: Payer: Self-pay | Admitting: Primary Care

## 2018-01-17 DIAGNOSIS — E785 Hyperlipidemia, unspecified: Secondary | ICD-10-CM

## 2018-01-17 DIAGNOSIS — R7303 Prediabetes: Secondary | ICD-10-CM

## 2018-01-17 NOTE — Telephone Encounter (Signed)
Lab orders placed, needs lab only appointment for fasting labs.

## 2018-01-17 NOTE — Telephone Encounter (Signed)
Pt called office to get scheduled for Thyroid labs and cholesterol. Please advise

## 2018-02-02 ENCOUNTER — Ambulatory Visit: Payer: BC Managed Care – PPO | Admitting: Radiation Oncology

## 2018-02-28 ENCOUNTER — Other Ambulatory Visit (INDEPENDENT_AMBULATORY_CARE_PROVIDER_SITE_OTHER): Payer: BC Managed Care – PPO

## 2018-02-28 DIAGNOSIS — E785 Hyperlipidemia, unspecified: Secondary | ICD-10-CM | POA: Diagnosis not present

## 2018-02-28 DIAGNOSIS — R7303 Prediabetes: Secondary | ICD-10-CM

## 2018-02-28 LAB — HEMOGLOBIN A1C: HEMOGLOBIN A1C: 6.1 % (ref 4.6–6.5)

## 2018-02-28 LAB — LIPID PANEL
Cholesterol: 208 mg/dL — ABNORMAL HIGH (ref 0–200)
HDL: 59 mg/dL (ref >39.00)
NonHDL: 149.42
TRIGLYCERIDES: 310 mg/dL — AB (ref 0.0–149.0)
Total CHOL/HDL Ratio: 4
VLDL: 62 mg/dL — ABNORMAL HIGH (ref 0.0–40.0)

## 2018-02-28 LAB — LDL CHOLESTEROL, DIRECT: LDL DIRECT: 109 mg/dL

## 2018-03-01 ENCOUNTER — Other Ambulatory Visit: Payer: Self-pay | Admitting: Primary Care

## 2018-03-01 DIAGNOSIS — E785 Hyperlipidemia, unspecified: Secondary | ICD-10-CM

## 2018-03-01 MED ORDER — ATORVASTATIN CALCIUM 10 MG PO TABS: 10.0000 mg | ORAL_TABLET | Freq: Every day | ORAL | 3 refills | Status: DC

## 2018-03-09 NOTE — Telephone Encounter (Signed)
Hi Raquel Sarna, can you schedule?

## 2018-04-20 ENCOUNTER — Other Ambulatory Visit: Payer: Self-pay | Admitting: Primary Care

## 2018-04-20 DIAGNOSIS — E785 Hyperlipidemia, unspecified: Secondary | ICD-10-CM

## 2018-04-25 ENCOUNTER — Other Ambulatory Visit (INDEPENDENT_AMBULATORY_CARE_PROVIDER_SITE_OTHER): Payer: BC Managed Care – PPO

## 2018-04-25 DIAGNOSIS — E785 Hyperlipidemia, unspecified: Secondary | ICD-10-CM | POA: Diagnosis not present

## 2018-04-25 LAB — HEPATIC FUNCTION PANEL
ALBUMIN: 4.6 g/dL (ref 3.5–5.2)
ALK PHOS: 91 U/L (ref 39–117)
ALT: 20 U/L (ref 0–35)
AST: 20 U/L (ref 0–37)
Bilirubin, Direct: 0.1 mg/dL (ref 0.0–0.3)
TOTAL PROTEIN: 7.4 g/dL (ref 6.0–8.3)
Total Bilirubin: 0.3 mg/dL (ref 0.2–1.2)

## 2018-04-25 LAB — LIPID PANEL
CHOL/HDL RATIO: 3
Cholesterol: 153 mg/dL (ref 0–200)
HDL: 58.2 mg/dL (ref >39.00)
LDL Cholesterol: 69 mg/dL (ref 0–99)
NonHDL: 95.16
Triglycerides: 130 mg/dL (ref 0.0–149.0)
VLDL: 26 mg/dL (ref 0.0–40.0)

## 2018-05-12 ENCOUNTER — Ambulatory Visit: Payer: BC Managed Care – PPO | Admitting: Family Medicine

## 2018-05-12 ENCOUNTER — Encounter: Payer: Self-pay | Admitting: Family Medicine

## 2018-05-12 VITALS — BP 138/60 | HR 86 | Temp 99.0°F | Resp 14 | Ht 62.0 in | Wt 145.5 lb

## 2018-05-12 DIAGNOSIS — R509 Fever, unspecified: Secondary | ICD-10-CM

## 2018-05-12 DIAGNOSIS — J101 Influenza due to other identified influenza virus with other respiratory manifestations: Secondary | ICD-10-CM | POA: Diagnosis not present

## 2018-05-12 LAB — POCT INFLUENZA A/B
INFLUENZA B, POC: NEGATIVE
Influenza A, POC: POSITIVE — AB

## 2018-05-12 MED ORDER — OSELTAMIVIR PHOSPHATE 75 MG PO CAPS: 75.0000 mg | ORAL_CAPSULE | Freq: Two times a day (BID) | ORAL | 0 refills | Status: DC

## 2018-05-12 NOTE — Patient Instructions (Signed)
You have the flu.   This is highly contagious -- you are contagious 1-2 days before you develop symptoms and for up to 7 days after becoming sick.   Oseltamivir (Tamiflu) -- is helpful if started within 48 hours of symptoms. Ideally within 24 hours of symptoms. This may decrease the length of illness by 1 day and decrease the severity of your illness.   Based on your symptoms, it looks like you have a virus.   Antibiotics are not need for a viral infection but the following will help:   1. Drink plenty of fluids 2. Get lots of rest  Sinus Congestion 1) Neti Pot (Saline rinse) -- 2 times day -- if tolerated 2) Flonase (Store Brand ok) - once daily 3) Over the counter congestion medications  Cough 1) Cough drops can be helpful 2) Nyquil (or nighttime cough medication) 3) Honey is proven to be one of the best cough medications   Sore Throat 1) Honey as above, cough drops 2) Ibuprofen or Aleve can be helpful 3) Salt water Gargles  If you develop fevers (Temperature >100.4), chills, worsening symptoms or symptoms lasting longer than 10 days return to clinic.

## 2018-05-12 NOTE — Progress Notes (Signed)
Subjective:     Mallory Leblanc is a 68 y.o. female presenting for URI (symptoms started on 05/10/2018 evening. Scratchy throat, temp was 102 today, nasal congestion, headaches, chills, cough. No body aches.)     URI   This is a new problem. The current episode started in the past 7 days. The problem has been unchanged. The maximum temperature recorded prior to her arrival was 102 - 102.9 F. Associated symptoms include congestion, coughing, headaches, a plugged ear sensation and a sore throat. Pertinent negatives include no chest pain, diarrhea, ear pain, nausea, rhinorrhea, sinus pain, sneezing, vomiting or wheezing. She has tried acetaminophen (gargles, cough medicine) for the symptoms. The treatment provided mild relief.   Flu exposure at school Did get the flu shot  Review of Systems  Constitutional: Positive for chills and fever.  HENT: Positive for congestion and sore throat. Negative for ear pain, rhinorrhea, sinus pain and sneezing.   Respiratory: Positive for cough and shortness of breath. Negative for chest tightness and wheezing.   Cardiovascular: Negative for chest pain.  Gastrointestinal: Negative for diarrhea, nausea and vomiting.  Musculoskeletal: Negative for arthralgias and myalgias.  Neurological: Positive for headaches.     Social History   Tobacco Use  Smoking Status Never Smoker  Smokeless Tobacco Never Used        Objective:    BP Readings from Last 3 Encounters:  05/12/18 138/60  01/03/18 (!) 151/92  10/18/17 122/78   Wt Readings from Last 3 Encounters:  05/12/18 145 lb 8 oz (66 kg)  01/03/18 144 lb 6.4 oz (65.5 kg)  10/18/17 141 lb (64 kg)    BP 138/60   Pulse 86   Temp 99 F (37.2 C)   Resp 14   Ht 5\' 2"  (1.575 m)   Wt 145 lb 8 oz (66 kg)   SpO2 95%   BMI 26.61 kg/m    Physical Exam Constitutional:      General: She is not in acute distress.    Appearance: She is well-developed. She is not diaphoretic.  HENT:     Head:  Normocephalic and atraumatic.     Right Ear: Tympanic membrane and ear canal normal.     Left Ear: Tympanic membrane and ear canal normal.     Nose: Mucosal edema and rhinorrhea present.     Right Sinus: No maxillary sinus tenderness or frontal sinus tenderness.     Left Sinus: No maxillary sinus tenderness or frontal sinus tenderness.     Mouth/Throat:     Pharynx: Uvula midline. Posterior oropharyngeal erythema present. No oropharyngeal exudate.     Tonsils: Swelling: 0 on the right. 0 on the left.  Eyes:     General: No scleral icterus.    Conjunctiva/sclera: Conjunctivae normal.  Neck:     Musculoskeletal: Neck supple.  Cardiovascular:     Rate and Rhythm: Normal rate and regular rhythm.     Heart sounds: Normal heart sounds. No murmur.  Pulmonary:     Effort: Pulmonary effort is normal. No respiratory distress.     Breath sounds: Normal breath sounds. No wheezing.  Lymphadenopathy:     Cervical: No cervical adenopathy.  Skin:    General: Skin is warm and dry.     Capillary Refill: Capillary refill takes less than 2 seconds.  Neurological:     Mental Status: She is alert.      Rapid Flu: positive     Assessment & Plan:   Problem List Items  Addressed This Visit    None    Visit Diagnoses    Fever, unspecified fever cause    -  Primary   Relevant Medications   oseltamivir (TAMIFLU) 75 MG capsule   Other Relevant Orders   POCT Influenza A/B   Influenza A       Relevant Medications   oseltamivir (TAMIFLU) 75 MG capsule     Symptomatic care.   Return if symptoms worsen or fail to improve.  Lesleigh Noe, MD

## 2018-06-27 ENCOUNTER — Encounter: Payer: Self-pay | Admitting: Oncology

## 2018-06-27 ENCOUNTER — Telehealth: Payer: Self-pay | Admitting: *Deleted

## 2018-06-27 NOTE — Telephone Encounter (Signed)
MyChart message:  I would like to cancel my appt for April 27th.Lab appt is at 2:45. And Dr. appt is at 3:00. Thanks [name]

## 2018-07-04 ENCOUNTER — Other Ambulatory Visit: Payer: BC Managed Care – PPO

## 2018-07-04 ENCOUNTER — Ambulatory Visit: Payer: BC Managed Care – PPO | Admitting: Oncology

## 2018-07-05 ENCOUNTER — Encounter: Payer: Self-pay | Admitting: Primary Care

## 2018-07-05 ENCOUNTER — Ambulatory Visit (INDEPENDENT_AMBULATORY_CARE_PROVIDER_SITE_OTHER): Payer: BC Managed Care – PPO | Admitting: Primary Care

## 2018-07-05 DIAGNOSIS — J309 Allergic rhinitis, unspecified: Secondary | ICD-10-CM | POA: Diagnosis not present

## 2018-07-05 MED ORDER — FLUTICASONE PROPIONATE 50 MCG/ACT NA SUSP: 1.0000 | Freq: Two times a day (BID) | NASAL | 0 refills | Status: DC

## 2018-07-05 MED ORDER — LEVOCETIRIZINE DIHYDROCHLORIDE 5 MG PO TABS: 5.0000 mg | ORAL_TABLET | Freq: Every evening | ORAL | 0 refills | Status: DC

## 2018-07-05 NOTE — Progress Notes (Signed)
Subjective:    Patient ID: Mallory Leblanc, female    DOB: 09-27-1950, 68 y.o.   MRN: [id number]  HPI  Virtual Visit via Video Note  I connected with Mallory Leblanc on 07/05/18 at  4:20 PM EDT by a video enabled telemedicine application and verified that I am speaking with the correct person using two identifiers.   I discussed the limitations of evaluation and management by telemedicine and the availability of in person appointments. The patient expressed understanding and agreed to proceed. She is at home, I am in the office.  History of Present Illness:  Mallory Leblanc is a 68 year old female with a history of sinusitis, prediabetes, hypothyroidism who presents today with a chief complaint of nasal congestion.  She also reports pressure under her eyes, rhinorrhea. She's blowing clear mucous from her nasal cavity. She's not used anything OTC for her symptoms. Her symptoms began last Summer and have been waxing and waning since then. She has also seen ENT last year and was told that she has "swollen sinuses" and was told that she needed sinus surgery. She kindly declined surgery at the time. She denies fevers, cough, shortness of breath.    Observations/Objective:  Appears well, not sickly. Speaking in complete sentences. No distress.  Assessment and Plan:  Symptoms representative of chronic allergies. Will start with Xyzal HS and Flonase BID. Discussed to notify if she notices purulent sputum from her nasal cavity, increased pressure to the sinus areas, fevers. Consider ENT evaluation again if needed.  Follow Up Instructions:  Start Xyzal allergy medication every night at bedtime.  Nasal Congestion/Ear Pressure/Sinus Pressure: Try using Flonase (fluticasone) nasal spray. Instill 1 spray in each nostril twice daily.   Please update me in 1-2 weeks if no improvement in your symptoms.  It was a pleasure to see you today! Allie Bossier, NP-C    I discussed the assessment and treatment  plan with the patient. The patient was provided an opportunity to ask questions and all were answered. The patient agreed with the plan and demonstrated an understanding of the instructions.   The patient was advised to call back or seek an in-person evaluation if the symptoms worsen or if the condition fails to improve as anticipated.     Pleas Koch, NP    Review of Systems  Constitutional: Negative for chills, fatigue and fever.  HENT: Positive for congestion, rhinorrhea and sinus pressure. Negative for ear pain, postnasal drip and sinus pain.   Allergic/Immunologic: Positive for environmental allergies.       Past Medical History:  Diagnosis Date  . Anemia   . Arthritis    LEFT KNEE  . 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 Genital warts   . GERD (gastroesophageal reflux disease)    OCC  . Hyperlipidemia   . Hypothyroidism   . Personal history of radiation therapy 2018   F/U left breast cancer     Social History   Socioeconomic History  . Marital status: Married    Spouse name: Not on file  . Number of children: Not on file  . Years of education: Not on file  . Highest education level: Not on file  Occupational History  . Not on file  Social Needs  . Financial resource strain: Not on file  . Food insecurity:    Worry: Not on file    Inability: Not on file  . Transportation needs:    Medical:  Not on file    Non-medical: Not on file  Tobacco Use  . Smoking status: Never Smoker  . Smokeless tobacco: Never Used  Substance and Sexual Activity  . Alcohol use: Yes    Comment: BEER OCC  . Drug use: No  . Sexual activity: Not on file  Lifestyle  . Physical activity:    Days per week: Not on file    Minutes per session: Not on file  . Stress: Not on file  Relationships  . Social connections:    Talks on phone: Not on file    Gets together: Not on file    Attends religious service: Not on file    Active member of club or  organization: Not on file    Attends meetings of clubs or organizations: Not on file    Relationship status: Not on file  . Intimate partner violence:    Fear of current or ex partner: Not on file    Emotionally abused: Not on file    Physically abused: Not on file    Forced sexual activity: Not on file  Other Topics Concern  . Not on file  Social History Narrative   Married.   1 child.    Works as a Optometrist.   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    Past Surgical History:  Procedure Laterality Date  . APPENDECTOMY    . BREAST BIOPSY Left 09/21/2016   DCIS  . BREAST LUMPECTOMY Left 2018   DCIS  . cyst     on the left ovary early 80's  . PARTIAL MASTECTOMY WITH NEEDLE LOCALIZATION Left 10/16/2016   Procedure: PARTIAL MASTECTOMY WITH NEEDLE LOCALIZATION;  Surgeon: Leonie Green, MD;  Location: [facility] ORS;  Service: General;  Laterality: Left;  . SENTINEL NODE BIOPSY Left 10/16/2016   Procedure: SENTINEL NODE BIOPSY;  Surgeon: Leonie Green, MD;  Location: [facility] OR[facility];  Service: General;  Laterality: Left;  . WRIST FRACTURE SURGERY Right     Family History  Problem Relation Age of Onset  . Heart disease Father     Allergies  Allergen Reactions  . Sulfa Antibiotics Rash    Current Outpatient Medications on File Prior to Visit  Medication Sig Dispense Refill  . acetaminophen (TYLENOL) 500 MG tablet Take 1,000 mg by mouth every 8 (eight) hours as needed for mild pain or moderate pain.    Marland Kitchen atorvastatin (LIPITOR) 10 MG tablet Take 1 tablet (10 mg total) by mouth daily. For cholesterol. 90 tablet 3  . Cholecalciferol (D3 VITAMIN PO) Take by mouth.    . letrozole (FEMARA) 2.5 MG tablet Take 1 tablet (2.5 mg total) by mouth daily. 30 tablet 5  . levothyroxine (SYNTHROID, LEVOTHROID) 100 MCG tablet Take 1 tablet by mouth every morning on an empty stomach with a full glass of water. 90 tablet 1  . Multiple  Minerals-Vitamins (CALCIUM & VIT D3 BONE HEALTH PO) Take 1 capsule by mouth daily.    . Omega-3 Fatty Acids (FISH OIL) 1200 MG CAPS Take by mouth.    Marland Kitchen omeprazole (PRILOSEC) 20 MG capsule Take 20 mg by mouth every morning.     No current facility-administered medications on file prior to visit.     There were no vitals taken for this visit.   Objective:   Physical Exam  Constitutional: She is oriented to person, place, and time. She appears well-nourished.  Neck: Neck supple.  Respiratory: Effort normal. No respiratory  distress.  Neurological: She is alert and oriented to person, place, and time.  Skin: Skin is dry.  Psychiatric: She has a normal mood and affect.           Assessment & Plan:

## 2018-07-05 NOTE — Assessment & Plan Note (Signed)
Symptoms representative of chronic allergies. Will start with Xyzal HS and Flonase BID. Discussed to notify if she notices purulent sputum from her nasal cavity, increased pressure to the sinus areas, fevers. Consider ENT evaluation again if needed.

## 2018-07-05 NOTE — Patient Instructions (Signed)
  Start Xyzal allergy medication every night at bedtime.  Nasal Congestion/Ear Pressure/Sinus Pressure: Try using Flonase (fluticasone) nasal spray. Instill 1 spray in each nostril twice daily.   Please update me in 1-2 weeks if no improvement in your symptoms.  It was a pleasure to see you today! Allie Bossier, NP-C

## 2018-07-07 ENCOUNTER — Other Ambulatory Visit: Payer: Self-pay | Admitting: Oncology

## 2018-07-07 NOTE — Telephone Encounter (Signed)
Please schedule patient to follow up with me in person or video visit. Thank you. I refilled the Letrozole.

## 2018-07-07 NOTE — Telephone Encounter (Signed)
Patient last seen in October 2019 and she cancelled her appointment for 07/04/18 and has no follow up appointment scheduled. Do you want to refill this for her, Do you want to schedule her for follow up appointment?

## 2018-07-15 ENCOUNTER — Telehealth: Payer: Self-pay | Admitting: Oncology

## 2018-07-18 ENCOUNTER — Inpatient Hospital Stay: Payer: BC Managed Care – PPO | Attending: Oncology | Admitting: Oncology

## 2018-07-18 ENCOUNTER — Other Ambulatory Visit: Payer: Self-pay

## 2018-07-18 ENCOUNTER — Encounter: Payer: Self-pay | Admitting: Oncology

## 2018-07-18 DIAGNOSIS — Z79811 Long term (current) use of aromatase inhibitors: Secondary | ICD-10-CM

## 2018-07-18 DIAGNOSIS — M810 Age-related osteoporosis without current pathological fracture: Secondary | ICD-10-CM | POA: Diagnosis not present

## 2018-07-18 DIAGNOSIS — D0512 Intraductal carcinoma in situ of left breast: Secondary | ICD-10-CM | POA: Diagnosis not present

## 2018-07-18 DIAGNOSIS — Z170 Estrogen receptor positive status [ER+]: Secondary | ICD-10-CM | POA: Diagnosis not present

## 2018-07-18 NOTE — Progress Notes (Signed)
HEMATOLOGY-ONCOLOGY TeleHEALTH VISIT PROGRESS NOTE  I connected with Mallory Leblanc on 07/18/18 at 10:00 AM EDT by video enabled telemedicine visit and verified that I am speaking with the correct person using two identifiers. I discussed the limitations, risks, security and privacy concerns of performing an evaluation and management service by telemedicine and the availability of in-person appointments. I also discussed with the patient that there may be a patient responsible charge related to this service. The patient expressed understanding and agreed to proceed.   Other persons participating in the visit and their role in the encounter:  Geraldine Solar, [place], check in patient   Janeann Merl, RN, check in patient.   Patient's location: Home  Provider's location: Home office Chief Complaint: 48-month follow-up for DCIS   INTERVAL HISTORY Mallory Leblanc is a 68 y.o. female who has above history reviewed by me today presents for follow up visit for management of DCIS, months follow-up Problems and complaints are listed below:  Patient has been taking letrozole 2.5 mg daily, tolerates well except mild to moderate vasomotor symptoms, hot flash.  She denies any joint aches, fever, chills, nausea, vomiting, new bone pain, chest pain or abdominal pain.  No new concerns today.  Denies any concerns of her breast. She is due for annual diagnostic breast mammogram bilateral in June 2020.  Patient reports that her GYN physician has already ordered and is scheduled it.    Review of Systems  Constitutional: Negative for appetite change, chills, fatigue and fever.  HENT:   Negative for hearing loss and voice change.   Eyes: Negative for eye problems.  Respiratory: Negative for chest tightness and cough.   Cardiovascular: Negative for chest pain.  Gastrointestinal: Negative for abdominal distention, abdominal pain and blood in stool.  Endocrine: Positive for hot flashes.  Genitourinary: Negative for  difficulty urinating and frequency.   Musculoskeletal: Negative for arthralgias.  Skin: Negative for itching and rash.  Neurological: Negative for extremity weakness.  Hematological: Negative for adenopathy.  Psychiatric/Behavioral: Negative for confusion.    Past Medical History:  Diagnosis Date  . Anemia   . Arthritis    LEFT KNEE  . Breast cancer ([place]) 2018   Left Breast Cancer- DCIS  . Genital warts   . GERD (gastroesophageal reflux disease)    OCC  . Hyperlipidemia   . Hypothyroidism   . Personal history of radiation therapy 2018   F/U left breast cancer   Past Surgical History:  Procedure Laterality Date  . APPENDECTOMY    . BREAST BIOPSY Left 09/21/2016   DCIS  . BREAST LUMPECTOMY Left 2018   DCIS  . cyst     on the left ovary early 80's  . PARTIAL MASTECTOMY WITH NEEDLE LOCALIZATION Left 10/16/2016   Procedure: PARTIAL MASTECTOMY WITH NEEDLE LOCALIZATION;  Surgeon: Leonie Green, MD;  Location: [facility] OR[facility];  Service: General;  Laterality: Left;  . SENTINEL NODE BIOPSY Left 10/16/2016   Procedure: SENTINEL NODE BIOPSY;  Surgeon: Leonie Green, MD;  Location: [facility] OR[facility];  Service: General;  Laterality: Left;  . WRIST FRACTURE SURGERY Right     Family History  Problem Relation Age of Onset  . Heart disease Father     Social History   Socioeconomic History  . Marital status: Married    Spouse name: Not on file  . Number of children: Not on file  . Years of education: Not on file  . Highest education level: Not on file  Occupational History  .  Not on file  Social Needs  . Financial resource strain: Not on file  . Food insecurity:    Worry: Not on file    Inability: Not on file  . Transportation needs:    Medical: Not on file    Non-medical: Not on file  Tobacco Use  . Smoking status: Never Smoker  . Smokeless tobacco: Never Used  Substance and Sexual Activity  . Alcohol use: Yes    Comment: BEER OCC  . Drug use: No  . Sexual activity: Not on  file  Lifestyle  . Physical activity:    Days per week: Not on file    Minutes per session: Not on file  . Stress: Not on file  Relationships  . Social connections:    Talks on phone: Not on file    Gets together: Not on file    Attends religious service: Not on file    Active member of club or organization: Not on file    Attends meetings of clubs or organizations: Not on file    Relationship status: Not on file  . Intimate partner violence:    Fear of current or ex partner: Not on file    Emotionally abused: Not on file    Physically abused: Not on file    Forced sexual activity: Not on file  Other Topics Concern  . Not on file  Social History Narrative   Married.   1 child.    Works as a Optometrist.   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    Current Outpatient Medications on File Prior to Visit  Medication Sig Dispense Refill  . acetaminophen (TYLENOL) 500 MG tablet Take 1,000 mg by mouth every 8 (eight) hours as needed for mild pain or moderate pain.    Marland Kitchen atorvastatin (LIPITOR) 10 MG tablet Take 1 tablet (10 mg total) by mouth daily. For cholesterol. 90 tablet 3  . Cholecalciferol (D3 VITAMIN PO) Take by mouth.    . fluticasone (FLONASE) 50 MCG/ACT nasal spray Place 1 spray into both nostrils 2 (two) times daily. 16 g 0  . letrozole (FEMARA) 2.5 MG tablet TAKE 1 TABLET BY MOUTH ONCE A DAY 30 tablet 5  . levocetirizine (XYZAL) 5 MG tablet Take 1 tablet (5 mg total) by mouth every evening. For allergies. 90 tablet 0  . levothyroxine (SYNTHROID, LEVOTHROID) 100 MCG tablet Take 1 tablet by mouth every morning on an empty stomach with a full glass of water. 90 tablet 1  . Multiple Minerals-Vitamins (CALCIUM & VIT D3 BONE HEALTH PO) Take 1 capsule by mouth daily.    Marland Kitchen omeprazole (PRILOSEC) 20 MG capsule Take 20 mg by mouth every morning.     No current facility-administered medications on file prior to visit.     Allergies   Allergen Reactions  . Sulfa Antibiotics Rash       Observations/Objective: Today's Vitals   07/18/18 0948  PainSc: 0-No pain   There is no height or weight on file to calculate BMI.  Physical Exam  Constitutional: She is well-developed, well-nourished, and in no distress. No distress.  HENT:  Head: Normocephalic and atraumatic.  Pulmonary/Chest: Effort normal.  Musculoskeletal:        General: No edema.  Neurological: She is alert.  Psychiatric: Affect normal.    CBC    Component Value Date/Time   WBC 8.2 01/03/2018 1450   RBC 3.61 (L) 01/03/2018 1450   HGB 10.7 (L) 01/03/2018  1450   HCT 33.6 (L) 01/03/2018 1450   PLT 220 01/03/2018 1450   MCV 93.1 01/03/2018 1450   MCH 29.6 01/03/2018 1450   MCHC 31.8 01/03/2018 1450   RDW 13.9 01/03/2018 1450   LYMPHSABS 2.9 01/03/2018 1450   MONOABS 0.8 01/03/2018 1450   EOSABS 0.4 01/03/2018 1450   BASOSABS 0.1 01/03/2018 1450    CMP     Component Value Date/Time   NA 139 01/03/2018 1450   K 3.9 01/03/2018 1450   CL 107 01/03/2018 1450   CO2 26 01/03/2018 1450   GLUCOSE 101 (H) 01/03/2018 1450   BUN 23 01/03/2018 1450   CREATININE 0.71 01/03/2018 1450   CALCIUM 9.5 01/03/2018 1450   PROT 7.4 04/25/2018 0812   ALBUMIN 4.6 04/25/2018 0812   AST 20 04/25/2018 0812   ALT 20 04/25/2018 0812   ALKPHOS 91 04/25/2018 0812   BILITOT 0.3 04/25/2018 0812   GFRNONAA >60 01/03/2018 1450   GFRAA >60 01/03/2018 1450     Assessment and Plan: 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 Osteoporosis without current pathological fracture, unspecified osteoporosis type   3. Aromatase inhibitor use     DCIS, tolerating letrozole.  Continue letrozole 2.5 mg to complete 5 years course. Proceed with annual diagnostic bilateral mammogram.  Ordered via GYN office. I also recommend check CBC and CMP.  Patient informs me that she is going to have other blood work done with her other physicians and will let me know when she has  the plan, she wants to do the blood work together..  Osteoporosis, in the context of chronic aromatase inhibitor use.  Recommend patient to continue take calcium and vitamin D supplements. I discussed again about the recommendation of bisphosphonate treatments and the patient prefers to defer at this point and she will let me know if she changes her mind.  She is made aware about her high pathological fracture risk.   Follow Up Instructions:  4 months with repeat CBC and CMP.  I discussed the assessment and treatment plan with the patient. The patient was provided an opportunity to ask questions and all were answered. The patient agreed with the plan and demonstrated an understanding of the instructions.  The patient was advised to call back or seek an in-person evaluation if the symptoms worsen or if the condition fails to improve as anticipated.   I provided 16 minutes of face-to-face video visit time during this encounter, and > 50% was spent counseling as documented under my assessment & plan.  Earlie Server, MD 07/18/2018 1:04 PM

## 2018-07-18 NOTE — Progress Notes (Signed)
Called patient today for Telehealth visit via [place]. Patient states no new concerns today

## 2018-08-11 ENCOUNTER — Encounter: Payer: Self-pay | Admitting: Oncology

## 2018-08-12 ENCOUNTER — Encounter: Payer: Self-pay | Admitting: Oncology

## 2018-08-23 ENCOUNTER — Other Ambulatory Visit: Payer: Self-pay | Admitting: Obstetrics and Gynecology

## 2018-08-23 DIAGNOSIS — Z853 Personal history of malignant neoplasm of breast: Secondary | ICD-10-CM

## 2018-08-31 ENCOUNTER — Other Ambulatory Visit: Payer: Self-pay

## 2018-08-31 ENCOUNTER — Ambulatory Visit
Admission: RE | Admit: 2018-08-31 | Discharge: 2018-08-31 | Disposition: A | Discharge status: Home / Self Care | Payer: BC Managed Care – PPO | Source: Ambulatory Visit | Attending: Obstetrics and Gynecology | Admitting: Obstetrics and Gynecology

## 2018-08-31 DIAGNOSIS — Z853 Personal history of malignant neoplasm of breast: Secondary | ICD-10-CM

## 2018-09-06 ENCOUNTER — Other Ambulatory Visit: Payer: Self-pay | Admitting: Primary Care

## 2018-09-06 DIAGNOSIS — E039 Hypothyroidism, unspecified: Secondary | ICD-10-CM

## 2018-09-06 MED ORDER — LEVOTHYROXINE SODIUM 100 MCG PO TABS: ORAL_TABLET | ORAL | 1 refills | Status: DC

## 2018-09-19 ENCOUNTER — Other Ambulatory Visit: Payer: Self-pay | Admitting: Primary Care

## 2018-09-19 DIAGNOSIS — Z1159 Encounter for screening for other viral diseases: Secondary | ICD-10-CM

## 2018-09-19 DIAGNOSIS — R7303 Prediabetes: Secondary | ICD-10-CM

## 2018-09-19 DIAGNOSIS — D649 Anemia, unspecified: Secondary | ICD-10-CM

## 2018-09-19 DIAGNOSIS — E039 Hypothyroidism, unspecified: Secondary | ICD-10-CM

## 2018-09-20 ENCOUNTER — Other Ambulatory Visit (INDEPENDENT_AMBULATORY_CARE_PROVIDER_SITE_OTHER): Payer: BC Managed Care – PPO

## 2018-09-20 DIAGNOSIS — E039 Hypothyroidism, unspecified: Secondary | ICD-10-CM

## 2018-09-20 DIAGNOSIS — Z1159 Encounter for screening for other viral diseases: Secondary | ICD-10-CM

## 2018-09-20 DIAGNOSIS — R7303 Prediabetes: Secondary | ICD-10-CM | POA: Diagnosis not present

## 2018-09-20 DIAGNOSIS — D649 Anemia, unspecified: Secondary | ICD-10-CM

## 2018-09-20 LAB — HEMOGLOBIN A1C: Hgb A1c MFr Bld: 6.2 % (ref 4.6–6.5)

## 2018-09-20 LAB — CBC WITH DIFFERENTIAL/PLATELET
Basophils Absolute: 0.2 10*3/uL — ABNORMAL HIGH (ref 0.0–0.1)
Basophils Relative: 2.1 % (ref 0.0–3.0)
Eosinophils Absolute: 0.7 10*3/uL (ref 0.0–0.7)
Eosinophils Relative: 10.5 % — ABNORMAL HIGH (ref 0.0–5.0)
HCT: 34.8 % — ABNORMAL LOW (ref 36.0–46.0)
Hemoglobin: 11.4 g/dL — ABNORMAL LOW (ref 12.0–15.0)
Lymphocytes Relative: 34.5 % (ref 12.0–46.0)
Lymphs Abs: 2.5 10*3/uL (ref 0.7–4.0)
MCHC: 32.9 g/dL (ref 30.0–36.0)
MCV: 92.8 fl (ref 78.0–100.0)
Monocytes Absolute: 0.7 10*3/uL (ref 0.1–1.0)
Monocytes Relative: 9.4 % (ref 3.0–12.0)
Neutro Abs: 3.1 10*3/uL (ref 1.4–7.7)
Neutrophils Relative %: 43.5 % (ref 43.0–77.0)
Platelets: 231 10*3/uL (ref 150.0–400.0)
RBC: 3.75 Mil/uL — ABNORMAL LOW (ref 3.87–5.11)
RDW: 13.8 % (ref 11.5–15.5)
WBC: 7.1 10*3/uL (ref 4.0–10.5)

## 2018-09-20 LAB — COMPREHENSIVE METABOLIC PANEL
ALT: 24 U/L (ref 0–35)
AST: 22 U/L (ref 0–37)
Albumin: 4.7 g/dL (ref 3.5–5.2)
Alkaline Phosphatase: 87 U/L (ref 39–117)
BUN: 22 mg/dL (ref 6–23)
CO2: 26 mEq/L (ref 19–32)
Calcium: 10 mg/dL (ref 8.4–10.5)
Chloride: 102 mEq/L (ref 96–112)
Creatinine, Ser: 0.8 mg/dL (ref 0.40–1.20)
GFR: 71.67 mL/min (ref >60.00)
Glucose, Bld: 105 mg/dL — ABNORMAL HIGH (ref 70–99)
Potassium: 4.2 mEq/L (ref 3.5–5.1)
Sodium: 137 mEq/L (ref 135–145)
Total Bilirubin: 0.5 mg/dL (ref 0.2–1.2)
Total Protein: 7.5 g/dL (ref 6.0–8.3)

## 2018-09-20 LAB — TSH: TSH: 13.03 u[IU]/mL — ABNORMAL HIGH (ref 0.35–4.50)

## 2018-09-21 LAB — HEPATITIS C ANTIBODY
Hepatitis C Ab: NONREACTIVE
SIGNAL TO CUT-OFF: 0.02 (ref <1.00)

## 2018-09-22 ENCOUNTER — Encounter: Payer: Self-pay | Admitting: Primary Care

## 2018-09-22 ENCOUNTER — Ambulatory Visit: Payer: BC Managed Care – PPO | Admitting: Primary Care

## 2018-09-22 ENCOUNTER — Other Ambulatory Visit: Payer: Self-pay

## 2018-09-22 DIAGNOSIS — J329 Chronic sinusitis, unspecified: Secondary | ICD-10-CM

## 2018-09-22 DIAGNOSIS — M810 Age-related osteoporosis without current pathological fracture: Secondary | ICD-10-CM

## 2018-09-22 DIAGNOSIS — R7303 Prediabetes: Secondary | ICD-10-CM

## 2018-09-22 DIAGNOSIS — J309 Allergic rhinitis, unspecified: Secondary | ICD-10-CM

## 2018-09-22 DIAGNOSIS — E785 Hyperlipidemia, unspecified: Secondary | ICD-10-CM

## 2018-09-22 DIAGNOSIS — E039 Hypothyroidism, unspecified: Secondary | ICD-10-CM

## 2018-09-22 NOTE — Assessment & Plan Note (Signed)
Taking levothyroxine inappropriately. Recent TSH of 13.  Discussed to take levothyroxine (thyroid medication) every morning on an empty stomach with water only. No food or other medications for 30 minutes. No heartburn medication, iron pills, calcium, vitamin D, or magnesium pills within four hours of taking levothyroxine.   Repeat TSH in 6 weeks. No adjustments made today.

## 2018-09-22 NOTE — Assessment & Plan Note (Signed)
Slight increase in A1C compared to last year. Recommended to reduce sodas, junk food. Increase water and exercise. Continue to monitor.

## 2018-09-22 NOTE — Progress Notes (Signed)
Subjective:    Patient ID: Mallory Leblanc, female    DOB: 21-Feb-1951, 68 y.o.   MRN: [id number]  HPI  Mallory Leblanc is a 68 year old female who presents today for follow up.  1) Hypothyroidism: Currently prescribed levothyroxine 100 mcg tablets. Recent TSH of 13.03. She is compliant to her levothyroxine for which she takes with all of her other medications including vitamins with water or coffee.   2) Osteoporosis: Currently managed on vitamin D regularly and letrozole. Her last bone density scan was in 2019. She may go onto bisphosphonate treatment for this, following with oncology.   3) Breast Cancer: Following with oncology, managed on letrozole 2.5 mg. Last mammogram was in June 2020.  4) GERD: Managed on omeprazole 20 mg for which she takes daily. Doing well.   5) Prediabetes: Recent A1C of 6.2. last A1C of 6.1 in December 2019. She is compliant to her atorvastatin nightly.   She endorses a poor diet with take out food, french fries, some fried meat. She denies eating desserts. She drinks mostly soda and water. She is not exercising.   Review of Systems  Eyes: Negative for visual disturbance.  Respiratory: Negative for shortness of breath.   Cardiovascular: Negative for chest pain.  Neurological: Negative for dizziness and numbness.       Past Medical History:  Diagnosis Date  . Anemia   . Arthritis    LEFT KNEE  . Breast cancer ([place]) 2018   Left Breast Cancer- DCIS  . Genital warts   . GERD (gastroesophageal reflux disease)    OCC  . Hyperlipidemia   . Hypothyroidism   . Personal history of radiation therapy 2018   F/U left breast cancer     Social History   Socioeconomic History  . Marital status: Married    Spouse name: Not on file  . Number of children: Not on file  . Years of education: Not on file  . Highest education level: Not on file  Occupational History  . Not on file  Social Needs  . Financial resource strain: Not on file  . Food insecurity   Worry: Not on file    Inability: Not on file  . Transportation needs    Medical: Not on file    Non-medical: Not on file  Tobacco Use  . Smoking status: Never Smoker  . Smokeless tobacco: Never Used  Substance and Sexual Activity  . Alcohol use: Yes    Comment: BEER OCC  . Drug use: No  . Sexual activity: Not on file  Lifestyle  . Physical activity    Days per week: Not on file    Minutes per session: Not on file  . Stress: Not on file  Relationships  . Social Herbalist on phone: Not on file    Gets together: Not on file    Attends religious service: Not on file    Active member of club or organization: Not on file    Attends meetings of clubs or organizations: Not on file    Relationship status: Not on file  . Intimate partner violence    Fear of current or ex partner: Not on file    Emotionally abused: Not on file    Physically abused: Not on file    Forced sexual activity: Not on file  Other Topics Concern  . Not on file  Social History Narrative   Married.   1 child.    Works  as a Optometrist.   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    Past Surgical History:  Procedure Laterality Date  . APPENDECTOMY    . BREAST BIOPSY Left 09/21/2016   DCIS  . BREAST LUMPECTOMY Left 2018   DCIS  . cyst     on the left ovary early 80's  . PARTIAL MASTECTOMY WITH NEEDLE LOCALIZATION Left 10/16/2016   Procedure: PARTIAL MASTECTOMY WITH NEEDLE LOCALIZATION;  Surgeon: Leonie Green, MD;  Location: [facility] OR[facility];  Service: General;  Laterality: Left;  . SENTINEL NODE BIOPSY Left 10/16/2016   Procedure: SENTINEL NODE BIOPSY;  Surgeon: Leonie Green, MD;  Location: [facility];  Service: General;  Laterality: Left;  . WRIST FRACTURE SURGERY Right     Family History  Problem Relation Age of Onset  . Heart disease Father     Allergies  Allergen Reactions  . Sulfa Antibiotics Rash    Current Outpatient Medications  on File Prior to Visit  Medication Sig Dispense Refill  . acetaminophen (TYLENOL) 500 MG tablet Take 1,000 mg by mouth every 8 (eight) hours as needed for mild pain or moderate pain.    Marland Kitchen atorvastatin (LIPITOR) 10 MG tablet Take 1 tablet (10 mg total) by mouth daily. For cholesterol. 90 tablet 3  . Cholecalciferol (D3 VITAMIN PO) Take by mouth.    . fluticasone (FLONASE) 50 MCG/ACT nasal spray Place 1 spray into both nostrils 2 (two) times daily. 16 g 0  . letrozole (FEMARA) 2.5 MG tablet TAKE 1 TABLET BY MOUTH ONCE A DAY 30 tablet 5  . levocetirizine (XYZAL) 5 MG tablet Take 1 tablet (5 mg total) by mouth every evening. For allergies. 90 tablet 0  . levothyroxine (SYNTHROID) 100 MCG tablet Take 1 tablet by mouth every morning on an empty stomach with a full glass of water. 90 tablet 1  . Multiple Minerals-Vitamins (CALCIUM & VIT D3 BONE HEALTH PO) Take 1 capsule by mouth daily.    Marland Kitchen omeprazole (PRILOSEC) 20 MG capsule Take 20 mg by mouth every morning.     No current facility-administered medications on file prior to visit.     BP 122/78   Pulse 83   Temp 98.5 F (36.9 C) (Temporal)   Ht 5\' 2"  (1.575 m)   Wt 148 lb 4 oz (67.2 kg)   SpO2 97%   BMI 27.12 kg/m    Objective:   Physical Exam  Constitutional: She appears well-nourished.  Neck: Neck supple.  Cardiovascular: Normal rate and regular rhythm.  Respiratory: Effort normal and breath sounds normal.  Skin: Skin is warm and dry.  Psychiatric: She has a normal mood and affect.           Assessment & Plan:

## 2018-09-22 NOTE — Patient Instructions (Addendum)
Be sure to take your levothyroxine (thyroid medication) every morning on an empty stomach with water only. No food or other medications for 30 minutes. No heartburn medication, iron pills, calcium, vitamin D, or magnesium pills within four hours of taking levothyroxine.   You will be contacted regarding your referral to ENT.  Please let us know if you have not been contacted within one week.   Start exercising. You should be getting 150 minutes of moderate intensity exercise weekly.  It's important to improve your diet by reducing consumption of fast food, fried food, processed snack foods, sugary drinks. Increase consumption of fresh vegetables and fruits, whole grains, water.  Ensure you are drinking 64 ounces of water daily.  Schedule a lab only appointment for 6 weeks to repeat thyroid function.  It was a pleasure to see you today!

## 2018-09-22 NOTE — Assessment & Plan Note (Signed)
Managed on Femara and vitamin D. Recommended RX treatment for which she will discuss with her oncologist in the Fall this year.  Recommended weight bearing exercise.

## 2018-09-22 NOTE — Assessment & Plan Note (Signed)
Compliant to Xyzal, some residual symptoms. Evaluated by ENT who recommended sinus surgery for which she has not decided upon. Continued sinus pressure.

## 2018-09-22 NOTE — Assessment & Plan Note (Signed)
Numerous sinusitis infections. Evaluated by ENT recently who recommended nasal sinus surgery, she would like a second opinion. Referral placed.

## 2018-09-22 NOTE — Assessment & Plan Note (Signed)
Recent LDL at goal. Compliant to atorvastatin, continue same.

## 2018-10-12 ENCOUNTER — Other Ambulatory Visit: Payer: Self-pay | Admitting: Primary Care

## 2018-10-12 DIAGNOSIS — J309 Allergic rhinitis, unspecified: Secondary | ICD-10-CM

## 2018-11-03 ENCOUNTER — Other Ambulatory Visit (INDEPENDENT_AMBULATORY_CARE_PROVIDER_SITE_OTHER): Payer: Medicare Other

## 2018-11-03 ENCOUNTER — Other Ambulatory Visit: Payer: Self-pay

## 2018-11-03 DIAGNOSIS — E039 Hypothyroidism, unspecified: Secondary | ICD-10-CM | POA: Diagnosis not present

## 2018-11-03 LAB — TSH: TSH: 0.55 u[IU]/mL (ref 0.35–4.50)

## 2018-11-21 ENCOUNTER — Other Ambulatory Visit: Payer: BC Managed Care – PPO

## 2018-11-21 ENCOUNTER — Ambulatory Visit: Payer: BC Managed Care – PPO | Admitting: Oncology

## 2018-11-24 ENCOUNTER — Other Ambulatory Visit: Payer: Self-pay

## 2018-11-24 ENCOUNTER — Encounter: Payer: Self-pay | Admitting: Oncology

## 2018-11-24 NOTE — Progress Notes (Signed)
Patient pre screened for office appointment, no questions or concerns today. 

## 2018-11-25 ENCOUNTER — Other Ambulatory Visit: Payer: Self-pay

## 2018-11-25 ENCOUNTER — Inpatient Hospital Stay: Payer: Medicare Other | Attending: Oncology

## 2018-11-25 ENCOUNTER — Inpatient Hospital Stay (HOSPITAL_BASED_OUTPATIENT_CLINIC_OR_DEPARTMENT_OTHER): Payer: Medicare Other | Admitting: Oncology

## 2018-11-25 VITALS — BP 161/89 | HR 72 | Temp 98.2°F | Resp 16 | Wt 147.0 lb

## 2018-11-25 DIAGNOSIS — D649 Anemia, unspecified: Secondary | ICD-10-CM | POA: Diagnosis not present

## 2018-11-25 DIAGNOSIS — D0512 Intraductal carcinoma in situ of left breast: Secondary | ICD-10-CM

## 2018-11-25 DIAGNOSIS — M810 Age-related osteoporosis without current pathological fracture: Secondary | ICD-10-CM | POA: Insufficient documentation

## 2018-11-25 DIAGNOSIS — N951 Menopausal and female climacteric states: Secondary | ICD-10-CM | POA: Diagnosis not present

## 2018-11-25 LAB — COMPREHENSIVE METABOLIC PANEL
ALT: 30 U/L (ref 0–44)
AST: 26 U/L (ref 15–41)
Albumin: 4.5 g/dL (ref 3.5–5.0)
Alkaline Phosphatase: 82 U/L (ref 38–126)
Anion gap: 8 (ref 5–15)
BUN: 11 mg/dL (ref 8–23)
CO2: 27 mmol/L (ref 22–32)
Calcium: 9.7 mg/dL (ref 8.9–10.3)
Chloride: 101 mmol/L (ref 98–111)
Creatinine, Ser: 0.62 mg/dL (ref 0.44–1.00)
GFR calc Af Amer: >60 mL/min (ref >60)
GFR calc non Af Amer: >60 mL/min (ref >60)
Glucose, Bld: 108 mg/dL — ABNORMAL HIGH (ref 70–99)
Potassium: 4 mmol/L (ref 3.5–5.1)
Sodium: 136 mmol/L (ref 135–145)
Total Bilirubin: 0.5 mg/dL (ref 0.3–1.2)
Total Protein: 7.6 g/dL (ref 6.5–8.1)

## 2018-11-25 LAB — CBC WITH DIFFERENTIAL/PLATELET
Abs Immature Granulocytes: 0.03 10*3/uL (ref 0.00–0.07)
Basophils Absolute: 0.1 10*3/uL (ref 0.0–0.1)
Basophils Relative: 2 %
Eosinophils Absolute: 0.6 10*3/uL — ABNORMAL HIGH (ref 0.0–0.5)
Eosinophils Relative: 7 %
HCT: 34.2 % — ABNORMAL LOW (ref 36.0–46.0)
Hemoglobin: 11.1 g/dL — ABNORMAL LOW (ref 12.0–15.0)
Immature Granulocytes: 0 %
Lymphocytes Relative: 31 %
Lymphs Abs: 2.5 10*3/uL (ref 0.7–4.0)
MCH: 30.4 pg (ref 26.0–34.0)
MCHC: 32.5 g/dL (ref 30.0–36.0)
MCV: 93.7 fL (ref 80.0–100.0)
Monocytes Absolute: 0.8 10*3/uL (ref 0.1–1.0)
Monocytes Relative: 9 %
Neutro Abs: 4.1 10*3/uL (ref 1.7–7.7)
Neutrophils Relative %: 51 %
Platelets: 229 10*3/uL (ref 150–400)
RBC: 3.65 MIL/uL — ABNORMAL LOW (ref 3.87–5.11)
RDW: 13.3 % (ref 11.5–15.5)
WBC: 8.1 10*3/uL (ref 4.0–10.5)
nRBC: 0 % (ref 0.0–0.2)

## 2018-11-25 LAB — IRON AND TIBC
Iron: 72 ug/dL (ref 28–170)
Saturation Ratios: 17 % (ref 10.4–31.8)
TIBC: 416 ug/dL (ref 250–450)
UIBC: 344 ug/dL

## 2018-11-25 LAB — FERRITIN: Ferritin: 63 ng/mL (ref 11–307)

## 2018-11-25 MED ORDER — LETROZOLE 2.5 MG PO TABS: 2.5000 mg | ORAL_TABLET | Freq: Every day | ORAL | 5 refills | Status: DC

## 2018-11-25 NOTE — Progress Notes (Signed)
[facility] follow up visit Patient Care Team: Pleas Koch, NP as PCP - General (Internal Medicine)  REASON FOR VISIT Follow up for treatment of DCIS  HISTORY OF PRESENTING ILLNESS: Mallory Leblanc 68 y.o. 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 Patient had screening mammogram done on 08/21/2016 with [facility] which revealed grouped calcification that approximately 1 cm in the upper outer quadrant of the left breast 9 cm from nipple which are indicated to terminate a prominent left axillary lymph node is present and is unchanged when compared to the ultrasound of 13 and 2711 studies no other dermatitis is seen in the left breast there are no suspicious masses malignant calcifications site of architecture distortion or concerning asymmetries in the right breast. Patient had diagnostic mammogram unilateral left down on September 09 2006, followed by rest left upper outer quadrant stereotactic biopsy. Pathology showed DCIS intermediate grade, calcifications associated with DCIS. DCIS is present in 4 out of 6 blocks with the largest focus measuring 6 mm. ER more than 90% positive. PR 50-90% positive.  Post lumpectomy pathology showed: DCIS status post lumpectomy. Superior margin is negative but close. 2 mm margin is recommended as it is associated with reduced risk of ipsilateral tumor recurrence. Case was discussed at breast tumor conference and consensus was patient to undergo radiation followed by adjuvant endocrine therapy for 5 years.   She is postmenopausal. Started on Letrozole 2.3m daily since April 2019.   # 08/27/2017 Diagnostic Mammogram showed benign findings. Will need diagnostic mammogram in 1 year.  INTERVAL HISTORY Patient with oncology history listed above reviewed by me today presents for follow up for follow up for DCIS.  Patient takes letrozole  2.5 mg daily.  Since April 2019.  She has chronic intermittent hot flashes which is manageable. Today she has no new complaints.  No concerns of her breast. She has had annual diagnostic mammogram on 08/31/2018.    Review of Systems  Constitutional: Negative for appetite change, chills, diaphoresis, fatigue and fever.  HENT:   Negative for hearing loss, lump/mass, nosebleeds and voice change.   Eyes: Negative for eye problems.  Respiratory: Negative for chest tightness, cough, hemoptysis and shortness of breath.   Cardiovascular: Negative for chest pain and leg swelling.  Gastrointestinal: Negative for abdominal distention, abdominal pain, blood in stool, constipation and diarrhea.  Endocrine: Positive for hot flashes.  Genitourinary: Negative for bladder incontinence, difficulty urinating, frequency and hematuria.   Musculoskeletal: Negative for arthralgias, back pain, flank pain and gait problem.  Skin: Negative for itching and rash.  Neurological: Negative for dizziness, extremity weakness, gait problem, headaches and numbness.  Hematological: Negative for adenopathy. Does not bruise/bleed easily.  Psychiatric/Behavioral: Negative for confusion, decreased concentration and sleep disturbance. The patient is not nervous/anxious.     MEDICAL HISTORY: Past Medical History:  Diagnosis Date  . Anemia   . Arthritis    LEFT KNEE  . [place] [place]   Left Breast Cancer- DCIS  . Genital warts   . GERD (gastroesophageal reflux disease)    OCC  . Hyperlipidemia   . Hypothyroidism   . Personal history of radiation therapy 2018   F/U left breast cancer    SURGICAL HISTORY: Past Surgical History:  Procedure Laterality Date  . APPENDECTOMY    . BREAST BIOPSY Left 09/21/2016   DCIS  . BREAST LUMPECTOMY Left 2018  DCIS  . cyst     on the left ovary early 80's  . PARTIAL MASTECTOMY WITH NEEDLE LOCALIZATION Left 10/16/2016   Procedure: PARTIAL MASTECTOMY WITH NEEDLE  LOCALIZATION;  Surgeon: Leonie Green, MD;  Location: [facility] ORS;  Service: General;  Laterality: Left;  . SENTINEL NODE BIOPSY Left 10/16/2016   Procedure: SENTINEL NODE BIOPSY;  Surgeon: Leonie Green, MD;  Location: [facility] ORS;  Service: General;  Laterality: Left;  . WRIST FRACTURE SURGERY Right     SOCIAL HISTORY: Social History   Socioeconomic History  . Marital status: Married    Spouse name: Not on file  . Number of children: Not on file  . Years of education: Not on file  . Highest education level: Not on file  Occupational History  . Not on file  Social Needs  . Financial resource strain: Not on file  . Food insecurity    Worry: Not on file    Inability: Not on file  . Transportation needs    Medical: Not on file    Non-medical: Not on file  Tobacco Use  . Smoking status: Never Smoker  . Smokeless tobacco: Never Used  Substance and Sexual Activity  . Alcohol use: Yes    Comment: BEER OCC  . Drug use: No  . Sexual activity: Not on file  Lifestyle  . Physical activity    Days per week: Not on file    Minutes per session: Not on file  . Stress: Not on file  Relationships  . Social Herbalist on phone: Not on file    Gets together: Not on file    Attends religious service: Not on file    Active member of club or organization: Not on file    Attends meetings of clubs or organizations: Not on file    Relationship status: Not on file  . Intimate partner violence    Fear of current or ex partner: Not on file    Emotionally abused: Not on file    Physically abused: Not on file    Forced sexual activity: Not on file  Other Topics Concern  . Not on file  Social History Narrative   Married.   1 child.    Works as a Optometrist.   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    FAMILY HISTORY Family History  Problem Relation Age of Onset  . Heart disease Father     ALLERGIES:  is allergic to sulfa  antibiotics.  MEDICATIONS:  Current Outpatient Medications  Medication Sig Dispense Refill  . acetaminophen (TYLENOL) 500 MG tablet Take 1,000 mg by mouth every 8 (eight) hours as needed for mild pain or moderate pain.    Marland Kitchen atorvastatin (LIPITOR) 10 MG tablet Take 1 tablet (10 mg total) by mouth daily. For cholesterol. 90 tablet 3  . Cholecalciferol (D3 VITAMIN PO) Take by mouth.    . fluticasone (FLONASE) 50 MCG/ACT nasal spray Place 1 spray into both nostrils 2 (two) times daily. 16 g 0  . letrozole (FEMARA) 2.5 MG tablet Take 1 tablet (2.5 mg total) by mouth daily. 30 tablet 5  . levocetirizine (XYZAL) 5 MG tablet TAKE 1 TABLET BY MOUTH ONCE EVERY EVENING FOR ALLERGIES 90 tablet 2  . levothyroxine (SYNTHROID) 100 MCG tablet Take 1 tablet by mouth every morning on an empty stomach with a full glass of water. 90 tablet 1  . Multiple Minerals-Vitamins (CALCIUM & VIT  D3 BONE HEALTH PO) Take 1 capsule by mouth daily.    Marland Kitchen omeprazole (PRILOSEC) 20 MG capsule Take 20 mg by mouth every morning.     No current facility-administered medications for this visit.     PHYSICAL EXAMINATION:  ECOG PERFORMANCE STATUS: 0 - Asymptomatic   Vitals:   11/25/18 1012  BP: (!) 161/89  Pulse: 72  Resp: 16  Temp: 98.2 F (36.8 C)    Filed Weights   11/25/18 1012  Weight: 147 lb (66.7 kg)     Physical Exam  Constitutional: She is oriented to person, place, and time and well-developed, well-nourished, and in no distress. No distress.  HENT:  Head: Normocephalic and atraumatic.  Right Ear: External ear normal.  Left Ear: External ear normal.  Nose: Nose normal.  Mouth/Throat: Oropharynx is clear and moist. No oropharyngeal exudate.  Eyes: Pupils are equal, round, and reactive to light. Conjunctivae and EOM are normal. Left eye exhibits no discharge. No scleral icterus.  Neck: Normal range of motion. Neck supple. No JVD present.  Cardiovascular: Normal rate and regular rhythm. Exam reveals no  friction rub.  No murmur heard. Pulmonary/Chest: Effort normal and breath sounds normal. No respiratory distress. She has no wheezes. She has no rales. She exhibits no tenderness.  Abdominal: Soft. Bowel sounds are normal. She exhibits no distension and no mass. There is no abdominal tenderness. There is no rebound and no guarding.  Musculoskeletal: Normal range of motion.        General: No tenderness, deformity or edema.  Lymphadenopathy:    She has no cervical adenopathy.  Neurological: She is alert and oriented to person, place, and time. No cranial nerve deficit. She exhibits normal muscle tone. Gait normal. Coordination normal. GCS score is 15.  Skin: Skin is warm and dry. No rash noted. She is not diaphoretic. No erythema.  Psychiatric: Affect and judgment normal.  Breast exam was performed in seated and lying down position. Patient is status post lumpectomy left breast with a well-healed surgical scar, fibrotic changes. No evidence of any palpable masses. No evidence of axillary adenopathy. She has focal radiation induced hyperpigmentation and also breast edema.   LABORATORY DATA: I have personally reviewed the data as listed: Surgical pathology 09/18/2016 SPECIMEN SUBMITTED:  A. Breast, left, UOQ  CLINICAL HISTORY:  Indeterminate calcifications  PRE-OPERATIVE DIAGNOSIS:  DCIS  POST-OPERATIVE DIAGNOSIS:  None provided.  DIAGNOSIS:  A. BREAST, LEFT UPPER OUTER QUADRANT; STEREOTACTIC BIOPSY:  - DUCTAL CARCINOMA IN SITU, INTERMEDIATE GRADE.  - CALCIFICATIONS ASSOCIATED WITH DCIS.     Surgical Pathology 10/16/2016  CASE: [id number]  PATIENT: Juliet Rude  Surgical Pathology Report  SPECIMEN SUBMITTED:  A. Breast mass, left  B. Sentinel node 1, left  DIAGNOSIS:  A. BREAST MASS, LEFT; NEEDLE LOCALIZED EXCISION:  - RESIDUAL DUCTAL CARCINOMA IN SITU.  - SEE CANCER SUMMARY BELOW.  - BIOPSY SITE CHANGE.   B. SENTINEL LYMPH NODE 1, LEFT; EXCISION:  - ONE LYMPH NODE NEGATIVE  FOR MALIGNANCY (0/1).   Surgical Pathology Cancer Case Summary   DUCTAL CARCINOMA IN SITU OF THE BREAST:  Procedure: Needle localized excision  Specimen Laterality: Left  Size (Extent) of DCIS: at least 7 mm  Histologic Type: Ductal carcinoma in situ (DCIS)  Nuclear Grade: 2  Necrosis: Not identified  Margins: Negative for DCIS    Distance from closest margin: 0.1 mm to superior margin  Regional Lymph nodes:  Total # lymph nodes examined: 1    # Sentinel lymph nodes examined:  1    # Lymph nodes with macrometastasis (>2.0 mm): 0    # Lymph nodes with isolated tumor cells (<0.2 mm): 0    # Lymph nodes with micrometastasis (>0.2 mm and <2.0 mm): 0    Size of largest metastatic deposit: Not applicable    Extranodal extension: Not applicable  Pathologic Stage Classification (pTNM, AJCC 8th Edition): pTis (DCIS)  pN0 (sn)              RADIOGRAPHIC STUDIES: I have personally reviewed the radiological images as listed and agree with the findings in the report Mammogram unilateral 09/08/2016  Mammogram screening bilateral 08/21/2016.  Finding' dictated in HPI.   03/29/2017  DEXA study: The BMD measured at Femur Neck Right is 0.602 g/cm2 with a T-score of -3.1. This patient is considered osteoporotic according to [facility]) criteria. Site Region Measured Measured WHO Young Adult BMD Date       Age      Classification T-score AP Spine L1-L4 5/29/[age] 64.9 Osteopenia -1.7 0.990 g/cm2 DualFemur Neck Right 03/29/2017 64.9 Osteoporosis -3.1 0.602 g/cm2D  ASSESSMENT/PLAN Cancer Staging Ductal carcinoma in situ (DCIS) of left breast Staging form: Breast, AJCC 8th Edition - Clinical stage from 09/21/2016: Stage 0 (cTis (DCIS), cN0, cM0, ER: Positive, PR: Positive, HER2: Negative) - Signed by Earlie Server, MD on 10/22/2016 - Pathologic stage from 10/16/2016: Stage 0 (pTis (DCIS), pN0(sn), cM0, ER: Positive, PR: Positive, HER2: Negative) - Signed by Earlie Server,  MD on 10/22/2016  .68 yo postmenapausal female with history of DCIS s/p lumpectomy presents to follow up on management of DCIS.   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 Osteoporosis without current pathological fracture, unspecified osteoporosis type   3. Normocytic anemia    # DCIS,Continue letrozole 2.5 mg daily to complete total of 5 years.  She tolerates well with manageable hot flashes.    annual diagnostic mammogram was reviewed and discussed with patient.  No evidence of  #Anemia, hemoglobin 11.1, worsened from previously. I will add  iron panel. Check folate and vitamin b12 at next visit.   # Osteoporosis, recommend to continue vitamin D and calcium supplementation.   She is aware of her high risk of pathological fractures. Previously discussed with her about starting bisphosphonate for osteoporosis treatments.  She is concerned about side effects and declined.  All questions were answered. The patient knows to call the clinic with any problems, questions or concerns. Follow up in  6 months.   Earlie Server, MD, PhD Hematology Oncology [facility] Pager- [phone] 11/25/2018

## 2019-01-07 ENCOUNTER — Encounter (INDEPENDENT_AMBULATORY_CARE_PROVIDER_SITE_OTHER): Payer: Self-pay

## 2019-01-09 ENCOUNTER — Encounter: Payer: Self-pay | Admitting: *Deleted

## 2019-01-09 ENCOUNTER — Other Ambulatory Visit: Payer: Self-pay

## 2019-01-13 ENCOUNTER — Other Ambulatory Visit
Admission: RE | Admit: 2019-01-13 | Discharge: 2019-01-13 | Disposition: A | Discharge status: Home / Self Care | Payer: Medicare Other | Source: Ambulatory Visit | Attending: Otolaryngology | Admitting: Otolaryngology

## 2019-01-13 ENCOUNTER — Other Ambulatory Visit: Payer: Self-pay

## 2019-01-13 DIAGNOSIS — Z20828 Contact with and (suspected) exposure to other viral communicable diseases: Secondary | ICD-10-CM | POA: Diagnosis not present

## 2019-01-13 DIAGNOSIS — Z01812 Encounter for preprocedural laboratory examination: Secondary | ICD-10-CM | POA: Insufficient documentation

## 2019-01-14 LAB — SARS CORONAVIRUS 2 (TAT 6-24 HRS): SARS Coronavirus 2: NEGATIVE

## 2019-01-16 NOTE — Discharge Instructions (Signed)
[facility] °[facility] °ENDOSCOPIC SINUS SURGERY °[facility] ° °What is Functional Endoscopic Sinus Surgery? ° The Surgery involves making the natural openings of the sinuses larger by removing the bony partitions that separate the sinuses from the nasal cavity.  The natural sinus lining is preserved as much as possible to allow the sinuses to resume normal function after the surgery.  In some patients nasal polyps (excessively swollen lining of the sinuses) may be removed to relieve obstruction of the sinus openings.  The surgery is performed through the nose using lighted scopes, which eliminates the need for incisions on the face.  A septoplasty is a different procedure which is sometimes performed with sinus surgery.  It involves straightening the boy partition that separates the two sides of your nose.  A crooked or deviated septum may need repair if is obstructing the sinuses or nasal airflow.  Turbinate reduction is also often performed during sinus surgery.  The turbinates are bony proturberances from the side walls of the nose which swell and can obstruct the nose in patients with sinus and allergy problems.  Their size can be surgically reduced to help relieve nasal obstruction. ° °What Can Sinus Surgery Do For Me? ° Sinus surgery can reduce the frequency of sinus infections requiring antibiotic treatment.  This can provide improvement in nasal congestion, post-nasal drainage, facial pressure and nasal obstruction.  Surgery will NOT prevent you from ever having an infection again, so it usually only for patients who get infections 4 or more times yearly requiring antibiotics, or for infections that do not clear with antibiotics.  It will not cure nasal allergies, so patients with allergies may still require medication to treat their allergies after surgery. Surgery may improve headaches related to sinusitis, however, some people will continue to  require medication to control sinus headaches related to allergies.  Surgery will do nothing for other forms of headache (migraine, tension or cluster). ° °What Are the Risks of Endoscopic Sinus Surgery? ° Current techniques allow surgery to be performed safely with little risk, however, there are rare complications that patients should be aware of.  Because the sinuses are located around the eyes, there is risk of eye injury, including blindness, though again, this would be quite rare. This is usually a result of bleeding behind the eye during surgery, which puts the vision oat risk, though there are treatments to protect the vision and prevent permanent disrupted by surgery causing a leak of the spinal fluid that surrounds the brain.  More serious complications would include bleeding inside the brain cavity or damage to the brain.  Again, all of these complications are uncommon, and spinal fluid leaks can be safely managed surgically if they occur.  The most common complication of sinus surgery is bleeding from the nose, which may require packing or cauterization of the nose.  Continued sinus have polyps may experience recurrence of the polyps requiring revision surgery.  Alterations of sense of smell or injury to the tear ducts are also rare complications.  ° °What is the Surgery Like, and what is the Recovery? ° The Surgery usually takes a couple of hours to perform, and is usually performed under a general anesthetic (completely asleep).  Patients are usually discharged home after a couple of hours.  Sometimes during surgery it is necessary to pack the nose to control bleeding, and the packing is left in place for 24 - 48 hours, and removed by your surgeon.    If a septoplasty was performed during the procedure, there is often a splint placed which must be removed after 5-7 days.   °Discomfort: Pain is usually mild to moderate, and can be controlled by prescription pain medication or acetaminophen (Tylenol).   Aspirin, Ibuprofen (Advil, Motrin), or Naprosyn (Aleve) should be avoided, as they can cause increased bleeding.  Most patients feel sinus pressure like they have a bad head cold for several days.  Sleeping with your head elevated can help reduce swelling and facial pressure, as can ice packs over the face.  A humidifier may be helpful to keep the mucous and blood from drying in the nose.  ° °Diet: There are no specific diet restrictions, however, you should generally start with clear liquids and a light diet of bland foods because the anesthetic can cause some nausea.  Advance your diet depending on how your stomach feels.  Taking your pain medication with food will often help reduce stomach upset which pain medications can cause. ° °Nasal Saline Irrigation: It is important to remove blood clots and dried mucous from the nose as it is healing.  This is done by having you irrigate the nose at least 3 - 4 times daily with a salt water solution.  We recommend using NeilMed Sinus Rinse (available at the drug store).  Fill the squeeze bottle with the solution, bend over a sink, and insert the tip of the squeeze bottle into the nose ½ of an inch.  Point the tip of the squeeze bottle towards the inside corner of the eye on the same side your irrigating.  Squeeze the bottle and gently irrigate the nose.  If you bend forward as you do this, most of the fluid will flow back out of the nose, instead of down your throat.   The solution should be warm, near body temperature, when you irrigate.   Each time you irrigate, you should use a full squeeze bottle.  ° °Note that if you are instructed to use Nasal Steroid Sprays at any time after your surgery, irrigate with saline BEFORE using the steroid spray, so you do not wash it all out of the nose. °Another product, Nasal Saline Gel (such as AYR Nasal Saline Gel) can be applied in each nostril 3 - 4 times daily to moisture the nose and reduce scabbing or crusting. ° °Bleeding:   Bloody drainage from the nose can be expected for several days, and patients are instructed to irrigate their nose frequently with salt water to help remove mucous and blood clots.  The drainage may be dark red or brown, though some fresh blood may be seen intermittently, especially after irrigation.  Do not blow you nose, as bleeding may occur. If you must sneeze, keep your mouth open to allow air to escape through your mouth. ° °If heavy bleeding occurs: Irrigate the nose with saline to rinse out clots, then spray the nose 3 - 4 times with Afrin Nasal Decongestant Spray.  The spray will constrict the blood vessels to slow bleeding.  Pinch the lower half of your nose shut to apply pressure, and lay down with your head elevated.  Ice packs over the nose may help as well. If bleeding persists despite these measures, you should notify your doctor.  Do not use the Afrin routinely to control nasal congestion after surgery, as it can result in worsening congestion and may affect healing.  ° ° ° °Activity: Return to work varies among patients. Most patients will be   out of work at least 5 - 7 days to recover.  Patient may return to work after they are off of narcotic pain medication, and feeling well enough to perform the functions of their job.  Patients must avoid heavy lifting (over 10 pounds) or strenuous physical for 2 weeks after surgery, so your employer may need to assign you to light duty, or keep you out of work longer if light duty is not possible.  NOTE: you should not drive, operate dangerous machinery, do any mentally demanding tasks or make any important legal or financial decisions while on narcotic pain medication and recovering from the general anesthetic.  °  °Call Your Doctor Immediately if You Have Any of the Following: °1. Bleeding that you cannot control with the above measures °2. Loss of vision, double vision, bulging of the eye or black eyes. °3. Fever over 101 degrees °4. Neck stiffness with  severe headache, fever, nausea and change in mental state. °You are always encourage to call anytime with concerns, however, please call with requests for pain medication refills during office hours. ° °Office Endoscopy: During follow-up visits your doctor will remove any packing or splints that may have been placed and evaluate and clean your sinuses endoscopically.  Topical anesthetic will be used to make this as comfortable as possible, though you may want to take your pain medication prior to the visit.  How often this will need to be done varies from patient to patient.  After complete recovery from the surgery, you may need follow-up endoscopy from time to time, particularly if there is concern of recurrent infection or nasal polyps. ° ° °General Anesthesia, Adult, Care After °This sheet gives you information about how to care for yourself after your procedure. Your health care provider may also give you more specific instructions. If you have problems or questions, contact your health care provider. °What can I expect after the procedure? °After the procedure, the following side effects are common: °· Pain or discomfort at the IV site. °· Nausea. °· Vomiting. °· Sore throat. °· Trouble concentrating. °· Feeling cold or chills. °· Weak or tired. °· Sleepiness and fatigue. °· Soreness and body aches. These side effects can affect parts of the body that were not involved in surgery. °Follow these instructions at home: ° °For at least 24 hours after the procedure: °· Have a responsible adult stay with you. It is important to have someone help care for you until you are awake and alert. °· Rest as needed. °· Do not: °? Participate in activities in which you could fall or become injured. °? Drive. °? Use heavy machinery. °? Drink alcohol. °? Take sleeping pills or medicines that cause drowsiness. °? Make important decisions or sign legal documents. °? Take care of children on your own. °Eating and  drinking °· Follow any instructions from your health care provider about eating or drinking restrictions. °· When you feel hungry, start by eating small amounts of foods that are soft and easy to digest (bland), such as toast. Gradually return to your regular diet. °· Drink enough fluid to keep your urine pale yellow. °· If you vomit, rehydrate by drinking water, juice, or clear broth. °General instructions °· If you have sleep apnea, surgery and certain medicines can increase your risk for breathing problems. Follow instructions from your health care provider about wearing your sleep device: °? Anytime you are sleeping, including during daytime naps. °? While taking prescription pain medicines, sleeping medicines, or medicines   that make you drowsy. °· Return to your normal activities as told by your health care provider. Ask your health care provider what activities are safe for you. °· Take over-the-counter and prescription medicines only as told by your health care provider. °· If you smoke, do not smoke without supervision. °· Keep all follow-up visits as told by your health care provider. This is important. °Contact a health care provider if: °· You have nausea or vomiting that does not get better with medicine. °· You cannot eat or drink without vomiting. °· You have pain that does not get better with medicine. °· You are unable to pass urine. °· You develop a skin rash. °· You have a fever. °· You have redness around your IV site that gets worse. °Get help right away if: °· You have difficulty breathing. °· You have chest pain. °· You have blood in your urine or stool, or you vomit blood. °Summary °· After the procedure, it is common to have a sore throat or nausea. It is also common to feel tired. °· Have a responsible adult stay with you for the first 24 hours after general anesthesia. It is important to have someone help care for you until you are awake and alert. °· When you feel hungry, start by eating  small amounts of foods that are soft and easy to digest (bland), such as toast. Gradually return to your regular diet. °· Drink enough fluid to keep your urine pale yellow. °· Return to your normal activities as told by your health care provider. Ask your health care provider what activities are safe for you. °This information is not intended to replace advice given to you by your health care provider. Make sure you discuss any questions you have with your health care provider. °Document Released: 06/01/2000 Document Revised: 02/26/2017 Document Reviewed: 10/09/2016 °Elsevier Patient Education © 2020 [employer] ° °

## 2019-01-17 ENCOUNTER — Encounter
Admission: RE | Disposition: A | Discharge status: Home / Self Care | Payer: Self-pay | Source: Home / Self Care | Attending: Otolaryngology

## 2019-01-17 ENCOUNTER — Ambulatory Visit
Admission: RE | Admit: 2019-01-17 | Discharge: 2019-01-17 | Disposition: A | Discharge status: Home / Self Care | Payer: Medicare Other | Attending: Otolaryngology | Admitting: Otolaryngology

## 2019-01-17 ENCOUNTER — Ambulatory Visit: Payer: Medicare Other | Admitting: Anesthesiology

## 2019-01-17 ENCOUNTER — Other Ambulatory Visit: Payer: Self-pay

## 2019-01-17 DIAGNOSIS — J329 Chronic sinusitis, unspecified: Secondary | ICD-10-CM | POA: Diagnosis present

## 2019-01-17 DIAGNOSIS — Z79811 Long term (current) use of aromatase inhibitors: Secondary | ICD-10-CM | POA: Diagnosis not present

## 2019-01-17 DIAGNOSIS — M1990 Unspecified osteoarthritis, unspecified site: Secondary | ICD-10-CM | POA: Diagnosis not present

## 2019-01-17 DIAGNOSIS — K219 Gastro-esophageal reflux disease without esophagitis: Secondary | ICD-10-CM | POA: Diagnosis not present

## 2019-01-17 DIAGNOSIS — J301 Allergic rhinitis due to pollen: Secondary | ICD-10-CM | POA: Diagnosis not present

## 2019-01-17 DIAGNOSIS — Z79899 Other long term (current) drug therapy: Secondary | ICD-10-CM | POA: Diagnosis not present

## 2019-01-17 DIAGNOSIS — Z882 Allergy status to sulfonamides status: Secondary | ICD-10-CM | POA: Insufficient documentation

## 2019-01-17 DIAGNOSIS — J338 Other polyp of sinus: Secondary | ICD-10-CM | POA: Insufficient documentation

## 2019-01-17 DIAGNOSIS — Z79890 Hormone replacement therapy (postmenopausal): Secondary | ICD-10-CM | POA: Diagnosis not present

## 2019-01-17 DIAGNOSIS — Z853 Personal history of malignant neoplasm of breast: Secondary | ICD-10-CM | POA: Diagnosis not present

## 2019-01-17 DIAGNOSIS — E785 Hyperlipidemia, unspecified: Secondary | ICD-10-CM | POA: Insufficient documentation

## 2019-01-17 DIAGNOSIS — E039 Hypothyroidism, unspecified: Secondary | ICD-10-CM | POA: Insufficient documentation

## 2019-01-17 HISTORY — PX: IMAGE GUIDED SINUS SURGERY: SHX6570

## 2019-01-17 HISTORY — PX: FRONTAL SINUS EXPLORATION: SHX6591

## 2019-01-17 HISTORY — PX: MAXILLARY ANTROSTOMY: SHX2003

## 2019-01-17 HISTORY — PX: SPHENOIDECTOMY: SHX2421

## 2019-01-17 HISTORY — PX: ETHMOIDECTOMY: SHX5197

## 2019-01-17 SURGERY — SINUS SURGERY, WITH IMAGING GUIDANCE
Anesthesia: General | Site: Nose

## 2019-01-17 MED ORDER — OXYMETAZOLINE HCL 0.05 % NA SOLN
NASAL | Status: DC | PRN
  Administered 2019-01-17: 1 via TOPICAL

## 2019-01-17 MED ORDER — FENTANYL CITRATE (PF) 100 MCG/2ML IJ SOLN
INTRAMUSCULAR | Status: DC | PRN
  Administered 2019-01-17 (×6): 25 ug via INTRAVENOUS
  Administered 2019-01-17: 50 ug via INTRAVENOUS

## 2019-01-17 MED ORDER — EPHEDRINE SULFATE 50 MG/ML IJ SOLN
INTRAMUSCULAR | Status: DC | PRN
  Administered 2019-01-17: 10 mg via INTRAVENOUS

## 2019-01-17 MED ORDER — LIDOCAINE-EPINEPHRINE 1 %-1:100000 IJ SOLN
INTRAMUSCULAR | Status: DC | PRN
  Administered 2019-01-17: 10 mL

## 2019-01-17 MED ORDER — PREDNISONE 10 MG (48) PO TBPK: ORAL_TABLET | ORAL | 0 refills | Status: DC

## 2019-01-17 MED ORDER — HYDROCODONE-ACETAMINOPHEN 5-325 MG PO TABS: 1.0000 | ORAL_TABLET | Freq: Four times a day (QID) | ORAL | 0 refills | Status: DC | PRN

## 2019-01-17 MED ORDER — LACTATED RINGERS IV SOLN
INTRAVENOUS | Status: DC
  Administered 2019-01-17 (×2): via INTRAVENOUS

## 2019-01-17 MED ORDER — CEFDINIR 300 MG PO CAPS: 300.0000 mg | ORAL_CAPSULE | Freq: Two times a day (BID) | ORAL | 0 refills | Status: DC

## 2019-01-17 MED ORDER — PROPOFOL 10 MG/ML IV BOLUS
INTRAVENOUS | Status: DC | PRN
  Administered 2019-01-17: 130 mg via INTRAVENOUS
  Administered 2019-01-17: 50 mg via INTRAVENOUS

## 2019-01-17 MED ORDER — SUCCINYLCHOLINE CHLORIDE 20 MG/ML IJ SOLN
INTRAMUSCULAR | Status: DC | PRN
  Administered 2019-01-17: 80 mg via INTRAVENOUS

## 2019-01-17 MED ORDER — ONDANSETRON HCL 4 MG/2ML IJ SOLN
INTRAMUSCULAR | Status: DC | PRN
  Administered 2019-01-17: 4 mg via INTRAVENOUS

## 2019-01-17 MED ORDER — OXYCODONE HCL 5 MG/5ML PO SOLN
5.0000 mg | Freq: Once | ORAL | Status: AC | PRN
  Administered 2019-01-17: 5 mg via ORAL

## 2019-01-17 MED ORDER — MIDAZOLAM HCL 5 MG/5ML IJ SOLN
INTRAMUSCULAR | Status: DC | PRN
  Administered 2019-01-17: 2 mg via INTRAVENOUS

## 2019-01-17 MED ORDER — DEXAMETHASONE SODIUM PHOSPHATE 4 MG/ML IJ SOLN
INTRAMUSCULAR | Status: DC | PRN
  Administered 2019-01-17: 10 mg via INTRAVENOUS

## 2019-01-17 MED ORDER — LIDOCAINE HCL 4 % MT SOLN
OROMUCOSAL | Status: DC | PRN
  Administered 2019-01-17: 4 mL via TOPICAL

## 2019-01-17 MED ORDER — ACETAMINOPHEN 10 MG/ML IV SOLN
1000.0000 mg | Freq: Once | INTRAVENOUS | Status: AC
  Administered 2019-01-17: 1000 mg via INTRAVENOUS

## 2019-01-17 MED ORDER — LIDOCAINE HCL (CARDIAC) PF 100 MG/5ML IV SOSY
PREFILLED_SYRINGE | INTRAVENOUS | Status: DC | PRN
  Administered 2019-01-17: 50 mg via INTRAVENOUS

## 2019-01-17 MED ORDER — OXYCODONE HCL 5 MG PO TABS: 5.0000 mg | ORAL_TABLET | Freq: Once | ORAL | Status: AC | PRN

## 2019-01-17 MED ORDER — GLYCOPYRROLATE 0.2 MG/ML IJ SOLN
INTRAMUSCULAR | Status: DC | PRN
  Administered 2019-01-17: 0.1 mg via INTRAVENOUS

## 2019-01-17 SURGICAL SUPPLY — 22 items
BATTERY INSTRU NAVIGATION (MISCELLANEOUS) ×15 IMPLANT
CANISTER SUCT 1200ML W/VALVE (MISCELLANEOUS) ×5 IMPLANT
COAG SUCT 10F 3.5MM HAND CTRL (MISCELLANEOUS) ×5 IMPLANT
ELECT REM PT RETURN 9FT ADLT (ELECTROSURGICAL) ×5
ELECTRODE REM PT RTRN 9FT ADLT (ELECTROSURGICAL) ×3 IMPLANT
GLOVE BIO SURGEON STRL SZ7.5 (GLOVE) ×10 IMPLANT
GOWN STRL REUS W/ TWL LRG LVL3 (GOWN DISPOSABLE) ×3 IMPLANT
GOWN STRL REUS W/TWL LRG LVL3 (GOWN DISPOSABLE) ×2
IV NS 500ML (IV SOLUTION) ×2
IV NS 500ML BAXH (IV SOLUTION) ×3 IMPLANT
KIT TURNOVER KIT A (KITS) ×5 IMPLANT
NS IRRIG 500ML POUR BTL (IV SOLUTION) ×5 IMPLANT
PACK ENT CUSTOM (PACKS) ×5 IMPLANT
PACKING NASAL EPIS 4X2.4 XEROG (MISCELLANEOUS) ×10 IMPLANT
PATTIES SURGICAL .5 X3 (DISPOSABLE) ×5 IMPLANT
SHAVER DIEGO BLD STD TYPE A (BLADE) ×5 IMPLANT
SOL ANTI-FOG 6CC FOG-OUT (MISCELLANEOUS) ×3 IMPLANT
SOL FOG-OUT ANTI-FOG 6CC (MISCELLANEOUS) ×2
SYR 10ML LL (SYRINGE) ×5 IMPLANT
TRACKER CRANIALMASK (MASK) ×5 IMPLANT
TUBING DECLOG MULTIDEBRIDER (TUBING) ×5 IMPLANT
WATER STERILE IRR 250ML POUR (IV SOLUTION) ×5 IMPLANT

## 2019-01-17 NOTE — Anesthesia Preprocedure Evaluation (Signed)
Anesthesia Evaluation  Patient identified by MRN, date of birth, ID band Patient awake    Reviewed: NPO status   History of Anesthesia Complications Negative for: history of anesthetic complications  Airway Mallampati: II  TM Distance: >3 FB Neck ROM: full    Dental no notable dental hx.    Pulmonary neg pulmonary ROS,    Pulmonary exam normal        Cardiovascular Exercise Tolerance: Good negative cardio ROS Normal cardiovascular exam     Neuro/Psych negative neurological ROS  negative psych ROS   GI/Hepatic Neg liver ROS, GERD  Controlled,  Endo/Other  Hypothyroidism   Renal/GU negative Renal ROS  negative genitourinary   Musculoskeletal  (+) Arthritis ,   Abdominal   Peds  Hematology L breast cancer > Lumpectomy. NO IV or BP on L arm   Anesthesia Other Findings Covid: NEG.  Reproductive/Obstetrics                             Anesthesia Physical Anesthesia Plan  ASA: II  Anesthesia Plan: General ETT   Post-op Pain Management:    Induction:   PONV Risk Score and Plan: 2 and 3 and Ondansetron, Midazolam and Dexamethasone  Airway Management Planned:   Additional Equipment:   Intra-op Plan:   Post-operative Plan:   Informed Consent: I have reviewed the patients History and Physical, chart, labs and discussed the procedure including the risks, benefits and alternatives for the proposed anesthesia with the patient or authorized representative who has indicated his/her understanding and acceptance.       Plan Discussed with: CRNA  Anesthesia Plan Comments:         Anesthesia Quick Evaluation

## 2019-01-17 NOTE — Op Note (Signed)
01/17/2019  10:47 AM    Mallory Leblanc  [id number]   Pre-Op Diagnosis:  chronic sinusitis, nasal polyps  Post-op Diagnosis: chronic sinusitis, nasal polyps  Procedure:  1)  Image Guided Sinus Surgery,   2)  Bilateral Endoscopic Maxillary Antrostomy with Tissue Removal   3)  Bilateral Frontal Sinusotomy   4)  Bilateral Total Ethmoidectomy   5)  Left Sphenoidotomy with tissue removal    Surgeon:  Riley Nearing  Anesthesia:  General endotracheal  EBL:  [id number]  Complications:  None  Findings: Bilateral nasal polyps with dense yellow secretions in the maxillary sinuses, worse on the right  Procedure: After the patient was identified in holding and the benefits of the procedure were reviewed as well as the consent and risks, the patient was taken to the operating room and with the patient in a comfortable supine position,  general orotracheal anesthesia was induced without difficulty.  A proper time-out was performed.  The [employer] image guidance system was set up and calibrated in the normal fashion and felt to be acceptable.  Next 1% Xylocaine with 1:100,000 epinephrine was infiltrated into the inferior turbinates, septum, and anterior middle turbinates bilaterally.  Several minutes were allowed for this to take effect.  Cottoniod pledgets soaked in Afrin were placed into both nasal cavities and left while the patient was prepped and draped in the standard fashion. The image guided suction was calibrated and used to inspect known points in the nasal cavity to assess accuracy of the image guided system. Accuracy was felt to be excellent.   The left middle turbinate was incised with a sickle knife and the lateral wall of a concha bullosa resected along with middle meatus polyps using the microdebrider. Next the uncinate process was resected with through-cutting forceps as well as the microdebrider. In this fashion the uncinate was completely removed along with polypoid soft tissue and bone of  the medial wall of the maxillary sinus to create a large patent maxillary antrostomy. The left maxillary sinus was suctioned to clear secretions. Small polyps within the sinus were debrided and dense mucoid debris irrigated out.  Next the left anterior ethmoid sinuses were dissected beginning inferomedially, entering the ethmoid bulla. Thru cut forceps were used to open the anterior ethmoids. The microdebrider was used as needed to debride polyps.  Next the basal lamella was entered and, working back in a sequential fashion through the ethmoid air cells, the ethmoid sinuses were dissected to the posterior ethmoid sinuses, utilizing the image guided suction and the whole time to reassess the anatomy frequently. Thru cutting forceps were used for this dissection, along with careful use of the debrider. Care was taken to avoid injury to the lamina papyracea laterally and the skull base superiorly.   Dissection proceeded anteriorly and superiorly into the left frontal recess which was dissected utilizing a 30 and then a 70 scope and frontal curved instruments. The curved image guided suction was used during this dissection to frequently reassess the anatomy on the CT scan. The frontal recess was dissected, removing polyps, until a suction could be passed up into the region of the frontal sinus. A wide opening into the frontal sinus was created.   Next the scope was passed medial to the middle turbinate and the left sphenoid recess inspected. Polyps with debrided from this area with the microdebrider. With the assistance of the image guided system, the sphenoid sinus was carefully entered through some thin bone at the anterior face of the spenoid,  medial to the superior turbinate, and widely opened with through-cutting sphenoid punch forceps, removing small polyps. Mucus was suctioned from the sphenoid sinus.  Attention was then turned to the right side where the same procedure was performed, opening the  maxillary sinus, ethmoid cavities, and the frontal recess in the same fashion as described above. The right sphenoid sinus was not opened and the recess on that side was free of polyps. The right maxillary was particularly full of dense debris, and extensive irrigation was used until this was cleared.   The nose was suctioned and inspected. Xerogel absorbable sinus packing was then placed in the ethmoid cavities bilaterally.   The patient was then returned to the anesthesiologist for awakening and taken to recovery room in good condition postoperatively.  Disposition:   PACU and d/c home  Plan: Ice, elevation, narcotic analgesia, steroids, and prophylactic antibiotics. Begin sinus irrigations with saline tomrrow, irrigating 3-4 times daily. Return to the office in 7 days.  Return to work in 7-10 days, no strenuous activities for two weeks.   Riley Nearing 01/17/2019 10:47 AM

## 2019-01-17 NOTE — Transfer of Care (Signed)
Immediate Anesthesia Transfer of Care Note  Patient: Mallory Leblanc  Procedure(s) Performed: IMAGE GUIDED SINUS SURGERY (N/A Nose) ETHMOIDECTOMY (Bilateral Nose) MAXILLARY ANTROSTOMY (Bilateral Nose) SPHENOIDOTOMY (Left Nose) FRONTAL SINUS EXPLORATION (Bilateral Nose)  Patient Location: PACU  Anesthesia Type: General ETT  Level of Consciousness: awake, alert  and patient cooperative  Airway and Oxygen Therapy: Patient Spontanous Breathing and Patient connected to supplemental oxygen  Post-op Assessment: Post-op Vital signs reviewed, Patient's Cardiovascular Status Stable, Respiratory Function Stable, Patent Airway and No signs of Nausea or vomiting  Post-op Vital Signs: Reviewed and stable  Complications: No apparent anesthesia complications

## 2019-01-17 NOTE — Anesthesia Postprocedure Evaluation (Signed)
Anesthesia Post Note  Patient: Mallory Leblanc  Procedure(s) Performed: IMAGE GUIDED SINUS SURGERY (N/A Nose) ETHMOIDECTOMY (Bilateral Nose) MAXILLARY ANTROSTOMY (Bilateral Nose) SPHENOIDOTOMY (Left Nose) FRONTAL SINUS EXPLORATION (Bilateral Nose)     Patient location during evaluation: PACU Anesthesia Type: General Level of consciousness: awake and alert Pain management: pain level controlled Vital Signs Assessment: post-procedure vital signs reviewed and stable Respiratory status: spontaneous breathing, nonlabored ventilation, respiratory function stable and patient connected to nasal cannula oxygen Cardiovascular status: blood pressure returned to baseline and stable Postop Assessment: no apparent nausea or vomiting Anesthetic complications: no    Mallory Leblanc

## 2019-01-17 NOTE — Anesthesia Procedure Notes (Signed)
Procedure Name: Intubation Date/Time: 01/17/2019 7:45 AM Performed by: Mayme Genta, CRNA Pre-anesthesia Checklist: Patient identified, Emergency Drugs available, Suction available, Patient being monitored and Timeout performed Patient Re-evaluated:Patient Re-evaluated prior to induction Oxygen Delivery Method: Circle system utilized Preoxygenation: Pre-oxygenation with 100% oxygen Induction Type: IV induction Ventilation: Mask ventilation without difficulty Laryngoscope Size: Miller and 2 Grade View: Grade I Tube type: Oral Rae Tube size: 7.0 mm Number of attempts: 1 Airway Equipment and Method: Bougie stylet Placement Confirmation: ETT inserted through vocal cords under direct vision,  positive ETCO2 and breath sounds checked- equal and bilateral Tube secured with: Tape Dental Injury: Teeth and Oropharynx as per pre-operative assessment  Comments: Grade I view. Limited neck mobility. Unable to insert Oral Rae ETT. Boujie inserted without difficulty and ETT passed over Boujie without difficulty. Tolerated well by pt.

## 2019-01-17 NOTE — H&P (Signed)
History and physical reviewed and will be scanned in later. No change in medical status reported by the patient or family, appears stable for surgery. All questions regarding the procedure answered, and patient (or family if a child) expressed understanding of the procedure. ? ?Mallory Leblanc S [name] ?@[email or web address]@ ?

## 2019-01-18 ENCOUNTER — Encounter: Payer: Self-pay | Admitting: Otolaryngology

## 2019-01-20 LAB — SURGICAL PATHOLOGY

## 2019-03-18 ENCOUNTER — Other Ambulatory Visit: Payer: Self-pay | Admitting: Primary Care

## 2019-03-18 DIAGNOSIS — E785 Hyperlipidemia, unspecified: Secondary | ICD-10-CM

## 2019-03-27 ENCOUNTER — Telehealth: Payer: Self-pay

## 2019-03-27 NOTE — Telephone Encounter (Signed)
Patient contacted the office. She states she was last here for a TSH check on 11/04/18. She states she is wondering when she is needing to follow up on this? Please advise.

## 2019-03-27 NOTE — Telephone Encounter (Signed)
Spoken and notified patient of Mallory Leblanc comments. Patient verbalized understanding. CPE on 06/05/2019

## 2019-03-27 NOTE — Telephone Encounter (Signed)
She probably needs levels rechecked in March. It looks like she should schedule a physical with Anda Kraft and levels can be rechecked at that time.

## 2019-05-01 ENCOUNTER — Encounter: Payer: Self-pay | Admitting: Family Medicine

## 2019-05-01 ENCOUNTER — Ambulatory Visit (INDEPENDENT_AMBULATORY_CARE_PROVIDER_SITE_OTHER): Payer: Medicare PPO | Admitting: Family Medicine

## 2019-05-01 ENCOUNTER — Other Ambulatory Visit: Payer: Self-pay

## 2019-05-01 VITALS — BP 150/84 | HR 77 | Temp 99.2°F | Ht 62.0 in | Wt 148.2 lb

## 2019-05-01 DIAGNOSIS — M170 Bilateral primary osteoarthritis of knee: Secondary | ICD-10-CM | POA: Diagnosis not present

## 2019-05-01 MED ORDER — HYLAN G-F 20 48 MG/6ML IX SOSY
48.0000 mg | PREFILLED_SYRINGE | Freq: Once | INTRA_ARTICULAR | Status: AC
  Administered 2019-05-01: 48 mg via INTRA_ARTICULAR

## 2019-05-01 NOTE — Progress Notes (Signed)
Mallory Nass T. Yasser [name], MD Primary Care and Sports Medicine [facility] at [facility] [place], [address] Phone: [phone]  FAX: [name]  MRN [id number]  Date of Birth: 05-Mar-1951  Visit Date: 05/01/2019  PCP: Pleas Koch, NP  Referred by: Pleas Koch, NP  Chief Complaint  Patient presents with  . Knee Pain    RIght-She would like to get bilateral synvisc injections    This visit occurred during the SARS-CoV-2 public health emergency.  Safety protocols were in place, including screening questions prior to the visit, additional usage of staff PPE, and extensive cleaning of exam room while observing appropriate contact time as indicated for disinfecting solutions.   Subjective:   Mallory Leblanc is a 68 y.o. very pleasant female patient with Body mass index is 27.12 kg/m. who presents with the following:  ? B synvisc injections.   3 year response after her prior injection.   She has had a fantastic response to Synvisc injections in the past.  I did do a left-sided Synvisc 1 injection 2-1/2 years ago, and she had a great relief of symptoms, greater than 2 years after this injection.  She also had an injection 6 years ago at [place] clinic, and she had a multiyear relief of symptoms with this.  She presents today with some bilateral knee pain.  This is progressed over the last couple of months.  Review of Systems is noted in the HPI, as appropriate   Objective:   BP (!) 150/84   Pulse 77   Temp 99.2 F (37.3 C) (Temporal)   Ht 5\' 2"  (1.575 m)   Wt 148 lb 4 oz (67.2 kg)   SpO2 99%   BMI 27.12 kg/m   GEN: No acute distress; alert,appropriate. PULM: Breathing comfortably in no respiratory distress PSYCH: Normally interactive.    Radiology: No results found.  Assessment and Plan:     ICD-10-CM   1. Primary osteoarthritis of knees, bilateral  M17.0 Hylan SOSY 48 mg    Hylan  SOSY 48 mg   Procedure only:  An ABN was signed for this procedure.  Aspiration/Injection Procedure Note Mallory Leblanc 01/03/1951 Date of procedure: 05/01/2019  Procedure: Large Joint Aspiration / Injection of Knee for Viscosupplementation, right Medication: Synvisc-One Indications: Pain  Procedure Details Patient verbally consented to procedure. Risks (including infection), benefits, and alternatives explained. Sterilely prepped with Chloraprep. Ethyl cholride used for anesthesia, then 7 cc of Lidocaine 1% used for anesthesia in the anterolateral position. Reprepped with Chloraprep.  Anterolateral approach used to inject joint without difficulty, injected with Synvisc-One, 6 mL. No complications with procedure and tolerated well.  Aspiration/Injection Procedure Note Mallory Leblanc September 29, 1950 Date of procedure: 05/01/2019  Procedure: Large Joint Aspiration / Injection of Knee for Viscosupplementation, left Medication: Synvisc-One Indications: Pain  Procedure Details Patient verbally consented to procedure. Risks (including infection), benefits, and alternatives explained. Sterilely prepped with Chloraprep. Ethyl cholride used for anesthesia, then 7 cc of Lidocaine 1% used for anesthesia in the anterolateral position. Reprepped with Chloraprep.  Anterolateral approach used to inject joint without difficulty, injected with Synvisc-One, 6 mL. No complications with procedure and tolerated well.   Follow-up: No follow-ups on file.  Meds ordered this encounter  Medications  . Hylan SOSY 48 mg  . Hylan SOSY 48 mg   Medications Discontinued During This Encounter  Medication Reason  . cefdinir (OMNICEF) 300 MG capsule Completed  Course  . predniSONE (STERAPRED UNI-PAK 48 TAB) 10 MG (48) TBPK tablet Completed Course   No orders of the defined types were placed in this encounter.   Signed,  Maud Deed. Elbony Mcclimans, MD   Outpatient Encounter Medications as of 05/01/2019  Medication Sig   . acetaminophen (TYLENOL) 500 MG tablet Take 1,000 mg by mouth every 8 (eight) hours as needed for mild pain or moderate pain.  Marland Kitchen atorvastatin (LIPITOR) 10 MG tablet Take 1 tablet (10 mg total) by mouth daily. NEEDS PHYSICAL EXAM  . Cholecalciferol (D3 VITAMIN PO) Take by mouth.  . fluticasone (FLONASE) 50 MCG/ACT nasal spray Place 1 spray into both nostrils 2 (two) times daily.  Marland Kitchen HYDROcodone-acetaminophen (NORCO/VICODIN) 5-325 MG tablet Take 1-2 tablets by mouth every 6 (six) hours as needed for moderate pain.  Marland Kitchen letrozole (FEMARA) 2.5 MG tablet Take 1 tablet (2.5 mg total) by mouth daily.  Marland Kitchen levocetirizine (XYZAL) 5 MG tablet TAKE 1 TABLET BY MOUTH ONCE EVERY EVENING FOR ALLERGIES  . levothyroxine (SYNTHROID) 100 MCG tablet Take 1 tablet by mouth every morning on an empty stomach with a full glass of water.  . Multiple Minerals-Vitamins (CALCIUM & VIT D3 BONE HEALTH PO) Take 1 capsule by mouth daily.  Marland Kitchen omeprazole (PRILOSEC) 20 MG capsule Take 20 mg by mouth every morning.  . [DISCONTINUED] cefdinir (OMNICEF) 300 MG capsule Take 1 capsule (300 mg total) by mouth 2 (two) times daily.  . [DISCONTINUED] predniSONE (STERAPRED UNI-PAK 48 TAB) 10 MG (48) TBPK tablet Sterapred DS 12 day taper, take as directed  . [EXPIRED] Hylan SOSY 48 mg   . [EXPIRED] Hylan SOSY 48 mg    No facility-administered encounter medications on file as of 05/01/2019.

## 2019-05-17 ENCOUNTER — Encounter: Payer: Self-pay | Admitting: Family Medicine

## 2019-05-19 ENCOUNTER — Other Ambulatory Visit: Payer: Self-pay

## 2019-05-19 DIAGNOSIS — D0512 Intraductal carcinoma in situ of left breast: Secondary | ICD-10-CM

## 2019-05-22 ENCOUNTER — Encounter: Payer: Self-pay | Admitting: Oncology

## 2019-05-22 ENCOUNTER — Inpatient Hospital Stay: Payer: Medicare PPO

## 2019-05-22 ENCOUNTER — Other Ambulatory Visit: Payer: Self-pay

## 2019-05-22 ENCOUNTER — Inpatient Hospital Stay: Payer: Medicare PPO | Attending: Oncology | Admitting: Oncology

## 2019-05-22 ENCOUNTER — Encounter: Payer: Self-pay | Admitting: Family Medicine

## 2019-05-22 ENCOUNTER — Ambulatory Visit: Payer: Medicare PPO | Admitting: Family Medicine

## 2019-05-22 VITALS — BP 171/94 | HR 71 | Temp 96.6°F | Resp 16 | Wt 146.3 lb

## 2019-05-22 VITALS — BP 130/60 | HR 82 | Temp 98.2°F | Ht 62.0 in | Wt 147.0 lb

## 2019-05-22 DIAGNOSIS — M810 Age-related osteoporosis without current pathological fracture: Secondary | ICD-10-CM | POA: Insufficient documentation

## 2019-05-22 DIAGNOSIS — D0512 Intraductal carcinoma in situ of left breast: Secondary | ICD-10-CM | POA: Diagnosis not present

## 2019-05-22 DIAGNOSIS — D649 Anemia, unspecified: Secondary | ICD-10-CM | POA: Insufficient documentation

## 2019-05-22 DIAGNOSIS — Z79811 Long term (current) use of aromatase inhibitors: Secondary | ICD-10-CM | POA: Insufficient documentation

## 2019-05-22 DIAGNOSIS — M170 Bilateral primary osteoarthritis of knee: Secondary | ICD-10-CM | POA: Diagnosis not present

## 2019-05-22 LAB — CBC WITH DIFFERENTIAL/PLATELET
Abs Immature Granulocytes: 0.02 10*3/uL (ref 0.00–0.07)
Basophils Absolute: 0.1 10*3/uL (ref 0.0–0.1)
Basophils Relative: 1 %
Eosinophils Absolute: 0.3 10*3/uL (ref 0.0–0.5)
Eosinophils Relative: 4 %
HCT: 35 % — ABNORMAL LOW (ref 36.0–46.0)
Hemoglobin: 11.3 g/dL — ABNORMAL LOW (ref 12.0–15.0)
Immature Granulocytes: 0 %
Lymphocytes Relative: 23 %
Lymphs Abs: 1.9 10*3/uL (ref 0.7–4.0)
MCH: 29.9 pg (ref 26.0–34.0)
MCHC: 32.3 g/dL (ref 30.0–36.0)
MCV: 92.6 fL (ref 80.0–100.0)
Monocytes Absolute: 0.8 10*3/uL (ref 0.1–1.0)
Monocytes Relative: 9 %
Neutro Abs: 5.2 10*3/uL (ref 1.7–7.7)
Neutrophils Relative %: 63 %
Platelets: 258 10*3/uL (ref 150–400)
RBC: 3.78 MIL/uL — ABNORMAL LOW (ref 3.87–5.11)
RDW: 14 % (ref 11.5–15.5)
WBC: 8.2 10*3/uL (ref 4.0–10.5)
nRBC: 0 % (ref 0.0–0.2)

## 2019-05-22 LAB — COMPREHENSIVE METABOLIC PANEL
ALT: 27 U/L (ref 0–44)
AST: 29 U/L (ref 15–41)
Albumin: 4.6 g/dL (ref 3.5–5.0)
Alkaline Phosphatase: 103 U/L (ref 38–126)
Anion gap: 7 (ref 5–15)
BUN: 13 mg/dL (ref 8–23)
CO2: 26 mmol/L (ref 22–32)
Calcium: 9.6 mg/dL (ref 8.9–10.3)
Chloride: 103 mmol/L (ref 98–111)
Creatinine, Ser: 0.69 mg/dL (ref 0.44–1.00)
GFR calc Af Amer: >60 mL/min (ref >60)
GFR calc non Af Amer: >60 mL/min (ref >60)
Glucose, Bld: 111 mg/dL — ABNORMAL HIGH (ref 70–99)
Potassium: 3.5 mmol/L (ref 3.5–5.1)
Sodium: 136 mmol/L (ref 135–145)
Total Bilirubin: 0.6 mg/dL (ref 0.3–1.2)
Total Protein: 8 g/dL (ref 6.5–8.1)

## 2019-05-22 MED ORDER — DICLOFENAC SODIUM 1 % EX GEL: 4.0000 g | Freq: Four times a day (QID) | CUTANEOUS | 5 refills | Status: DC

## 2019-05-22 MED ORDER — LETROZOLE 2.5 MG PO TABS: 2.5000 mg | ORAL_TABLET | Freq: Every day | ORAL | 1 refills | Status: DC

## 2019-05-22 NOTE — Progress Notes (Signed)
[place] [facility] [facility] follow up visit Patient Care Team: Pleas Koch, NP as PCP - General (Internal Medicine)  REASON FOR VISIT Follow up for treatment of DCIS  HISTORY OF PRESENTING ILLNESS: Mallory Leblanc 69 y.o. 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 Patient had screening mammogram done on 08/21/2016 with [facility] which revealed grouped calcification that approximately 1 cm in the upper outer quadrant of the left breast 9 cm from nipple which are indicated to terminate a prominent left axillary lymph node is present and is unchanged when compared to the ultrasound of 13 and 2711 studies no other dermatitis is seen in the left breast there are no suspicious masses malignant calcifications site of architecture distortion or concerning asymmetries in the right breast. Patient had diagnostic mammogram unilateral left down on September 09 2006, followed by rest left upper outer quadrant stereotactic biopsy. Pathology showed DCIS intermediate grade, calcifications associated with DCIS. DCIS is present in 4 out of 6 blocks with the largest focus measuring 6 mm. ER more than 90% positive. PR 50-90% positive.  Post lumpectomy pathology showed: DCIS status post lumpectomy. Superior margin is negative but close. 2 mm margin is recommended as it is associated with reduced risk of ipsilateral tumor recurrence. Case was discussed at breast tumor conference and consensus was patient to undergo radiation followed by adjuvant endocrine therapy for 5 years.   She is postmenopausal. Started on Letrozole 2.'5mg'$  daily since April 2019.   # 08/27/2017 Diagnostic Mammogram showed benign findings. Will need diagnostic mammogram in 1 year.  INTERVAL HISTORY Patient with oncology history listed above reviewed by me today presents for follow up for follow up for DCIS.  Has been taking  letrozole 2.5 mg daily, since April 2019.  Manageable chronic intermittent hot flash. Today she reports doing well and has no new complaints.  She denies any concerns of her breast. She gets annual mammogram via gynecology.   Review of Systems  Constitutional: Negative for appetite change, chills, diaphoresis, fatigue and fever.  HENT:   Negative for hearing loss, lump/mass, nosebleeds and voice change.   Eyes: Negative for eye problems.  Respiratory: Negative for chest tightness, cough, hemoptysis and shortness of breath.   Cardiovascular: Negative for chest pain and leg swelling.  Gastrointestinal: Negative for abdominal distention, abdominal pain, blood in stool, constipation and diarrhea.  Endocrine: Positive for hot flashes.  Genitourinary: Negative for bladder incontinence, difficulty urinating, frequency and hematuria.   Musculoskeletal: Negative for arthralgias, back pain, flank pain and gait problem.  Skin: Negative for itching and rash.  Neurological: Negative for dizziness, extremity weakness, gait problem, headaches and numbness.  Hematological: Negative for adenopathy. Does not bruise/bleed easily.  Psychiatric/Behavioral: Negative for confusion, decreased concentration and sleep disturbance. The patient is not nervous/anxious.     MEDICAL HISTORY: Past Medical History:  Diagnosis Date  . Anemia   . Arthritis    LEFT KNEE  . Breast cancer ([place]) 2018   Left Breast Cancer- DCIS  . Genital warts   . GERD (gastroesophageal reflux disease)    OCC  . Hyperlipidemia   . Hypothyroidism   . Personal history of radiation therapy 2018   F/U left breast cancer    SURGICAL HISTORY: Past Surgical History:  Procedure Laterality Date  . APPENDECTOMY    . BREAST BIOPSY Left 09/21/2016   DCIS  . BREAST LUMPECTOMY Left 2018   DCIS  .  cyst     on the left ovary early 80's  . ETHMOIDECTOMY Bilateral 01/17/2019   Procedure: ETHMOIDECTOMY;  Surgeon: Clyde Canterbury, MD;   Location: [facility];  Service: ENT;  Laterality: Bilateral;  . FRONTAL SINUS EXPLORATION Bilateral 01/17/2019   Procedure: FRONTAL SINUS EXPLORATION;  Surgeon: Clyde Canterbury, MD;  Location: [place];  Service: ENT;  Laterality: Bilateral;  . IMAGE GUIDED SINUS SURGERY N/A 01/17/2019   Procedure: IMAGE GUIDED SINUS SURGERY;  Surgeon: Clyde Canterbury, MD;  Location: [place];  Service: ENT;  Laterality: N/A;  NEED STRYKER DISK put disk on or charge nurse desk 10-30  kp gave 2nd disk to Susan 11-4 kp  . MAXILLARY ANTROSTOMY Bilateral 01/17/2019   Procedure: MAXILLARY ANTROSTOMY;  Surgeon: Clyde Canterbury, MD;  Location: [facility];  Service: ENT;  Laterality: Bilateral;  . PARTIAL MASTECTOMY WITH NEEDLE LOCALIZATION Left 10/16/2016   Procedure: PARTIAL MASTECTOMY WITH NEEDLE LOCALIZATION;  Surgeon: Leonie Green, MD;  Location: [facility];  Service: General;  Laterality: Left;  . SENTINEL NODE BIOPSY Left 10/16/2016   Procedure: SENTINEL NODE BIOPSY;  Surgeon: Leonie Green, MD;  Location: [facility];  Service: General;  Laterality: Left;  . SPHENOIDECTOMY Left 01/17/2019   Procedure: SPHENOIDOTOMY;  Surgeon: Clyde Canterbury, MD;  Location: [place];  Service: ENT;  Laterality: Left;  . WRIST FRACTURE SURGERY Right     SOCIAL HISTORY: Social History   Socioeconomic History  . Marital status: Married    Spouse name: Not on file  . Number of children: Not on file  . Years of education: Not on file  . Highest education level: Not on file  Occupational History  . Not on file  Tobacco Use  . Smoking status: Never Smoker  . Smokeless tobacco: Never Used  Substance and Sexual Activity  . Alcohol use: Yes    Alcohol/week: 6.0 standard drinks    Types: 6 Cans of beer per week    Comment: BEER OCC  . Drug use: No  . Sexual activity: Not on file  Other Topics Concern  . Not on file  Social History Narrative   Married.   1 child.     Works as a Optometrist.   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   Social Determinants of Health   Financial Resource Strain:   . Difficulty of Paying Living Expenses:   Food Insecurity:   . Worried About Charity fundraiser in the Last Year:   . Arboriculturist in the Last Year:   Transportation Needs:   . Film/video editor (Medical):   Marland Kitchen Lack of Transportation (Non-Medical):   Physical Activity:   . Days of Exercise per Week:   . Minutes of Exercise per Session:   Stress:   . Feeling of Stress :   Social Connections:   . Frequency of Communication with Friends and Family:   . Frequency of Social Gatherings with Friends and Family:   . Attends Religious Services:   . Active Member of Clubs or Organizations:   . Attends Archivist Meetings:   Marland Kitchen Marital Status:   Intimate Partner Violence:   . Fear of Current or Ex-Partner:   . Emotionally Abused:   Marland Kitchen Physically Abused:   . Sexually Abused:     FAMILY HISTORY Family History  Problem Relation Age of Onset  . Heart disease Father     ALLERGIES:  is allergic  to sulfa antibiotics.  MEDICATIONS:  Current Outpatient Medications  Medication Sig Dispense Refill  . acetaminophen (TYLENOL) 500 MG tablet Take 1,000 mg by mouth every 8 (eight) hours as needed for mild pain or moderate pain.    Marland Kitchen atorvastatin (LIPITOR) 10 MG tablet Take 1 tablet (10 mg total) by mouth daily. NEEDS PHYSICAL EXAM 90 tablet 0  . Cholecalciferol (D3 VITAMIN PO) Take by mouth.    . fluticasone (FLONASE) 50 MCG/ACT nasal spray Place 1 spray into both nostrils 2 (two) times daily. 16 g 0  . HYDROcodone-acetaminophen (NORCO/VICODIN) 5-325 MG tablet Take 1-2 tablets by mouth every 6 (six) hours as needed for moderate pain. 30 tablet 0  . letrozole (FEMARA) 2.5 MG tablet Take 1 tablet (2.5 mg total) by mouth daily. 90 tablet 1  . levocetirizine (XYZAL) 5 MG tablet TAKE 1 TABLET BY MOUTH ONCE EVERY  EVENING FOR ALLERGIES 90 tablet 2  . levothyroxine (SYNTHROID) 100 MCG tablet Take 1 tablet by mouth every morning on an empty stomach with a full glass of water. 90 tablet 1  . Multiple Minerals-Vitamins (CALCIUM & VIT D3 BONE HEALTH PO) Take 1 capsule by mouth daily.    Marland Kitchen omeprazole (PRILOSEC) 20 MG capsule Take 20 mg by mouth every morning.    . diclofenac Sodium (VOLTAREN) 1 % GEL Apply 4 g topically 4 (four) times daily. 500 g 5   No current facility-administered medications for this visit.    PHYSICAL EXAMINATION:  ECOG PERFORMANCE STATUS: 0 - Asymptomatic   Vitals:   05/22/19 1030  BP: (!) 171/94  Pulse: 71  Resp: 16  Temp: (!) 96.6 F (35.9 C)    Filed Weights   05/22/19 1030  Weight: 146 lb 4.8 oz (66.4 kg)     Physical Exam  Constitutional: She is oriented to person, place, and time. No distress.  HENT:  Head: Normocephalic and atraumatic.  Nose: Nose normal.  Mouth/Throat: Oropharynx is clear and moist. No oropharyngeal exudate.  Eyes: Pupils are equal, round, and reactive to light. EOM are normal. No scleral icterus.  Cardiovascular: Normal rate and regular rhythm.  No murmur heard. Pulmonary/Chest: Effort normal. No respiratory distress. She has no rales. She exhibits no tenderness.  Abdominal: Soft. She exhibits no distension. There is no abdominal tenderness.  Musculoskeletal:        General: No edema. Normal range of motion.     Cervical back: Normal range of motion and neck supple.  Neurological: She is alert and oriented to person, place, and time. No cranial nerve deficit. She exhibits normal muscle tone. Coordination normal.  Skin: Skin is warm and dry. She is not diaphoretic. No erythema.  Psychiatric: Affect normal.  Breast exam was performed in seated and lying down position. Patient is status post lumpectomy left breast with a well-healed surgical scar, fibrotic changes. No evidence of any palpable masses. No evidence of axillary adenopathy. She  has focal radiation induced hyperpigmentation and also breast edema.             RADIOGRAPHIC STUDIES: I have personally reviewed the radiological images as listed and agree with the findings in the report No results found.   ASSESSMENT/PLAN Cancer Staging Ductal carcinoma in situ (DCIS) of left breast Staging form: Breast, AJCC 8th Edition - Clinical stage from 09/21/2016: Stage 0 (cTis (DCIS), cN0, cM0, ER: Positive, PR: Positive, HER2: Negative) - Signed by Earlie Server, MD on 10/22/2016 - Pathologic stage from 10/16/2016: Stage 0 (pTis (DCIS), pN0(sn), cM0, ER: Positive,  PR: Positive, HER2: Negative) - Signed by Earlie Server, MD on 10/22/2016  .69 yo postmenapausal female with history of DCIS s/p lumpectomy presents to follow up on management of DCIS.   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 Osteoporosis without current pathological fracture, unspecified osteoporosis type   3. Aromatase inhibitor use   4. Normocytic anemia    # DCIS,Continue letrozole 2.5 mg daily to complete total of 5 years.  She now has been  on letrozole for 2 years.  Overall tolerates well.  S Recommend patient to continue current regimen. She is due for diagnostic bilateral mammogram in June 2021.  She gets mammogram ordered through her GYN clinic.  # Anemia, hemoglobin 11.3, stable at baseline.  Check B12 and folate level at the next visit.  # Osteoporosis, recommend patient to continue vitamin D and calcium supplementation.   She is aware about pathological fracture risk. Discussed with her about rationale and potential side effects of starting bisphosphonate for osteoporosis in the context of chronic aromatase inhibitor treatments.  She declined.  All questions were answered. The patient knows to call the clinic with any problems, questions or concerns. Follow up in  6 months.   Earlie Server, MD, PhD Hematology Oncology [facility] at [facility] Pager- [phone] 05/22/2019

## 2019-05-22 NOTE — Progress Notes (Signed)
Spyros Winch T. Areana Kosanke, MD Primary Care and Sports Medicine [facility] at [facility] [place], [address] Phone: [phone]  FAX: [place] - 69 y.o. female  MRN [id number]  Date of Birth: May 08, 1950  Visit Date: 05/22/2019  PCP: Pleas Koch, NP  Referred by: Pleas Koch, NP  Chief Complaint  Patient presents with  . Follow-up    Bilateral Knee Pain    This visit occurred during the SARS-CoV-2 public health emergency.  Safety protocols were in place, including screening questions prior to the visit, additional usage of staff PPE, and extensive cleaning of exam room while observing appropriate contact time as indicated for disinfecting solutions.   Subjective:   Mallory Leblanc is a 69 y.o. very pleasant female patient with Body mass index is 26.89 kg/m. who presents with the following:  She is a pleasant lady, and I saw her on May 01, 2019.  At that point we did do bilateral Synvisc 1 injections.  Previously she had had an excellent response to this.  At approximately the 2-week mark, she did call and tell me that her knees were bothering her.  After a few days to today's visit her knees are feeling quite a bit better.  She is having only minimal effusion on the right, no giving way and no other mechanical symptoms.  Review of Systems is noted in the HPI, as appropriate   Objective:   BP 130/60   Pulse 82   Temp 98.2 F (36.8 C) (Temporal)   Ht 5\' 2"  (1.575 m)   Wt 147 lb (66.7 kg)   SpO2 97%   BMI 26.89 kg/m   GEN: No acute distress; alert,appropriate. PULM: Breathing comfortably in no respiratory distress PSYCH: Normally interactive.   Bilateral knee exam: Full extension bilaterally, flexion to 125 bilaterally.  There is a mild effusion in the right knee but none in the left.  ACL, PCL, MCL, and LCL are all intact.  She does have some modest medial joint line tenderness.  All ligamentous  structures are intact as well as no pain with provocative meniscal maneuvers.  No pain with deep flexion.  Radiology: No results found.  Assessment and Plan:     ICD-10-CM   1. Primary osteoarthritis of knees, bilateral  M17.0    She really has done quite well post viscosupplementation injection, but she does have some delay of onset of symptom relief.  I reassured her, and I suspect that she will do well.  Trial of topical Voltaren.  Follow-up: No follow-ups on file.  Meds ordered this encounter  Medications  . diclofenac Sodium (VOLTAREN) 1 % GEL    Sig: Apply 4 g topically 4 (four) times daily.    Dispense:  500 g    Refill:  5   There are no discontinued medications. No orders of the defined types were placed in this encounter.   Signed,  Maud Deed. Imo Cumbie, MD   Outpatient Encounter Medications as of 05/22/2019  Medication Sig  . acetaminophen (TYLENOL) 500 MG tablet Take 1,000 mg by mouth every 8 (eight) hours as needed for mild pain or moderate pain.  Marland Kitchen atorvastatin (LIPITOR) 10 MG tablet Take 1 tablet (10 mg total) by mouth daily. NEEDS PHYSICAL EXAM  . Cholecalciferol (D3 VITAMIN PO) Take by mouth.  . fluticasone (FLONASE) 50 MCG/ACT nasal spray Place 1 spray into both nostrils 2 (two) times daily.  Marland Kitchen HYDROcodone-acetaminophen (NORCO/VICODIN) 5-325  MG tablet Take 1-2 tablets by mouth every 6 (six) hours as needed for moderate pain.  Marland Kitchen letrozole (FEMARA) 2.5 MG tablet Take 1 tablet (2.5 mg total) by mouth daily.  Marland Kitchen levocetirizine (XYZAL) 5 MG tablet TAKE 1 TABLET BY MOUTH ONCE EVERY EVENING FOR ALLERGIES  . levothyroxine (SYNTHROID) 100 MCG tablet Take 1 tablet by mouth every morning on an empty stomach with a full glass of water.  . Multiple Minerals-Vitamins (CALCIUM & VIT D3 BONE HEALTH PO) Take 1 capsule by mouth daily.  Marland Kitchen omeprazole (PRILOSEC) 20 MG capsule Take 20 mg by mouth every morning.  . diclofenac Sodium (VOLTAREN) 1 % GEL Apply 4 g topically 4 (four)  times daily.   No facility-administered encounter medications on file as of 05/22/2019.

## 2019-05-22 NOTE — Progress Notes (Signed)
Patient does not offer any problems today.  

## 2019-05-22 NOTE — Patient Instructions (Signed)
Alleve 2 tabs by mouth two times a day over the counter: This is equal to a prescripton strength dose (GENERIC CHEAPER EQUIVALENT IS NAPROXEN SODIUM)   Diclofenac 1% gel.  Can use up to 4 times a day.

## 2019-06-05 ENCOUNTER — Encounter: Payer: Medicare Other | Admitting: Primary Care

## 2019-06-14 ENCOUNTER — Other Ambulatory Visit: Payer: Self-pay | Admitting: Primary Care

## 2019-06-14 DIAGNOSIS — E785 Hyperlipidemia, unspecified: Secondary | ICD-10-CM

## 2019-06-27 ENCOUNTER — Other Ambulatory Visit: Payer: Self-pay | Admitting: Primary Care

## 2019-06-27 ENCOUNTER — Other Ambulatory Visit: Payer: Self-pay

## 2019-06-27 ENCOUNTER — Ambulatory Visit (INDEPENDENT_AMBULATORY_CARE_PROVIDER_SITE_OTHER): Payer: Medicare PPO | Admitting: Primary Care

## 2019-06-27 ENCOUNTER — Encounter: Payer: Self-pay | Admitting: Primary Care

## 2019-06-27 VITALS — BP 126/86 | HR 66 | Temp 96.4°F | Ht 62.0 in | Wt 147.5 lb

## 2019-06-27 DIAGNOSIS — M810 Age-related osteoporosis without current pathological fracture: Secondary | ICD-10-CM | POA: Diagnosis not present

## 2019-06-27 DIAGNOSIS — Z0000 Encounter for general adult medical examination without abnormal findings: Secondary | ICD-10-CM

## 2019-06-27 DIAGNOSIS — E039 Hypothyroidism, unspecified: Secondary | ICD-10-CM

## 2019-06-27 DIAGNOSIS — R7303 Prediabetes: Secondary | ICD-10-CM | POA: Diagnosis not present

## 2019-06-27 DIAGNOSIS — K219 Gastro-esophageal reflux disease without esophagitis: Secondary | ICD-10-CM | POA: Diagnosis not present

## 2019-06-27 DIAGNOSIS — J329 Chronic sinusitis, unspecified: Secondary | ICD-10-CM

## 2019-06-27 DIAGNOSIS — E785 Hyperlipidemia, unspecified: Secondary | ICD-10-CM

## 2019-06-27 DIAGNOSIS — Z0001 Encounter for general adult medical examination with abnormal findings: Secondary | ICD-10-CM | POA: Insufficient documentation

## 2019-06-27 DIAGNOSIS — D0512 Intraductal carcinoma in situ of left breast: Secondary | ICD-10-CM

## 2019-06-27 LAB — LIPID PANEL
Cholesterol: 176 mg/dL (ref 0–200)
HDL: 61 mg/dL (ref >39.00)
NonHDL: 114.9
Total CHOL/HDL Ratio: 3
Triglycerides: 238 mg/dL — ABNORMAL HIGH (ref 0.0–149.0)
VLDL: 47.6 mg/dL — ABNORMAL HIGH (ref 0.0–40.0)

## 2019-06-27 LAB — VITAMIN D 25 HYDROXY (VIT D DEFICIENCY, FRACTURES): VITD: 26.65 ng/mL — ABNORMAL LOW (ref 30.00–100.00)

## 2019-06-27 LAB — HEMOGLOBIN A1C: Hgb A1c MFr Bld: 6.1 % (ref 4.6–6.5)

## 2019-06-27 LAB — TSH: TSH: 0.19 u[IU]/mL — ABNORMAL LOW (ref 0.35–4.50)

## 2019-06-27 LAB — LDL CHOLESTEROL, DIRECT: Direct LDL: 84 mg/dL

## 2019-06-27 MED ORDER — LEVOTHYROXINE SODIUM 88 MCG PO TABS: ORAL_TABLET | ORAL | 0 refills | Status: DC

## 2019-06-27 NOTE — Progress Notes (Addendum)
Subjective:    Patient ID: Mallory Leblanc, female    DOB: 01-06-1951, 69 y.o.   MRN: [id number]  HPI  This visit occurred during the SARS-CoV-2 public health emergency.  Safety protocols were in place, including screening questions prior to the visit, additional usage of staff PPE, and extensive cleaning of exam room while observing appropriate contact time as indicated for disinfecting solutions.   Mallory Leblanc is a 69 year old female who presents today for complete physical.  Immunizations: -Influenza: Completed last season  -Shingles: Completed Shingrix -Pneumonia: Never completed  -Covid 19: Completed first dose in April 2021  Diet: She endorses a healthy diet.  Exercise: She is walking on her treadmill, using weights at times.   Eye exam: No recent exam Dental exam: Completes semi-annually   Mammogram: Completed in June 2020, following with oncology.  Dexa: Completed in January 2019, osteoporosis. Follows in oncology. Colonoscopy: UTD per patient.  Hep C Screen: Negative  BP Readings from Last 3 Encounters:  06/27/19 126/86  05/22/19 130/60  05/22/19 (!) 171/94     Review of Systems  Constitutional: Negative for unexpected weight change.  HENT: Negative for rhinorrhea.   Respiratory: Negative for cough and shortness of breath.   Cardiovascular: Negative for chest pain.  Gastrointestinal: Negative for constipation and diarrhea.  Genitourinary: Negative for difficulty urinating.  Musculoskeletal: Positive for arthralgias.  Skin: Negative for rash.  Allergic/Immunologic: Positive for environmental allergies.  Neurological: Negative for dizziness, numbness and headaches.  Psychiatric/Behavioral: The patient is not nervous/anxious.        Past Medical History:  Diagnosis Date  . Anemia   . Arthritis    LEFT KNEE  . 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 Genital warts   . GERD (gastroesophageal reflux disease)    OCC  . Hyperlipidemia   .  Hypothyroidism   . Personal history of radiation therapy 2018   F/U left breast cancer     Social History   Socioeconomic History  . Marital status: Married    Spouse name: Not on file  . Number of children: Not on file  . Years of education: Not on file  . Highest education level: Not on file  Occupational History  . Not on file  Tobacco Use  . Smoking status: Never Smoker  . Smokeless tobacco: Never Used  Substance and Sexual Activity  . Alcohol use: Yes    Alcohol/week: 6.0 standard drinks    Types: 6 Cans of beer per week    Comment: BEER OCC  . Drug use: No  . Sexual activity: Not on file  Other Topics Concern  . Not on file  Social History Narrative   Married.   1 child.    Works as a Optometrist.   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   Social Determinants of Health   Financial Resource Strain:   . Difficulty of Paying Living Expenses:   Food Insecurity:   . Worried About Charity fundraiser in the Last Year:   . Arboriculturist in the Last Year:   Transportation Needs:   . Film/video editor (Medical):   Marland Kitchen Lack of Transportation (Non-Medical):   Physical Activity:   . Days of Exercise per Week:   . Minutes of Exercise per Session:   Stress:   . Feeling of Stress :   Social Connections:   . Frequency of Communication with Friends  and Family:   . Frequency of Social Gatherings with Friends and Family:   . Attends Religious Services:   . Active Member of Clubs or Organizations:   . Attends Archivist Meetings:   Marland Kitchen Marital Status:   Intimate Partner Violence:   . Fear of Current or Ex-Partner:   . Emotionally Abused:   Marland Kitchen Physically Abused:   . Sexually Abused:     Past Surgical History:  Procedure Laterality Date  . APPENDECTOMY    . BREAST BIOPSY Left 09/21/2016   DCIS  . BREAST LUMPECTOMY Left 2018   DCIS  . cyst     on the left ovary early 80's  . ETHMOIDECTOMY Bilateral  01/17/2019   Procedure: ETHMOIDECTOMY;  Surgeon: Clyde Canterbury, MD;  Location: [facility];  Service: ENT;  Laterality: Bilateral;  . FRONTAL SINUS EXPLORATION Bilateral 01/17/2019   Procedure: FRONTAL SINUS EXPLORATION;  Surgeon: Clyde Canterbury, MD;  Location: [facility];  Service: ENT;  Laterality: Bilateral;  . IMAGE GUIDED SINUS SURGERY N/A 01/17/2019   Procedure: IMAGE GUIDED SINUS SURGERY;  Surgeon: Clyde Canterbury, MD;  Location: [facility];  Service: ENT;  Laterality: N/A;  NEED STRYKER DISK put disk on or charge nurse desk 10-30  kp gave 2nd disk to Susan 11-4 kp  . MAXILLARY ANTROSTOMY Bilateral 01/17/2019   Procedure: MAXILLARY ANTROSTOMY;  Surgeon: Clyde Canterbury, MD;  Location: [facility];  Service: ENT;  Laterality: Bilateral;  . PARTIAL MASTECTOMY WITH NEEDLE LOCALIZATION Left 10/16/2016   Procedure: PARTIAL MASTECTOMY WITH NEEDLE LOCALIZATION;  Surgeon: Leonie Green, MD;  Location: [facility];  Service: General;  Laterality: Left;  . SENTINEL NODE BIOPSY Left 10/16/2016   Procedure: SENTINEL NODE BIOPSY;  Surgeon: Leonie Green, MD;  Location: [facility];  Service: General;  Laterality: Left;  . SPHENOIDECTOMY Left 01/17/2019   Procedure: SPHENOIDOTOMY;  Surgeon: Clyde Canterbury, MD;  Location: [facility];  Service: ENT;  Laterality: Left;  . WRIST FRACTURE SURGERY Right     Family History  Problem Relation Age of Onset  . Heart disease Father     Allergies  Allergen Reactions  . Sulfa Antibiotics Rash    Current Outpatient Medications on File Prior to Visit  Medication Sig Dispense Refill  . acetaminophen (TYLENOL) 500 MG tablet Take 1,000 mg by mouth every 8 (eight) hours as needed for mild pain or moderate pain.    Marland Kitchen atorvastatin (LIPITOR) 10 MG tablet TAKE 1 TABLET BY MOUTH ONCE A DAY FOR CHOLESTEROL 90 tablet 0  . Cholecalciferol (D3 VITAMIN PO) Take by mouth.    . diclofenac Sodium (VOLTAREN) 1 % GEL Apply 4 g  topically 4 (four) times daily. 500 g 5  . fluticasone (FLONASE) 50 MCG/ACT nasal spray Place 1 spray into both nostrils 2 (two) times daily. 16 g 0  . letrozole (FEMARA) 2.5 MG tablet Take 1 tablet (2.5 mg total) by mouth daily. 90 tablet 1  . levocetirizine (XYZAL) 5 MG tablet TAKE 1 TABLET BY MOUTH ONCE EVERY EVENING FOR ALLERGIES 90 tablet 2  . levothyroxine (SYNTHROID) 100 MCG tablet Take 1 tablet by mouth every morning on an empty stomach with a full glass of water. 90 tablet 1  . Multiple Minerals-Vitamins (CALCIUM & VIT D3 BONE HEALTH PO) Take 1 capsule by mouth daily.    Marland Kitchen omeprazole (PRILOSEC) 20 MG capsule Take 20 mg by mouth every morning.     No current facility-administered medications on file prior to visit.  BP 126/86   Pulse 66   Temp (!) 96.4 F (35.8 C) (Temporal)   Ht 5\' 2"  (1.575 m)   Wt 147 lb 8 oz (66.9 kg)   SpO2 99%   BMI 26.98 kg/m    Objective:   Physical Exam  Constitutional: She is oriented to person, place, and time. She appears well-nourished.  HENT:  Right Ear: Tympanic membrane and ear canal normal.  Left Ear: Tympanic membrane and ear canal normal.  Mouth/Throat: Oropharynx is clear and moist.  Eyes: Pupils are equal, round, and reactive to light. EOM are normal.  Cardiovascular: Normal rate and regular rhythm.  Respiratory: Effort normal and breath sounds normal.  GI: Soft. Bowel sounds are normal. There is no abdominal tenderness.  Musculoskeletal:        General: Normal range of motion.     Cervical back: Neck supple.  Neurological: She is alert and oriented to person, place, and time. No cranial nerve deficit.  Reflex Scores:      Patellar reflexes are 2+ on the right side and 2+ on the left side. Skin: Skin is warm and dry.  Raised, soft, immobile, superficial mass to right radial posterior wrist.  Psychiatric: She has a normal mood and affect.           Assessment & Plan:

## 2019-06-27 NOTE — Assessment & Plan Note (Signed)
Chronic PPI use, discussed to try weaning and using Tums if needed. She will update.

## 2019-06-27 NOTE — Assessment & Plan Note (Signed)
Prevnar due, she will schedule a nurse visit. Other immunizations UTD or pending. Bone density scan overdue, she will have this done later this Summer. Mammogram UTD. Colon cancer screening likely due, she will notify for sure. Encouraged regular exercise and healthy diet.  Exam today unremarkable. Labs pending.

## 2019-06-27 NOTE — Assessment & Plan Note (Signed)
Following with oncology, mammogram due this Summer. Continue letrozole.   Patient will update regarding colonoscopy due date.

## 2019-06-27 NOTE — Assessment & Plan Note (Signed)
Significant improvement since nasal sinus surgery. Recently released from ENT.

## 2019-06-27 NOTE — Assessment & Plan Note (Signed)
She is taking levothyroxine every morning with water only, no food or other medications for 30 min.  Repeat TSH pending.

## 2019-06-27 NOTE — Assessment & Plan Note (Signed)
Bone density scan overdue, she will complete later this year. Discussed to start calcium, continue vitamin D, encouraged weight bearing exercise.

## 2019-06-27 NOTE — Patient Instructions (Addendum)
Stop by the lab prior to leaving today. I will notify you of your results once received.   Please notify me if we need to order the colonoscopy.   You need 1200 mg of calcium daily along with vitamin D.  Continue exercising. You should be getting 150 minutes of moderate intensity exercise weekly.  Continue to work on a healthy diet.   Schedule a nurse visit for the first pneumonia vaccine one month (or later) after you finish your Covid-19 vaccine series.   It was a pleasure to see you today!   Preventive Care 67 Years and Older, Female Preventive care refers to lifestyle choices and visits with your health care provider that can promote health and wellness. This includes:  A yearly physical exam. This is also called an annual well check.  Regular dental and eye exams.  Immunizations.  Screening for certain conditions.  Healthy lifestyle choices, such as diet and exercise. What can I expect for my preventive care visit? Physical exam Your health care provider will check:  Height and weight. These may be used to calculate body mass index (BMI), which is a measurement that tells if you are at a healthy weight.  Heart rate and blood pressure.  Your skin for abnormal spots. Counseling Your health care provider may ask you questions about:  Alcohol, tobacco, and drug use.  Emotional well-being.  Home and relationship well-being.  Sexual activity.  Eating habits.  History of falls.  Memory and ability to understand (cognition).  Work and work Statistician.  Pregnancy and menstrual history. What immunizations do I need?  Influenza (flu) vaccine  This is recommended every year. Tetanus, diphtheria, and pertussis (Tdap) vaccine  You may need a Td booster every 10 years. Varicella (chickenpox) vaccine  You may need this vaccine if you have not already been vaccinated. Zoster (shingles) vaccine  You may need this after age 32. Pneumococcal conjugate (PCV13)  vaccine  One dose is recommended after age 82. Pneumococcal polysaccharide (PPSV23) vaccine  One dose is recommended after age 5. Measles, mumps, and rubella (MMR) vaccine  You may need at least one dose of MMR if you were born in 1957 or later. You may also need a second dose. Meningococcal conjugate (MenACWY) vaccine  You may need this if you have certain conditions. Hepatitis A vaccine  You may need this if you have certain conditions or if you travel or work in places where you may be exposed to hepatitis A. Hepatitis B vaccine  You may need this if you have certain conditions or if you travel or work in places where you may be exposed to hepatitis B. Haemophilus influenzae type b (Hib) vaccine  You may need this if you have certain conditions. You may receive vaccines as individual doses or as more than one vaccine together in one shot (combination vaccines). Talk with your health care provider about the risks and benefits of combination vaccines. What tests do I need? Blood tests  Lipid and cholesterol levels. These may be checked every 5 years, or more frequently depending on your overall health.  Hepatitis C test.  Hepatitis B test. Screening  Lung cancer screening. You may have this screening every year starting at age 16 if you have a 30-pack-year history of smoking and currently smoke or have quit within the past 15 years.  Colorectal cancer screening. All adults should have this screening starting at age 49 and continuing until age 56. Your health care provider may recommend screening at  age 95 if you are at increased risk. You will have tests every 1-10 years, depending on your results and the type of screening test.  Diabetes screening. This is done by checking your blood sugar (glucose) after you have not eaten for a while (fasting). You may have this done every 1-3 years.  Mammogram. This may be done every 1-2 years. Talk with your health care provider about how  often you should have regular mammograms.  BRCA-related cancer screening. This may be done if you have a family history of breast, ovarian, tubal, or peritoneal cancers. Other tests  Sexually transmitted disease (STD) testing.  Bone density scan. This is done to screen for osteoporosis. You may have this done starting at age 71. Follow these instructions at home: Eating and drinking  Eat a diet that includes fresh fruits and vegetables, whole grains, lean protein, and low-fat dairy products. Limit your intake of foods with high amounts of sugar, saturated fats, and salt.  Take vitamin and mineral supplements as recommended by your health care provider.  Do not drink alcohol if your health care provider tells you not to drink.  If you drink alcohol: ? Limit how much you have to 0-1 drink a day. ? Be aware of how much alcohol is in your drink. In the [place] one drink equals one 12 oz bottle of beer (355 mL), one 5 oz glass of wine (148 mL), or one 1 oz glass of hard liquor (44 mL). Lifestyle  Take daily care of your teeth and gums.  Stay active. Exercise for at least 30 minutes on 5 or more days each week.  Do not use any products that contain nicotine or tobacco, such as cigarettes, e-cigarettes, and chewing tobacco. If you need help quitting, ask your health care provider.  If you are sexually active, practice safe sex. Use a condom or other form of protection in order to prevent STIs (sexually transmitted infections).  Talk with your health care provider about taking a low-dose aspirin or statin. What's next?  Go to your health care provider once a year for a well check visit.  Ask your health care provider how often you should have your eyes and teeth checked.  Stay up to date on all vaccines. This information is not intended to replace advice given to you by your health care provider. Make sure you discuss any questions you have with your health care provider. Document  Revised: 02/17/2018 Document Reviewed: 02/17/2018 Elsevier Patient Education  2020 [employer].

## 2019-06-27 NOTE — Assessment & Plan Note (Signed)
Compliant to atorvastatin, continue same.

## 2019-06-27 NOTE — Assessment & Plan Note (Signed)
Repeat A1C pending. 

## 2019-08-15 ENCOUNTER — Other Ambulatory Visit: Payer: Self-pay | Admitting: Primary Care

## 2019-08-15 DIAGNOSIS — E039 Hypothyroidism, unspecified: Secondary | ICD-10-CM

## 2019-08-16 ENCOUNTER — Encounter: Payer: Self-pay | Admitting: Family Medicine

## 2019-08-16 ENCOUNTER — Other Ambulatory Visit: Payer: Self-pay

## 2019-08-16 ENCOUNTER — Ambulatory Visit: Payer: Medicare PPO | Admitting: Family Medicine

## 2019-08-16 ENCOUNTER — Ambulatory Visit (INDEPENDENT_AMBULATORY_CARE_PROVIDER_SITE_OTHER)
Admission: RE | Admit: 2019-08-16 | Discharge: 2019-08-16 | Disposition: A | Discharge status: Home / Self Care | Payer: Medicare PPO | Source: Ambulatory Visit | Attending: Family Medicine | Admitting: Family Medicine

## 2019-08-16 VITALS — BP 140/80 | HR 61 | Temp 98.4°F | Ht 62.0 in | Wt 144.0 lb

## 2019-08-16 DIAGNOSIS — M25531 Pain in right wrist: Secondary | ICD-10-CM

## 2019-08-16 DIAGNOSIS — S6991XA Unspecified injury of right wrist, hand and finger(s), initial encounter: Secondary | ICD-10-CM | POA: Diagnosis not present

## 2019-08-16 NOTE — Progress Notes (Signed)
Mallory Sherlin T. Teri Legacy, MD, [place]  [facility] and [place] at [facility] [place], [address]  Phone: [phone]  FAX: [place] - 69 y.o. female  MRN [id number]  Date of Birth: 05/11/1950  Date: 08/16/2019  PCP: Pleas Koch, NP  Referral: Pleas Koch, NP  Chief Complaint  Patient presents with  . Hand Injury    Right    This visit occurred during the SARS-CoV-2 public health emergency.  Safety protocols were in place, including screening questions prior to the visit, additional usage of staff PPE, and extensive cleaning of exam room while observing appropriate contact time as indicated for disinfecting solutions.   Subjective:   Mallory Leblanc is a 69 y.o. very pleasant female patient with Body mass index is 26.34 kg/m. who presents with the following:  She is a pleasant 69 year old lady who I have seen multiple times for some arthritis, and she presents today with a right acute wrist injury that occurred on Friday.  She had a metal cage strike her wrist and she has developed some significant bruising and swelling and she has some focal swelling on the dorsum of the wrist.  Her grip is intact and she has been able to do basic activities at home.  Review of Systems is noted in the HPI, as appropriate   Objective:   BP 140/80   Pulse 61   Temp 98.4 F (36.9 C) (Temporal)   Ht 5\' 2"  (1.575 m)   Wt 144 lb (65.3 kg)   SpO2 98%   BMI 26.34 kg/m    GEN: No acute distress; alert,appropriate. PULM: Breathing comfortably in no respiratory distress PSYCH: Normally interactive.    She has full range of motion at the elbow in all directions.  Her grip is intact bilaterally and equal compared to the left.  Nontender along all phalanges.  Nontender at the basal joint.  Nontender at the distal metacarpals, but there is some tenderness in the proximal metacarpals approximately  in metacarpal 345 and there is a notable enlargement consistent with hematoma on the dorsum of the wrist.  She also has a small ganglion cyst proximally and radially to this.  Radiology: No results found.  Assessment and Plan:     ICD-10-CM   1. Acute pain of right wrist  M25.531 DG Wrist Complete Right   Acute hematoma, dorsum of the wrist.  There is no evidence for acute fracture, but she does have extensive changes about the wrist and hardware secondary to prior ORIF.  Reassurance, this will resolve on its own.  Follow-up: No follow-ups on file.  No orders of the defined types were placed in this encounter.  Medications Discontinued During This Encounter  Medication Reason  . omeprazole (PRILOSEC) 20 MG capsule Completed Course   Orders Placed This Encounter  Procedures  . DG Wrist Complete Right    Signed,  Shardee Dieu T. Mervyn Pflaum, MD   Outpatient Encounter Medications as of 08/16/2019  Medication Sig  . acetaminophen (TYLENOL) 500 MG tablet Take 1,000 mg by mouth every 8 (eight) hours as needed for mild pain or moderate pain.  Marland Kitchen atorvastatin (LIPITOR) 10 MG tablet TAKE 1 TABLET BY MOUTH ONCE A DAY FOR CHOLESTEROL  . Cholecalciferol (D3 VITAMIN PO) Take by mouth.  . diclofenac Sodium (VOLTAREN) 1 % GEL Apply 4 g topically 4 (four) times daily.  . fluticasone (FLONASE) 50 MCG/ACT nasal spray Place 1  spray into both nostrils 2 (two) times daily.  Marland Kitchen letrozole (FEMARA) 2.5 MG tablet Take 1 tablet (2.5 mg total) by mouth daily.  Marland Kitchen levocetirizine (XYZAL) 5 MG tablet TAKE 1 TABLET BY MOUTH ONCE EVERY EVENING FOR ALLERGIES  . levothyroxine (SYNTHROID) 88 MCG tablet Take 1 tablet by mouth every morning on an empty stomach with water only.  No food or other medications for 30 minutes.  . Misc Natural Products (GLUCOSAMINE CHONDROITIN MSM PO) Take 1 tablet by mouth daily.  . Multiple Minerals-Vitamins (CALCIUM & VIT D3 BONE HEALTH PO) Take 1 capsule by mouth daily.  . Omega-3 Fatty  Acids (FISH OIL) 1000 MG CAPS Take 1 capsule by mouth daily.  . [DISCONTINUED] omeprazole (PRILOSEC) 20 MG capsule Take 20 mg by mouth every morning.   No facility-administered encounter medications on file as of 08/16/2019.

## 2019-08-25 ENCOUNTER — Other Ambulatory Visit: Payer: Self-pay

## 2019-08-28 ENCOUNTER — Other Ambulatory Visit (INDEPENDENT_AMBULATORY_CARE_PROVIDER_SITE_OTHER): Payer: Medicare PPO

## 2019-08-28 DIAGNOSIS — E039 Hypothyroidism, unspecified: Secondary | ICD-10-CM | POA: Diagnosis not present

## 2019-08-28 LAB — TSH: TSH: 1.62 u[IU]/mL (ref 0.35–4.50)

## 2019-08-29 ENCOUNTER — Other Ambulatory Visit: Payer: Self-pay | Admitting: Obstetrics and Gynecology

## 2019-08-29 DIAGNOSIS — Z853 Personal history of malignant neoplasm of breast: Secondary | ICD-10-CM | POA: Diagnosis not present

## 2019-08-29 DIAGNOSIS — Z124 Encounter for screening for malignant neoplasm of cervix: Secondary | ICD-10-CM | POA: Diagnosis not present

## 2019-08-29 DIAGNOSIS — Z1211 Encounter for screening for malignant neoplasm of colon: Secondary | ICD-10-CM | POA: Diagnosis not present

## 2019-08-29 DIAGNOSIS — Z1331 Encounter for screening for depression: Secondary | ICD-10-CM | POA: Diagnosis not present

## 2019-09-05 DIAGNOSIS — Z1211 Encounter for screening for malignant neoplasm of colon: Secondary | ICD-10-CM | POA: Diagnosis not present

## 2019-09-05 DIAGNOSIS — Z853 Personal history of malignant neoplasm of breast: Secondary | ICD-10-CM | POA: Diagnosis not present

## 2019-09-05 DIAGNOSIS — Z124 Encounter for screening for malignant neoplasm of cervix: Secondary | ICD-10-CM | POA: Diagnosis not present

## 2019-09-05 DIAGNOSIS — Z1331 Encounter for screening for depression: Secondary | ICD-10-CM | POA: Diagnosis not present

## 2019-09-12 ENCOUNTER — Ambulatory Visit
Admission: RE | Admit: 2019-09-12 | Discharge: 2019-09-12 | Disposition: A | Discharge status: Home / Self Care | Payer: Medicare PPO | Source: Ambulatory Visit | Attending: Obstetrics and Gynecology | Admitting: Obstetrics and Gynecology

## 2019-09-12 ENCOUNTER — Other Ambulatory Visit: Payer: Self-pay | Admitting: Primary Care

## 2019-09-12 DIAGNOSIS — Z853 Personal history of malignant neoplasm of breast: Secondary | ICD-10-CM

## 2019-09-12 DIAGNOSIS — R928 Other abnormal and inconclusive findings on diagnostic imaging of breast: Secondary | ICD-10-CM | POA: Diagnosis not present

## 2019-09-12 DIAGNOSIS — E785 Hyperlipidemia, unspecified: Secondary | ICD-10-CM

## 2019-09-28 ENCOUNTER — Other Ambulatory Visit: Payer: Self-pay | Admitting: Primary Care

## 2019-09-28 DIAGNOSIS — E039 Hypothyroidism, unspecified: Secondary | ICD-10-CM

## 2019-11-27 ENCOUNTER — Encounter: Payer: Self-pay | Admitting: Oncology

## 2019-11-27 ENCOUNTER — Inpatient Hospital Stay: Payer: Medicare PPO | Attending: Oncology

## 2019-11-27 ENCOUNTER — Inpatient Hospital Stay: Payer: Medicare PPO | Admitting: Oncology

## 2019-11-27 ENCOUNTER — Other Ambulatory Visit: Payer: Self-pay

## 2019-11-27 VITALS — BP 161/82 | HR 68 | Temp 98.6°F | Resp 18 | Wt 145.7 lb

## 2019-11-27 DIAGNOSIS — D649 Anemia, unspecified: Secondary | ICD-10-CM

## 2019-11-27 DIAGNOSIS — Z79811 Long term (current) use of aromatase inhibitors: Secondary | ICD-10-CM

## 2019-11-27 DIAGNOSIS — M810 Age-related osteoporosis without current pathological fracture: Secondary | ICD-10-CM

## 2019-11-27 DIAGNOSIS — D0512 Intraductal carcinoma in situ of left breast: Secondary | ICD-10-CM

## 2019-11-27 DIAGNOSIS — N951 Menopausal and female climacteric states: Secondary | ICD-10-CM | POA: Insufficient documentation

## 2019-11-27 LAB — CBC WITH DIFFERENTIAL/PLATELET
Abs Immature Granulocytes: 0.03 10*3/uL (ref 0.00–0.07)
Basophils Absolute: 0.1 10*3/uL (ref 0.0–0.1)
Basophils Relative: 2 %
Eosinophils Absolute: 0.6 10*3/uL — ABNORMAL HIGH (ref 0.0–0.5)
Eosinophils Relative: 8 %
HCT: 32.6 % — ABNORMAL LOW (ref 36.0–46.0)
Hemoglobin: 10.9 g/dL — ABNORMAL LOW (ref 12.0–15.0)
Immature Granulocytes: 0 %
Lymphocytes Relative: 31 %
Lymphs Abs: 2.3 10*3/uL (ref 0.7–4.0)
MCH: 30.4 pg (ref 26.0–34.0)
MCHC: 33.4 g/dL (ref 30.0–36.0)
MCV: 91.1 fL (ref 80.0–100.0)
Monocytes Absolute: 0.7 10*3/uL (ref 0.1–1.0)
Monocytes Relative: 9 %
Neutro Abs: 3.6 10*3/uL (ref 1.7–7.7)
Neutrophils Relative %: 50 %
Platelets: 218 10*3/uL (ref 150–400)
RBC: 3.58 MIL/uL — ABNORMAL LOW (ref 3.87–5.11)
RDW: 13.6 % (ref 11.5–15.5)
WBC: 7.3 10*3/uL (ref 4.0–10.5)
nRBC: 0 % (ref 0.0–0.2)

## 2019-11-27 LAB — COMPREHENSIVE METABOLIC PANEL
ALT: 23 U/L (ref 0–44)
AST: 24 U/L (ref 15–41)
Albumin: 4.5 g/dL (ref 3.5–5.0)
Alkaline Phosphatase: 91 U/L (ref 38–126)
Anion gap: 11 (ref 5–15)
BUN: 13 mg/dL (ref 8–23)
CO2: 26 mmol/L (ref 22–32)
Calcium: 9.8 mg/dL (ref 8.9–10.3)
Chloride: 102 mmol/L (ref 98–111)
Creatinine, Ser: 0.7 mg/dL (ref 0.44–1.00)
GFR calc Af Amer: >60 mL/min (ref >60)
GFR calc non Af Amer: >60 mL/min (ref >60)
Glucose, Bld: 111 mg/dL — ABNORMAL HIGH (ref 70–99)
Potassium: 4.2 mmol/L (ref 3.5–5.1)
Sodium: 139 mmol/L (ref 135–145)
Total Bilirubin: 0.6 mg/dL (ref 0.3–1.2)
Total Protein: 7.6 g/dL (ref 6.5–8.1)

## 2019-11-27 LAB — RETIC PANEL
Immature Retic Fract: 13.4 % (ref 2.3–15.9)
RBC.: 3.57 MIL/uL — ABNORMAL LOW (ref 3.87–5.11)
Retic Count, Absolute: 65 10*3/uL (ref 19.0–186.0)
Retic Ct Pct: 1.8 % (ref 0.4–3.1)
Reticulocyte Hemoglobin: 34.3 pg (ref >27.9)

## 2019-11-27 LAB — VITAMIN B12: Vitamin B-12: 294 pg/mL (ref 180–914)

## 2019-11-27 LAB — FOLATE: Folate: 16.1 ng/mL (ref >5.9)

## 2019-11-27 MED ORDER — CYANOCOBALAMIN 500 MCG PO TABS
500.0000 ug | ORAL_TABLET | Freq: Every day | ORAL | 1 refills | Status: DC
Start: 2019-11-27 — End: 2021-09-06

## 2019-11-27 MED ORDER — LETROZOLE 2.5 MG PO TABS: 2.5000 mg | ORAL_TABLET | Freq: Every day | ORAL | 1 refills | Status: DC

## 2019-11-27 NOTE — Progress Notes (Signed)
[facility] follow up visit Patient Care Team: Pleas Koch, NP as PCP - General (Internal Medicine)  REASON FOR VISIT Follow up for treatment of DCIS  HISTORY OF PRESENTING ILLNESS: Mallory Leblanc 69 y.o. 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 Patient had screening mammogram done on 08/21/2016 with [facility] healthcare system which revealed grouped calcification that approximately 1 cm in the upper outer quadrant of the left breast 9 cm from nipple which are indicated to terminate a prominent left axillary lymph node is present and is unchanged when compared to the ultrasound of 13 and 2711 studies no other dermatitis is seen in the left breast there are no suspicious masses malignant calcifications site of architecture distortion or concerning asymmetries in the right breast. Patient had diagnostic mammogram unilateral left down on September 09 2006, followed by rest left upper outer quadrant stereotactic biopsy. Pathology showed DCIS intermediate grade, calcifications associated with DCIS. DCIS is present in 4 out of 6 blocks with the largest focus measuring 6 mm. ER more than 90% positive. PR 50-90% positive.  Post lumpectomy pathology showed: DCIS status post lumpectomy. Superior margin is negative but close. 2 mm margin is recommended as it is associated with reduced risk of ipsilateral tumor recurrence. Case was discussed at breast tumor conference and consensus was patient to undergo radiation followed by adjuvant endocrine therapy for 5 years.   She is postmenopausal. Started on Letrozole 2.$RemoveBefore'5mg'[id number]$  daily since April 2019.   # 08/27/2017 Diagnostic Mammogram showed benign findings. Will need diagnostic mammogram in 1 year.  INTERVAL HISTORY Patient with oncology history listed above reviewed by me today presents for follow up for follow up for DCIS.  Has been taking  letrozole 2.5 mg daily, since April 2019.  Patient reports that she tolerates well.  Manageable hot flash. No new symptoms or concerns.   Review of Systems  Constitutional: Negative for appetite change, chills, diaphoresis, fatigue and fever.  HENT:   Negative for hearing loss, lump/mass, nosebleeds and voice change.   Eyes: Negative for eye problems.  Respiratory: Negative for chest tightness, cough, hemoptysis and shortness of breath.   Cardiovascular: Negative for chest pain and leg swelling.  Gastrointestinal: Negative for abdominal distention, abdominal pain, blood in stool, constipation and diarrhea.  Endocrine: Positive for hot flashes.  Genitourinary: Negative for bladder incontinence, difficulty urinating, frequency and hematuria.   Musculoskeletal: Negative for arthralgias, back pain, flank pain and gait problem.  Skin: Negative for itching and rash.  Neurological: Negative for dizziness, extremity weakness, gait problem, headaches and numbness.  Hematological: Negative for adenopathy. Does not bruise/bleed easily.  Psychiatric/Behavioral: Negative for confusion, decreased concentration and sleep disturbance. The patient is not nervous/anxious.     MEDICAL HISTORY: Past Medical History:  Diagnosis Date  . Anemia   . Arthritis    LEFT KNEE  . Breast cancer ([place]) 2018   Left Breast Cancer- DCIS  . Genital warts   . GERD (gastroesophageal reflux disease)    OCC  . Hyperlipidemia   . Hypothyroidism   . Personal history of radiation therapy 2018   F/U left breast cancer    SURGICAL HISTORY: Past Surgical History:  Procedure Laterality Date  . APPENDECTOMY    . BREAST BIOPSY Left 09/21/2016   DCIS  . BREAST LUMPECTOMY Left 2018   DCIS  . cyst     on the left ovary early 80's  .  ETHMOIDECTOMY Bilateral 01/17/2019   Procedure: ETHMOIDECTOMY;  Surgeon: Clyde Canterbury, MD;  Location: [place];  Service: ENT;  Laterality: Bilateral;  . FRONTAL SINUS  EXPLORATION Bilateral 01/17/2019   Procedure: FRONTAL SINUS EXPLORATION;  Surgeon: Clyde Canterbury, MD;  Location: [place];  Service: ENT;  Laterality: Bilateral;  . IMAGE GUIDED SINUS SURGERY N/A 01/17/2019   Procedure: IMAGE GUIDED SINUS SURGERY;  Surgeon: Clyde Canterbury, MD;  Location: [place];  Service: ENT;  Laterality: N/A;  NEED STRYKER DISK put disk on or charge nurse desk 10-30  kp gave 2nd disk to Susan 11-4 kp  . MAXILLARY ANTROSTOMY Bilateral 01/17/2019   Procedure: MAXILLARY ANTROSTOMY;  Surgeon: Clyde Canterbury, MD;  Location: [place];  Service: ENT;  Laterality: Bilateral;  . PARTIAL MASTECTOMY WITH NEEDLE LOCALIZATION Left 10/16/2016   Procedure: PARTIAL MASTECTOMY WITH NEEDLE LOCALIZATION;  Surgeon: Leonie Green, MD;  Location: [facility];  Service: General;  Laterality: Left;  . SENTINEL NODE BIOPSY Left 10/16/2016   Procedure: SENTINEL NODE BIOPSY;  Surgeon: Leonie Green, MD;  Location: [facility];  Service: General;  Laterality: Left;  . SPHENOIDECTOMY Left 01/17/2019   Procedure: SPHENOIDOTOMY;  Surgeon: Clyde Canterbury, MD;  Location: [place];  Service: ENT;  Laterality: Left;  . WRIST FRACTURE SURGERY Right     SOCIAL HISTORY: Social History   Socioeconomic History  . Marital status: Married    Spouse name: Not on file  . Number of children: Not on file  . Years of education: Not on file  . Highest education level: Not on file  Occupational History  . Not on file  Tobacco Use  . Smoking status: Never Smoker  . Smokeless tobacco: Never Used  Vaping Use  . Vaping Use: Never used  Substance and Sexual Activity  . Alcohol use: Yes    Alcohol/week: 6.0 standard drinks    Types: 6 Cans of beer per week    Comment: BEER OCC  . Drug use: No  . Sexual activity: Not on file  Other Topics Concern  . Not on file  Social History Narrative   Married.   1 child.    Works as a Optometrist.   Enjoys riding  her motorcycle, walking her dog, traveling to the mountains.   Social Determinants of Health   Financial Resource Strain:   . Difficulty of Paying Living Expenses: Not on file  Food Insecurity:   . Worried About Charity fundraiser in the Last Year: Not on file  . Ran Out of Food in the Last Year: Not on file  Transportation Needs:   . Lack of Transportation (Medical): Not on file  . Lack of Transportation (Non-Medical): Not on file  Physical Activity:   . Days of Exercise per Week: Not on file  . Minutes of Exercise per Session: Not on file  Stress:   . Feeling of Stress : Not on file  Social Connections:   . Frequency of Communication with Friends and Family: Not on file  . Frequency of Social Gatherings with Friends and Family: Not on file  . Attends Religious Services: Not on file  . Active Member of Clubs or Organizations: Not on file  . Attends Archivist Meetings: Not on file  . Marital Status: Not on file  Intimate Partner Violence:   . Fear of Current or Ex-Partner: Not on file  . Emotionally Abused: Not on file  . Physically Abused: Not on file  .  Sexually Abused: Not on file    FAMILY HISTORY Family History  Problem Relation Age of Onset  . Heart disease Father     ALLERGIES:  is allergic to sulfa antibiotics.  MEDICATIONS:  Current Outpatient Medications  Medication Sig Dispense Refill  . acetaminophen (TYLENOL) 500 MG tablet Take 1,000 mg by mouth every 8 (eight) hours as needed for mild pain or moderate pain.    Marland Kitchen atorvastatin (LIPITOR) 10 MG tablet TAKE 1 TABLET BY MOUTH ONCE A DAY FOR CHOLESTEROL 90 tablet 1  . Cholecalciferol (D3 VITAMIN PO) Take by mouth.    . diclofenac Sodium (VOLTAREN) 1 % GEL Apply 4 g topically 4 (four) times daily. 500 g 5  . letrozole (FEMARA) 2.5 MG tablet Take 1 tablet (2.5 mg total) by mouth daily. 90 tablet 1  . levocetirizine (XYZAL) 5 MG tablet TAKE 1 TABLET BY MOUTH ONCE EVERY EVENING FOR ALLERGIES 90 tablet 2   . levothyroxine (SYNTHROID) 88 MCG tablet TAKE 1 TAB BY MOUTH ONCE DAILY. TAKE ON AN EMPTY STOMACH WITH A GLASS OF WATER ATLEAST 30-60 MINUTES BEFORE BREAKFAST 90 tablet 1  . Multiple Minerals-Vitamins (CALCIUM & VIT D3 BONE HEALTH PO) Take 1 capsule by mouth daily.    Marland Kitchen omeprazole (PRILOSEC) 10 MG capsule Take 10 mg by mouth daily.    . fluticasone (FLONASE) 50 MCG/ACT nasal spray Place 1 spray into both nostrils 2 (two) times daily. (Patient not taking: Reported on 11/27/2019) 16 g 0  . Misc Natural Products (GLUCOSAMINE CHONDROITIN MSM PO) Take 1 tablet by mouth daily. (Patient not taking: Reported on 11/27/2019)    . Omega-3 Fatty Acids (FISH OIL) 1000 MG CAPS Take 1 capsule by mouth daily.     No current facility-administered medications for this visit.    PHYSICAL EXAMINATION:  ECOG PERFORMANCE STATUS: 0 - Asymptomatic   Vitals:   11/27/19 1028  BP: (!) 161/82  Pulse: 68  Resp: 18  Temp: 98.6 F (37 C)  SpO2: 100%    Filed Weights   11/27/19 1028  Weight: 145 lb 11.2 oz (66.1 kg)     Physical Exam Constitutional:      General: She is not in acute distress.    Appearance: She is not diaphoretic.  HENT:     Head: Normocephalic and atraumatic.     Nose: Nose normal.     Mouth/Throat:     Pharynx: No oropharyngeal exudate.  Eyes:     General: No scleral icterus.    Pupils: Pupils are equal, round, and reactive to light.  Cardiovascular:     Rate and Rhythm: Normal rate and regular rhythm.     Heart sounds: No murmur heard.   Pulmonary:     Effort: Pulmonary effort is normal. No respiratory distress.     Breath sounds: No rales.  Chest:     Chest wall: No tenderness.  Abdominal:     General: There is no distension.     Palpations: Abdomen is soft.     Tenderness: There is no abdominal tenderness.  Musculoskeletal:        General: Normal range of motion.     Cervical back: Normal range of motion and neck supple.  Skin:    General: Skin is warm and dry.      Findings: No erythema.  Neurological:     Mental Status: She is alert and oriented to person, place, and time.     Cranial Nerves: No cranial nerve deficit.  Motor: No abnormal muscle tone.     Coordination: Coordination normal.  Psychiatric:        Mood and Affect: Affect normal.   Breast exam was performed in seated and lying down position. Patient is status post lumpectomy left breast with a well-healed surgical scar, with local fibrotic changes at the surgical site. No evidence of any palpable masses. No evidence of axillary adenopathy. She has focal radiation induced hyperpigmentation and also breast edema.             RADIOGRAPHIC STUDIES: I have personally reviewed the radiological images as listed and agree with the findings in the report MM DIAG BREAST TOMO BILATERAL  Result Date: 09/12/2019 CLINICAL DATA:  69 year old female with history of left breast cancer in 2018 status post lumpectomy and radiation. Patient presents for annual exam. No problems today. EXAM: DIGITAL DIAGNOSTIC BILATERAL MAMMOGRAM WITH TOMO AND CAD COMPARISON:  Previous exam(s). ACR Breast Density Category b: There are scattered areas of fibroglandular density. FINDINGS: Right breast: No suspicious mass, distortion, or microcalcifications are identified to suggest presence of malignancy. Left breast: Spot 2D magnification views of the lumpectomy bed were performed. There are stable postsurgical changes. No suspicious mass, distortion, or microcalcifications are identified to suggest presence of malignancy. Mammographic images were processed with CAD. IMPRESSION: Postsurgical changes in the left breast. No mammographic evidence of malignancy bilaterally. RECOMMENDATION: Diagnostic mammogram is suggested in 1 year. (Code:[id number]-B-01Y) I have discussed the findings and recommendations with the patient. If applicable, a reminder letter will be sent to the patient regarding the next appointment. BI-RADS CATEGORY  2: Benign.  Electronically Signed   By: Audie Pinto M.D.   On: 09/12/2019 09:50     ASSESSMENT/PLAN Cancer Staging Ductal carcinoma in situ (DCIS) of left breast Staging form: Breast, AJCC 8th Edition - Clinical stage from 09/21/2016: Stage 0 (cTis (DCIS), cN0, cM0, ER: Positive, PR: Positive, HER2: Negative) - Signed by Earlie Server, MD on 10/22/2016 - Pathologic stage from 10/16/2016: Stage 0 (pTis (DCIS), pN0(sn), cM0, ER: Positive, PR: Positive, HER2: Negative) - Signed by Earlie Server, MD on 10/22/2016  .69 yo postmenapausal female with history of DCIS s/p lumpectomy presents to follow up on management of DCIS.   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 Osteoporosis without current pathological fracture, unspecified osteoporosis type   3. Aromatase inhibitor use   4. Normocytic anemia    # DCIS,Continue letrozole 2.5 mg daily to complete total of 5 years.   Labs reviewed and discussed with patient. Clinically she is doing very well and tolerates current regimen Continue letrozole 2.5 mg daily. June 2021 diagnostic bilateral mammogram was independently reviewed by me and discussed with patient. No evidence of recurrence. Patient gets mammogram ordered through GYN clinic  # Anemia, hemoglobin is 10.9 today, slightly lower. Further work-up include reticulocyte panel, iron, TIBC ferritin, folate and vitamin B12. Labs are reviewed. Vitamin B12 level is on the low normal end.  I recommend patient to start vitamin B12 supplementation 500 MCG daily.  # Osteoporosis, continue vitamin D and calcium supplementation.   She is aware about pathological fracture risk. Discussed with her about rationale and potential side effects of starting bisphosphonate for osteoporosis in the context of chronic aromatase inhibitor treatments.  She declined. Patient is due for repeat DEXA scan and patient prefers to defer it for now.  All questions were answered. The patient knows to call the clinic with any  problems, questions or concerns. Follow up in  6 months.   Earlie Server, MD, PhD Hematology Oncology [facility] Pager- [phone] 11/27/2019

## 2019-11-27 NOTE — Progress Notes (Signed)
Patient here for oncology follow-up appointment, expresses no complaints or concerns at this time.    

## 2019-11-28 ENCOUNTER — Telehealth: Payer: Self-pay

## 2019-11-28 LAB — IRON AND TIBC
Iron: 87 ug/dL (ref 28–170)
Saturation Ratios: 22 % (ref 10.4–31.8)
TIBC: 402 ug/dL (ref 250–450)
UIBC: 315 ug/dL

## 2019-11-28 NOTE — Telephone Encounter (Signed)
-----   Message from Earlie Server, MD sent at 11/27/2019  5:45 PM EDT ----- Please let patient know that vitamin B12 level is normal but at lower normal end.  I recommend patient to start taking vitamin B12 500 MCG daily and I will repeat her level at the next visit.

## 2019-11-28 NOTE — Telephone Encounter (Signed)
Pt notified and states she will start on medication today or tomorrow.

## 2020-01-01 ENCOUNTER — Other Ambulatory Visit: Payer: Self-pay | Admitting: Primary Care

## 2020-01-01 DIAGNOSIS — E039 Hypothyroidism, unspecified: Secondary | ICD-10-CM

## 2020-01-28 NOTE — Progress Notes (Signed)
Mallory Leblanc T. Cole Eastridge, MD, [place]  Primary Care and [place] at [facility] [place], [address]  Phone: [phone]  FAX: [place] - 69 y.o. female  MRN [id number]  Date of Birth: [birth year]  Date: 01/29/2020  PCP: Pleas Koch, NP  Referral: Pleas Koch, NP  Chief Complaint  Patient presents with  . Knee Pain    Left    This visit occurred during the SARS-CoV-2 public health emergency.  Safety protocols were in place, including screening questions prior to the visit, additional usage of staff PPE, and extensive cleaning of exam room while observing appropriate contact time as indicated for disinfecting solutions.   Subjective:   Mallory Leblanc is a 69 y.o. very pleasant female patient with Body mass index is 26.98 kg/m. who presents with the following:  [name] is here with some L sided knee pain.  She is globally been doing pretty well.  She is retired now from the [employer].  She was on her knees quite a bit at the end of last week when her mother put her dog down secondary to longstanding issues and problems.  She was having some anterior pain as well as some tingling after being on her knees.  She has been taking some Tylenol using some Voltaren gel as needed.  Synvisc x 2 series, last 04/2019.    Review of Systems is noted in the HPI, as appropriate   Objective:   BP 140/76   Pulse 72   Temp 98.4 F (36.9 C) (Temporal)   Ht 5\' 2"  (1.575 m)   Wt 147 lb 8 oz (66.9 kg)   SpO2 97%   BMI 26.98 kg/m   Left knee: Full extension and flexion to 125 degrees.  Nontender at the patellar and patellar facets.  Stable to varus and valgus stress.  No effusion.  Quad and patellar tendons are intact.  No pain with flexion pinch testing.  Bounce home testing is negative as well as McMurray's.  Neurovascularly intact  Radiology: No results  found.  Assessment and Plan:     ICD-10-CM   1. Acute pain of left knee  M25.562    Reassuring exam, she is basically nontender at this point.  Likely secondary to pressure to the anterior knee.  Keep up range of motion, Tylenol, Voltaren gel as needed.  No orders of the defined types were placed in this encounter.  Medications Discontinued During This Encounter  Medication Reason  . Omega-3 Fatty Acids (FISH OIL) 1000 MG CAPS Patient Preference  . Misc Natural Products (GLUCOSAMINE CHONDROITIN MSM PO) Patient Preference   No orders of the defined types were placed in this encounter.   Follow-up: No follow-ups on file.  Signed,  Maud Deed. Noell Shular, MD   Outpatient Encounter Medications as of 01/29/2020  Medication Sig  . acetaminophen (TYLENOL) 500 MG tablet Take 1,000 mg by mouth every 8 (eight) hours as needed for mild pain or moderate pain.  Marland Kitchen atorvastatin (LIPITOR) 10 MG tablet TAKE 1 TABLET BY MOUTH ONCE A DAY FOR CHOLESTEROL  . Cholecalciferol (D3 VITAMIN PO) Take by mouth.  . diclofenac Sodium (VOLTAREN) 1 % GEL Apply 4 g topically 4 (four) times daily.  . fluticasone (FLONASE) 50 MCG/ACT nasal spray Place 1 spray into both nostrils 2 (two) times daily.  Marland Kitchen letrozole (FEMARA) 2.5 MG tablet Take 1 tablet (2.5 mg total) by mouth daily.  Marland Kitchen  levocetirizine (XYZAL) 5 MG tablet TAKE 1 TABLET BY MOUTH ONCE EVERY EVENING FOR ALLERGIES  . levothyroxine (SYNTHROID) 88 MCG tablet TAKE 1 TAB BY MOUTH ONCE DAILY. TAKE ON AN EMPTY STOMACH WITH A GLASS OF WATER ATLEAST 30-60 MINUTES BEFORE BREAKFAST  . Multiple Minerals-Vitamins (CALCIUM & VIT D3 BONE HEALTH PO) Take 1 capsule by mouth daily.  Marland Kitchen omeprazole (PRILOSEC) 10 MG capsule Take 10 mg by mouth daily.  . vitamin B-12 (CYANOCOBALAMIN) 500 MCG tablet Take 1 tablet (500 mcg total) by mouth daily.  . [DISCONTINUED] Misc Natural Products (GLUCOSAMINE CHONDROITIN MSM PO) Take 1 tablet by mouth daily. (Patient not taking: Reported on  11/27/2019)  . [DISCONTINUED] Omega-3 Fatty Acids (FISH OIL) 1000 MG CAPS Take 1 capsule by mouth daily.   No facility-administered encounter medications on file as of 01/29/2020.

## 2020-01-29 ENCOUNTER — Other Ambulatory Visit: Payer: Self-pay

## 2020-01-29 ENCOUNTER — Ambulatory Visit: Payer: Medicare PPO | Admitting: Family Medicine

## 2020-01-29 ENCOUNTER — Encounter: Payer: Self-pay | Admitting: Family Medicine

## 2020-01-29 VITALS — BP 140/76 | HR 72 | Temp 98.4°F | Ht 62.0 in | Wt 147.5 lb

## 2020-01-29 DIAGNOSIS — M25562 Pain in left knee: Secondary | ICD-10-CM | POA: Diagnosis not present

## 2020-03-07 DIAGNOSIS — J339 Nasal polyp, unspecified: Secondary | ICD-10-CM | POA: Diagnosis not present

## 2020-03-07 DIAGNOSIS — J329 Chronic sinusitis, unspecified: Secondary | ICD-10-CM | POA: Diagnosis not present

## 2020-03-11 ENCOUNTER — Other Ambulatory Visit: Payer: Self-pay | Admitting: Primary Care

## 2020-03-11 DIAGNOSIS — E785 Hyperlipidemia, unspecified: Secondary | ICD-10-CM

## 2020-04-18 ENCOUNTER — Encounter: Payer: Self-pay | Admitting: Family Medicine

## 2020-04-18 ENCOUNTER — Other Ambulatory Visit: Payer: Self-pay

## 2020-04-18 ENCOUNTER — Ambulatory Visit: Payer: Medicare PPO | Admitting: Family Medicine

## 2020-04-18 VITALS — BP 140/82 | HR 70 | Temp 98.1°F | Ht 62.0 in | Wt 152.8 lb

## 2020-04-18 DIAGNOSIS — M25561 Pain in right knee: Secondary | ICD-10-CM | POA: Diagnosis not present

## 2020-04-18 NOTE — Progress Notes (Signed)
Mallory Leblanc T. Alexxander Kurt, MD, [place]  Primary Care and [place] at [facility] [place], [address]  Phone: [phone]  FAX: [place] - 70 y.o. female  MRN [id number]  Date of Birth: 07/29/1950  Date: 04/18/2020  PCP: Pleas Koch, NP  Referral: Pleas Koch, NP  Chief Complaint  Patient presents with  . Fall    In [place]  . Knee Pain    Right    This visit occurred during the SARS-CoV-2 public health emergency.  Safety protocols were in place, including screening questions prior to the visit, additional usage of staff PPE, and extensive cleaning of exam room while observing appropriate contact time as indicated for disinfecting solutions.   Subjective:   Mallory Leblanc is a 70 y.o. very pleasant female patient with Body mass index is 27.94 kg/m. who presents with the following:  Fell during recent snow and ice:  Fell walking the dog.  Slepping 19th after the second snow.    Initially, she was having some swelling in the knee and having some difficulty getting in and out of a car rising from a seated position.  At this point she is doing mostly better, and she does not have any swelling, and she is having minimal to no functional impairment at home.  Review of Systems is noted in the HPI, as appropriate   Objective:   BP 140/82   Pulse 70   Temp 98.1 F (36.7 C) (Temporal)   Ht 5\' 2"  (1.575 m)   Wt 152 lb 12 oz (69.3 kg)   SpO2 98%   BMI 27.94 kg/m   Right knee: Full extension and flexion to 130.  Stable to varus and valgus stress.  No effusion.  Nontender at the patella, tibia, fibula, as well as the femur.  Quad and patellar tendons are intact.  All ligamentous structures are intact with no pain at terminal endpoints.  McMurray's and flexion pinch as well as bounce home testing are normal.  Radiology: No results found.  Assessment and Plan:     ICD-10-CM    1. Acute pain of right knee  M25.561    Knee pain after slipping on the ice, now her symptoms have is resolved.  Reassurance.  Occasional Tylenol certainly reasonable for now.  Signed,  Maud Deed. Shanoah Asbill, MD   Outpatient Encounter Medications as of 04/18/2020  Medication Sig  . acetaminophen (TYLENOL) 500 MG tablet Take 1,000 mg by mouth every 8 (eight) hours as needed for mild pain or moderate pain.  Marland Kitchen atorvastatin (LIPITOR) 10 MG tablet TAKE 1 TABLET BY MOUTH ONCE A DAY FOR CHOLESTEROL  . Cholecalciferol (D3 VITAMIN PO) Take by mouth.  . diclofenac Sodium (VOLTAREN) 1 % GEL Apply 4 g topically 4 (four) times daily.  . fluticasone (FLONASE) 50 MCG/ACT nasal spray Place 1 spray into both nostrils 2 (two) times daily.  Marland Kitchen letrozole (FEMARA) 2.5 MG tablet Take 1 tablet (2.5 mg total) by mouth daily.  Marland Kitchen levocetirizine (XYZAL) 5 MG tablet TAKE 1 TABLET BY MOUTH ONCE EVERY EVENING FOR ALLERGIES  . levothyroxine (SYNTHROID) 88 MCG tablet TAKE 1 TAB BY MOUTH ONCE DAILY. TAKE ON AN EMPTY STOMACH WITH A GLASS OF WATER ATLEAST 30-60 MINUTES BEFORE BREAKFAST  . Multiple Minerals-Vitamins (CALCIUM & VIT D3 BONE HEALTH PO) Take 1 capsule by mouth daily.  Marland Kitchen omeprazole (PRILOSEC) 10 MG capsule Take 10 mg by mouth daily.  Marland Kitchen  TURMERIC PO Take 1 tablet by mouth daily.  . vitamin B-12 (CYANOCOBALAMIN) 500 MCG tablet Take 1 tablet (500 mcg total) by mouth daily.   No facility-administered encounter medications on file as of 04/18/2020.

## 2020-04-29 ENCOUNTER — Telehealth: Payer: Self-pay | Admitting: Primary Care

## 2020-04-29 NOTE — Telephone Encounter (Signed)
I have verified that she did pay copay on day of visit. I have sent a message over to [employer] to see if this is something they can help with or if I need to reach out to someone else. When I hear back from them, I will reach out to patient with next steps.

## 2020-04-29 NOTE — Telephone Encounter (Signed)
Patient called in stating that she is receiving a bill from her office visit back in November 2021 . She states that day she came in she paid her copay. The patient has it on her bank statement where she paid it. Patient is requesting a call back to discuss. EM

## 2020-05-14 NOTE — Telephone Encounter (Signed)
I never received a response from [employer]. I reached out again today and they will have someone reach out to patient to discuss.

## 2020-05-27 ENCOUNTER — Encounter: Payer: Self-pay | Admitting: Oncology

## 2020-05-27 ENCOUNTER — Other Ambulatory Visit: Payer: Self-pay

## 2020-05-27 ENCOUNTER — Inpatient Hospital Stay: Payer: Medicare PPO | Admitting: Oncology

## 2020-05-27 ENCOUNTER — Inpatient Hospital Stay: Payer: Medicare PPO | Attending: Oncology

## 2020-05-27 VITALS — BP 167/88 | HR 73 | Temp 98.0°F | Resp 20 | Wt 150.9 lb

## 2020-05-27 DIAGNOSIS — M810 Age-related osteoporosis without current pathological fracture: Secondary | ICD-10-CM | POA: Diagnosis not present

## 2020-05-27 DIAGNOSIS — D649 Anemia, unspecified: Secondary | ICD-10-CM

## 2020-05-27 DIAGNOSIS — D0512 Intraductal carcinoma in situ of left breast: Secondary | ICD-10-CM | POA: Diagnosis not present

## 2020-05-27 DIAGNOSIS — Z79811 Long term (current) use of aromatase inhibitors: Secondary | ICD-10-CM | POA: Diagnosis not present

## 2020-05-27 DIAGNOSIS — Z79899 Other long term (current) drug therapy: Secondary | ICD-10-CM | POA: Diagnosis not present

## 2020-05-27 LAB — COMPREHENSIVE METABOLIC PANEL
ALT: 20 U/L (ref 0–44)
AST: 24 U/L (ref 15–41)
Albumin: 4.7 g/dL (ref 3.5–5.0)
Alkaline Phosphatase: 81 U/L (ref 38–126)
Anion gap: 9 (ref 5–15)
BUN: 18 mg/dL (ref 8–23)
CO2: 26 mmol/L (ref 22–32)
Calcium: 9.5 mg/dL (ref 8.9–10.3)
Chloride: 101 mmol/L (ref 98–111)
Creatinine, Ser: 0.84 mg/dL (ref 0.44–1.00)
GFR, Estimated: >60 mL/min (ref >60)
Glucose, Bld: 112 mg/dL — ABNORMAL HIGH (ref 70–99)
Potassium: 4.3 mmol/L (ref 3.5–5.1)
Sodium: 136 mmol/L (ref 135–145)
Total Bilirubin: 0.8 mg/dL (ref 0.3–1.2)
Total Protein: 7.9 g/dL (ref 6.5–8.1)

## 2020-05-27 LAB — CBC WITH DIFFERENTIAL/PLATELET
Abs Immature Granulocytes: 0.02 10*3/uL (ref 0.00–0.07)
Basophils Absolute: 0.1 10*3/uL (ref 0.0–0.1)
Basophils Relative: 1 %
Eosinophils Absolute: 0.6 10*3/uL — ABNORMAL HIGH (ref 0.0–0.5)
Eosinophils Relative: 8 %
HCT: 34.8 % — ABNORMAL LOW (ref 36.0–46.0)
Hemoglobin: 11.4 g/dL — ABNORMAL LOW (ref 12.0–15.0)
Immature Granulocytes: 0 %
Lymphocytes Relative: 28 %
Lymphs Abs: 2.2 10*3/uL (ref 0.7–4.0)
MCH: 30.6 pg (ref 26.0–34.0)
MCHC: 32.8 g/dL (ref 30.0–36.0)
MCV: 93.3 fL (ref 80.0–100.0)
Monocytes Absolute: 0.6 10*3/uL (ref 0.1–1.0)
Monocytes Relative: 8 %
Neutro Abs: 4.2 10*3/uL (ref 1.7–7.7)
Neutrophils Relative %: 55 %
Platelets: 228 10*3/uL (ref 150–400)
RBC: 3.73 MIL/uL — ABNORMAL LOW (ref 3.87–5.11)
RDW: 13.4 % (ref 11.5–15.5)
WBC: 7.8 10*3/uL (ref 4.0–10.5)
nRBC: 0 % (ref 0.0–0.2)

## 2020-05-27 LAB — VITAMIN B12: Vitamin B-12: 408 pg/mL (ref 180–914)

## 2020-05-27 MED ORDER — LETROZOLE 2.5 MG PO TABS: 2.5000 mg | ORAL_TABLET | Freq: Every day | ORAL | 1 refills | Status: DC

## 2020-05-27 NOTE — Progress Notes (Signed)
[facility] follow up visit Patient Care Team: Pleas Koch, NP as PCP - General (Internal Medicine)  REASON FOR VISIT Follow up for treatment of DCIS  HISTORY OF PRESENTING ILLNESS: Mallory Leblanc 70 y.o. 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 Patient had screening mammogram done on 08/21/2016 with [facility] healthcare system which revealed grouped calcification that approximately 1 cm in the upper outer quadrant of the left breast 9 cm from nipple which are indicated to terminate a prominent left axillary lymph node is present and is unchanged when compared to the ultrasound of 13 and 2711 studies no other dermatitis is seen in the left breast there are no suspicious masses malignant calcifications site of architecture distortion or concerning asymmetries in the right breast. Patient had diagnostic mammogram unilateral left down on September 09 2006, followed by rest left upper outer quadrant stereotactic biopsy. Pathology showed DCIS intermediate grade, calcifications associated with DCIS. DCIS is present in 4 out of 6 blocks with the largest focus measuring 6 mm. ER more than 90% positive. PR 50-90% positive.  Post lumpectomy pathology showed: DCIS status post lumpectomy. Superior margin is negative but close. 2 mm margin is recommended as it is associated with reduced risk of ipsilateral tumor recurrence. Case was discussed at breast tumor conference and consensus was patient to undergo radiation followed by adjuvant endocrine therapy for 5 years.   She is postmenopausal. Started on Letrozole 2.2m daily since April 2019.   # 08/27/2017 Diagnostic Mammogram showed benign findings. Will need diagnostic mammogram in 1 year.  INTERVAL HISTORY Patient with oncology history listed above reviewed by me today presents for follow up for follow up for DCIS.  Has been taking  letrozole 2.5 mg daily, since April 2019.  Manageable side effects. She felt an area around her left lumpectomy scar and want me to take a look at it.  Otherwise no new complaints.   Review of Systems  Constitutional: Negative for appetite change, chills, diaphoresis, fatigue and fever.  HENT:   Negative for hearing loss, lump/mass, nosebleeds and voice change.   Eyes: Negative for eye problems.  Respiratory: Negative for chest tightness, cough, hemoptysis and shortness of breath.   Cardiovascular: Negative for chest pain and leg swelling.  Gastrointestinal: Negative for abdominal distention, abdominal pain, blood in stool, constipation and diarrhea.  Endocrine: Positive for hot flashes.  Genitourinary: Negative for bladder incontinence, difficulty urinating, frequency and hematuria.   Musculoskeletal: Negative for arthralgias, back pain, flank pain and gait problem.  Skin: Negative for itching and rash.  Neurological: Negative for dizziness, extremity weakness, gait problem, headaches and numbness.  Hematological: Negative for adenopathy. Does not bruise/bleed easily.  Psychiatric/Behavioral: Negative for confusion, decreased concentration and sleep disturbance. The patient is not nervous/anxious.     MEDICAL HISTORY: Past Medical History:  Diagnosis Date  . Anemia   . Arthritis    LEFT KNEE  . [place]   Left Breast Cancer- DCIS  . Genital warts   . GERD (gastroesophageal reflux disease)    OCC  . Hyperlipidemia   . Hypothyroidism   . Personal history of radiation therapy 2018   F/U left breast cancer    SURGICAL HISTORY: Past Surgical History:  Procedure Laterality Date  . APPENDECTOMY    . BREAST BIOPSY Left 09/21/2016   DCIS  . BREAST LUMPECTOMY Left 2018   DCIS  . cyst  on the left ovary early 80's  . ETHMOIDECTOMY Bilateral 01/17/2019   Procedure: ETHMOIDECTOMY;  Surgeon: Clyde Canterbury, MD;  Location: [place];  Service: ENT;   Laterality: Bilateral;  . FRONTAL SINUS EXPLORATION Bilateral 01/17/2019   Procedure: FRONTAL SINUS EXPLORATION;  Surgeon: Clyde Canterbury, MD;  Location: [place];  Service: ENT;  Laterality: Bilateral;  . IMAGE GUIDED SINUS SURGERY N/A 01/17/2019   Procedure: IMAGE GUIDED SINUS SURGERY;  Surgeon: Clyde Canterbury, MD;  Location: [place];  Service: ENT;  Laterality: N/A;  NEED STRYKER DISK put disk on or charge nurse desk 10-30  kp gave 2nd disk to Susan 11-4 kp  . MAXILLARY ANTROSTOMY Bilateral 01/17/2019   Procedure: MAXILLARY ANTROSTOMY;  Surgeon: Clyde Canterbury, MD;  Location: [place];  Service: ENT;  Laterality: Bilateral;  . PARTIAL MASTECTOMY WITH NEEDLE LOCALIZATION Left 10/16/2016   Procedure: PARTIAL MASTECTOMY WITH NEEDLE LOCALIZATION;  Surgeon: Leonie Green, MD;  Location: [facility];  Service: General;  Laterality: Left;  . SENTINEL NODE BIOPSY Left 10/16/2016   Procedure: SENTINEL NODE BIOPSY;  Surgeon: Leonie Green, MD;  Location: [facility];  Service: General;  Laterality: Left;  . SPHENOIDECTOMY Left 01/17/2019   Procedure: SPHENOIDOTOMY;  Surgeon: Clyde Canterbury, MD;  Location: [place];  Service: ENT;  Laterality: Left;  . WRIST FRACTURE SURGERY Right     SOCIAL HISTORY: Social History   Socioeconomic History  . Marital status: Married    Spouse name: Not on file  . Number of children: Not on file  . Years of education: Not on file  . Highest education level: Not on file  Occupational History  . Not on file  Tobacco Use  . Smoking status: Never Smoker  . Smokeless tobacco: Never Used  Vaping Use  . Vaping Use: Never used  Substance and Sexual Activity  . Alcohol use: Yes    Alcohol/week: 6.0 standard drinks    Types: 6 Cans of beer per week    Comment: BEER OCC  . Drug use: No  . Sexual activity: Not on file  Other Topics Concern  . Not on file  Social History Narrative   Married.   1 child.    Works as  a Optometrist.   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   Social Determinants of Health   Financial Resource Strain: Not on file  Food Insecurity: Not on file  Transportation Needs: Not on file  Physical Activity: Not on file  Stress: Not on file  Social Connections: Not on file  Intimate Partner Violence: Not on file    FAMILY HISTORY Family History  Problem Relation Age of Onset  . Heart disease Father     ALLERGIES:  is allergic to sulfa antibiotics.  MEDICATIONS:  Current Outpatient Medications  Medication Sig Dispense Refill  . acetaminophen (TYLENOL) 500 MG tablet Take 1,000 mg by mouth every 8 (eight) hours as needed for mild pain or moderate pain.    Marland Kitchen atorvastatin (LIPITOR) 10 MG tablet TAKE 1 TABLET BY MOUTH ONCE A DAY FOR CHOLESTEROL 90 tablet 1  . Cholecalciferol (D3 VITAMIN PO) Take by mouth.    . diclofenac Sodium (VOLTAREN) 1 % GEL Apply 4 g topically 4 (four) times daily. 500 g 5  . fluticasone (FLONASE) 50 MCG/ACT nasal spray Place 1 spray into both nostrils 2 (two) times daily. 16 g 0  . letrozole (FEMARA) 2.5 MG tablet Take 1 tablet (2.5 mg  total) by mouth daily. 90 tablet 1  . levocetirizine (XYZAL) 5 MG tablet TAKE 1 TABLET BY MOUTH ONCE EVERY EVENING FOR ALLERGIES 90 tablet 2  . levothyroxine (SYNTHROID) 88 MCG tablet TAKE 1 TAB BY MOUTH ONCE DAILY. TAKE ON AN EMPTY STOMACH WITH A GLASS OF WATER ATLEAST 30-60 MINUTES BEFORE BREAKFAST 90 tablet 1  . Multiple Minerals-Vitamins (CALCIUM & VIT D3 BONE HEALTH PO) Take 1 capsule by mouth daily.    Marland Kitchen omeprazole (PRILOSEC) 10 MG capsule Take 10 mg by mouth daily.    . TURMERIC PO Take 1 tablet by mouth daily.    . vitamin B-12 (CYANOCOBALAMIN) 500 MCG tablet Take 1 tablet (500 mcg total) by mouth daily. 90 tablet 1   No current facility-administered medications for this visit.    PHYSICAL EXAMINATION:  ECOG PERFORMANCE STATUS: 0 - Asymptomatic   Vitals:    05/27/20 1007  BP: (!) 167/88  Pulse: 73  Resp: 20  Temp: 98 F (36.7 C)    Filed Weights   05/27/20 1007  Weight: 150 lb 14.4 oz (68.4 kg)     Physical Exam Constitutional:      General: She is not in acute distress.    Appearance: She is not diaphoretic.  HENT:     Head: Normocephalic and atraumatic.     Nose: Nose normal.     Mouth/Throat:     Pharynx: No oropharyngeal exudate.  Eyes:     General: No scleral icterus.    Pupils: Pupils are equal, round, and reactive to light.  Cardiovascular:     Rate and Rhythm: Normal rate and regular rhythm.     Heart sounds: No murmur heard.   Pulmonary:     Effort: Pulmonary effort is normal. No respiratory distress.     Breath sounds: No rales.  Chest:     Chest wall: No tenderness.  Abdominal:     General: There is no distension.     Palpations: Abdomen is soft.     Tenderness: There is no abdominal tenderness.  Musculoskeletal:        General: Normal range of motion.     Cervical back: Normal range of motion and neck supple.  Skin:    General: Skin is warm and dry.     Findings: No erythema.  Neurological:     Mental Status: She is alert and oriented to person, place, and time.     Cranial Nerves: No cranial nerve deficit.     Motor: No abnormal muscle tone.     Coordination: Coordination normal.  Psychiatric:        Mood and Affect: Affect normal.   Breast exam was performed in seated and lying down position. Patient is status post lumpectomy left breast with a well-healed surgical scar, with local fibrotic changes at the surgical site. No evidence of any palpable masses. No evidence of axillary adenopathy. She has focal radiation induced hyperpigmentation and also breast edema.             RADIOGRAPHIC STUDIES: I have personally reviewed the radiological images as listed and agree with the findings in the report No results found.   ASSESSMENT/PLAN Cancer Staging Ductal carcinoma in situ (DCIS) of left  breast Staging form: Breast, AJCC 8th Edition - Clinical stage from 09/21/2016: Stage 0 (cTis (DCIS), cN0, cM0, ER: Positive, PR: Positive, HER2: Negative) - Signed by Earlie Server, MD on 10/22/2016 Method of lymph node assessment: Clinical Nuclear grade: G2 Laterality: Left - Pathologic stage  from 10/16/2016: Stage 0 (pTis (DCIS), pN0(sn), cM0, ER: Positive, PR: Positive, HER2: Negative) - Signed by Earlie Server, MD on 10/22/2016 Method of lymph node assessment: Sentinel lymph node biopsy Nuclear grade: G2 National guidelines used in treatment planning: Yes Type of national guideline used in treatment planning: NCCN  .70 yo postmenapausal female with history of DCIS s/p lumpectomy presents to follow up on management of DCIS.   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 Osteoporosis without current pathological fracture, unspecified osteoporosis type   3. Aromatase inhibitor use   4. Normocytic anemia    # DCIS,Continue letrozole 2.5 mg daily to complete total of 5 years.   Labs are reviewed and discussed with patient.  Continue letrozole 2.5 mg daily. She is due for diagnostic bilateral mammogram in June/July 2022.  She usually gets the mammogram scheduled through GYN clinic.  # Anemia, hemoglobin has improved.  Hemoglobin 11.4.  Vitamin B12 level is 400 08.  Continue vitamin B12 supplementation 500 MCG daily.  # Osteoporosis, continue vitamin D and calcium supplementation.   She is aware about pathological fracture risk. Declined bisphosphonate treatments. Patient is due for repeat DEXA scan and patient prefers to defer   All questions were answered. The patient knows to call the clinic with any problems, questions or concerns. Follow up in  6 months.   Earlie Server, MD, PhD Hematology Oncology [facility] at [facility] Pager- [phone]  05/27/2020

## 2020-05-28 ENCOUNTER — Other Ambulatory Visit: Payer: Self-pay | Admitting: Family Medicine

## 2020-05-28 NOTE — Telephone Encounter (Signed)
Last office visit 04/18/2020 for acute right knee pain with Dr. Lorelei Pont.  Last refilled 05/22/2019 for 500 g with 5 refills.  No future appointments with PCP.

## 2020-05-28 NOTE — Telephone Encounter (Signed)
If not approved, she will need to get OTC

## 2020-07-08 ENCOUNTER — Telehealth: Payer: Self-pay

## 2020-07-08 NOTE — Telephone Encounter (Signed)
Noted appreciate triage support

## 2020-07-08 NOTE — Telephone Encounter (Signed)
FYI  Pt reports URI Sx x2-3 weeks... nasal congestion, cough, some wheezing yellow green nasal/chest congestion... denies fever, chills-- advised to go to UC as I could hear her wheezing and the length of time she has been having Sx she needs to seen in person to be evaluated... pt is agreeable to going to UC--she states she is going to [facility] in [place]

## 2020-07-09 DIAGNOSIS — T7840XA Allergy, unspecified, initial encounter: Secondary | ICD-10-CM | POA: Diagnosis not present

## 2020-07-22 ENCOUNTER — Other Ambulatory Visit: Payer: Self-pay | Admitting: Primary Care

## 2020-07-22 DIAGNOSIS — E039 Hypothyroidism, unspecified: Secondary | ICD-10-CM

## 2020-07-23 NOTE — Telephone Encounter (Signed)
Appointment made for 08/06/2020 at 7:40

## 2020-07-23 NOTE — Telephone Encounter (Signed)
Office visit required for further refills. I have not seen her since April 2021 for CPE. She MUST be scheduled for further refills.

## 2020-08-06 ENCOUNTER — Encounter: Payer: Self-pay | Admitting: Primary Care

## 2020-08-06 ENCOUNTER — Ambulatory Visit (INDEPENDENT_AMBULATORY_CARE_PROVIDER_SITE_OTHER): Payer: Medicare PPO | Admitting: Primary Care

## 2020-08-06 ENCOUNTER — Other Ambulatory Visit: Payer: Self-pay

## 2020-08-06 VITALS — BP 124/72 | HR 73 | Temp 98.6°F | Ht 62.0 in | Wt 146.0 lb

## 2020-08-06 DIAGNOSIS — D0512 Intraductal carcinoma in situ of left breast: Secondary | ICD-10-CM

## 2020-08-06 DIAGNOSIS — Z0000 Encounter for general adult medical examination without abnormal findings: Secondary | ICD-10-CM

## 2020-08-06 DIAGNOSIS — Z23 Encounter for immunization: Secondary | ICD-10-CM

## 2020-08-06 DIAGNOSIS — K219 Gastro-esophageal reflux disease without esophagitis: Secondary | ICD-10-CM | POA: Diagnosis not present

## 2020-08-06 DIAGNOSIS — M810 Age-related osteoporosis without current pathological fracture: Secondary | ICD-10-CM

## 2020-08-06 DIAGNOSIS — J309 Allergic rhinitis, unspecified: Secondary | ICD-10-CM

## 2020-08-06 DIAGNOSIS — R7303 Prediabetes: Secondary | ICD-10-CM

## 2020-08-06 DIAGNOSIS — E785 Hyperlipidemia, unspecified: Secondary | ICD-10-CM | POA: Diagnosis not present

## 2020-08-06 DIAGNOSIS — E039 Hypothyroidism, unspecified: Secondary | ICD-10-CM

## 2020-08-06 DIAGNOSIS — J329 Chronic sinusitis, unspecified: Secondary | ICD-10-CM | POA: Diagnosis not present

## 2020-08-06 LAB — LIPID PANEL
Cholesterol: 191 mg/dL (ref 0–200)
HDL: 52.5 mg/dL (ref >39.00)
NonHDL: 138.03
Total CHOL/HDL Ratio: 4
Triglycerides: 323 mg/dL — ABNORMAL HIGH (ref 0.0–149.0)
VLDL: 64.6 mg/dL — ABNORMAL HIGH (ref 0.0–40.0)

## 2020-08-06 LAB — LDL CHOLESTEROL, DIRECT: Direct LDL: 89 mg/dL

## 2020-08-06 LAB — TSH: TSH: 2 u[IU]/mL (ref 0.35–4.50)

## 2020-08-06 LAB — HEMOGLOBIN A1C: Hgb A1c MFr Bld: 6.5 % (ref 4.6–6.5)

## 2020-08-06 MED ORDER — AZELASTINE HCL 0.1 % NA SOLN: 1.0000 | Freq: Two times a day (BID) | NASAL | 0 refills | Status: AC

## 2020-08-06 NOTE — Assessment & Plan Note (Signed)
Doing well on omeprazole 20 mg, couldn't wean off completely. Continue same.

## 2020-08-06 NOTE — Assessment & Plan Note (Signed)
Overall improved since sinus surgery a few years, continue to monitor. See allergy notes.

## 2020-08-06 NOTE — Progress Notes (Signed)
Subjective:    Patient ID: Mallory Leblanc, female    DOB: 02-10-1951, 70 y.o.   MRN: [id number]  HPI  Mallory Leblanc is a very pleasant 70 y.o. female who presents today for complete physical.  Immunizations: -Influenza: Completed this last season  -Covid-19: Completed 2 vaccines -Shingles: Shingrix completed -Pneumonia: Never completed    Diet: Fair diet.  Exercise: No regular exercise.  Eye exam: Completes annually  Dental exam: Completes semi-annually   Mammogram: July 2021 Dexa: 2019 Colonoscopy: Negative stool card, due in June/July 2022  BP Readings from Last 3 Encounters:  08/06/20 124/72  05/27/20 (!) 167/88  04/18/20 140/82        Review of Systems  Constitutional: Negative for unexpected weight change.  HENT: Negative for rhinorrhea.   Respiratory: Negative for cough and shortness of breath.   Cardiovascular: Negative for chest pain.  Gastrointestinal: Negative for constipation and diarrhea.  Genitourinary: Negative for difficulty urinating.  Musculoskeletal: Negative for arthralgias and myalgias.  Skin: Negative for rash.  Allergic/Immunologic: Positive for environmental allergies.  Neurological: Negative for dizziness and headaches.  Psychiatric/Behavioral: The patient is not nervous/anxious.          Past Medical History:  Diagnosis Date  . Anemia   . Arthritis    LEFT KNEE  . Breast cancer ([employer]) 2018   Left Breast Cancer- DCIS  . Genital warts   . GERD (gastroesophageal reflux disease)    OCC  . Hyperlipidemia   . Hypothyroidism   . Personal history of radiation therapy 2018   F/U left breast cancer    Social History   Socioeconomic History  . Marital status: Married    Spouse name: Not on file  . Number of children: Not on file  . Years of education: Not on file  . Highest education level: Not on file  Occupational History  . Not on file  Tobacco Use  . Smoking status: Never Smoker  . Smokeless tobacco: Never Used  Vaping  Use  . Vaping Use: Never used  Substance and Sexual Activity  . Alcohol use: Yes    Alcohol/week: 6.0 standard drinks    Types: 6 Cans of beer per week    Comment: BEER OCC  . Drug use: No  . Sexual activity: Not on file  Other Topics Concern  . Not on file  Social History Narrative   Married.   1 child.    Works as a Optometrist.   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   Social Determinants of Health   Financial Resource Strain: Not on file  Food Insecurity: Not on file  Transportation Needs: Not on file  Physical Activity: Not on file  Stress: Not on file  Social Connections: Not on file  Intimate Partner Violence: Not on file    Past Surgical History:  Procedure Laterality Date  . APPENDECTOMY    . BREAST BIOPSY Left 09/21/2016   DCIS  . BREAST LUMPECTOMY Left 2018   DCIS  . cyst     on the left ovary early 80's  . ETHMOIDECTOMY Bilateral 01/17/2019   Procedure: ETHMOIDECTOMY;  Surgeon: Clyde Canterbury, MD;  Location: [place];  Service: ENT;  Laterality: Bilateral;  . FRONTAL SINUS EXPLORATION Bilateral 01/17/2019   Procedure: FRONTAL SINUS EXPLORATION;  Surgeon: Clyde Canterbury, MD;  Location: [place];  Service: ENT;  Laterality: Bilateral;  . IMAGE GUIDED SINUS SURGERY N/A 01/17/2019   Procedure: IMAGE GUIDED  SINUS SURGERY;  Surgeon: Clyde Canterbury, MD;  Location: [place];  Service: ENT;  Laterality: N/A;  NEED STRYKER DISK put disk on or charge nurse desk 10-30  kp gave 2nd disk to [facility] 11-4 kp  . MAXILLARY ANTROSTOMY Bilateral 01/17/2019   Procedure: MAXILLARY ANTROSTOMY;  Surgeon: Clyde Canterbury, MD;  Location: [place];  Service: ENT;  Laterality: Bilateral;  . PARTIAL MASTECTOMY WITH NEEDLE LOCALIZATION Left 10/16/2016   Procedure: PARTIAL MASTECTOMY WITH NEEDLE LOCALIZATION;  Surgeon: Leonie Green, MD;  Location: [facility];  Service: General;  Laterality: Left;  .  SENTINEL NODE BIOPSY Left 10/16/2016   Procedure: SENTINEL NODE BIOPSY;  Surgeon: Leonie Green, MD;  Location: [facility];  Service: General;  Laterality: Left;  . SPHENOIDECTOMY Left 01/17/2019   Procedure: SPHENOIDOTOMY;  Surgeon: Clyde Canterbury, MD;  Location: [place];  Service: ENT;  Laterality: Left;  . WRIST FRACTURE SURGERY Right     Family History  Problem Relation Age of Onset  . Heart disease Father     Allergies  Allergen Reactions  . Sulfa Antibiotics Rash    Current Outpatient Medications on File Prior to Visit  Medication Sig Dispense Refill  . acetaminophen (TYLENOL) 500 MG tablet Take 1,000 mg by mouth every 8 (eight) hours as needed for mild pain or moderate pain.    Marland Kitchen atorvastatin (LIPITOR) 10 MG tablet TAKE 1 TABLET BY MOUTH ONCE A DAY FOR CHOLESTEROL 90 tablet 1  . Cholecalciferol (D3 VITAMIN PO) Take by mouth.    . diclofenac Sodium (VOLTAREN) 1 % GEL APPLY 4 GRAMS TOPICALLY 4 TIMES DAILY 500 g 5  . letrozole (FEMARA) 2.5 MG tablet Take 1 tablet (2.5 mg total) by mouth daily. 90 tablet 1  . levothyroxine (SYNTHROID) 88 MCG tablet TAKE 1 TAB BY MOUTH ONCE DAILY. TAKE ON AN EMPTY STOMACH WITH A GLASS OF WATER ATLEAST 30-60 MINUTES BEFORE BREAKFAST 30 tablet 0  . Multiple Minerals-Vitamins (CALCIUM & VIT D3 BONE HEALTH PO) Take 1 capsule by mouth daily.    Marland Kitchen omeprazole (PRILOSEC) 10 MG capsule Take 10 mg by mouth daily.    . vitamin B-12 (CYANOCOBALAMIN) 500 MCG tablet Take 1 tablet (500 mcg total) by mouth daily. 90 tablet 1   No current facility-administered medications on file prior to visit.    BP 124/72   Pulse 73   Temp 98.6 F (37 C) (Temporal)   Ht 5\' 2"  (1.575 m)   Wt 146 lb (66.2 kg)   SpO2 97%   BMI 26.70 kg/m  Objective:   Physical Exam HENT:     Right Ear: Tympanic membrane and ear canal normal.     Left Ear: Tympanic membrane and ear canal normal.     Nose: Nose normal.  Eyes:     Conjunctiva/sclera: Conjunctivae normal.      Pupils: Pupils are equal, round, and reactive to light.  Neck:     Thyroid: No thyromegaly.  Cardiovascular:     Rate and Rhythm: Normal rate and regular rhythm.     Heart sounds: No murmur heard.   Pulmonary:     Effort: Pulmonary effort is normal.     Breath sounds: Normal breath sounds. No rales.  Abdominal:     General: Bowel sounds are normal.     Palpations: Abdomen is soft.     Tenderness: There is no abdominal tenderness.  Musculoskeletal:        General: Normal range of motion.  Cervical back: Neck supple.  Lymphadenopathy:     Cervical: No cervical adenopathy.  Skin:    General: Skin is warm and dry.     Findings: No rash.  Neurological:     Mental Status: She is alert and oriented to person, place, and time.     Cranial Nerves: No cranial nerve deficit.     Deep Tendon Reflexes: Reflexes are normal and symmetric.  Psychiatric:        Mood and Affect: Mood normal.           Assessment & Plan:      This visit occurred during the SARS-CoV-2 public health emergency.  Safety protocols were in place, including screening questions prior to the visit, additional usage of staff PPE, and extensive cleaning of exam room while observing appropriate contact time as indicated for disinfecting solutions.

## 2020-08-06 NOTE — Assessment & Plan Note (Signed)
Prevnar 20 mg due, provided today. Other vaccines UTD. Bone density scan and mammogram due this Summer, she is aware. GYN orders.   Completes hemoccult stool cards annually per GYN.  Discussed the importance of a healthy diet and regular exercise in order for weight loss, and to reduce the risk of any potential medical problems.  Exam today stable. Labs pending.

## 2020-08-06 NOTE — Patient Instructions (Signed)
Stop by the lab prior to leaving today. I will notify you of your results once received.   Continue Nasacort nasal spray, add Astelin nasal spray twice daily as discussed.  It was a pleasure to see you today!   Preventive Care 90 Years and Older, Female Preventive care refers to lifestyle choices and visits with your health care provider that can promote health and wellness. This includes:  A yearly physical exam. This is also called an annual wellness visit.  Regular dental and eye exams.  Immunizations.  Screening for certain conditions.  Healthy lifestyle choices, such as: ? Eating a healthy diet. ? Getting regular exercise. ? Not using drugs or products that contain nicotine and tobacco. ? Limiting alcohol use. What can I expect for my preventive care visit? Physical exam Your health care provider will check your:  Height and weight. These may be used to calculate your BMI (body mass index). BMI is a measurement that tells if you are at a healthy weight.  Heart rate and blood pressure.  Body temperature.  Skin for abnormal spots. Counseling Your health care provider may ask you questions about your:  Past medical problems.  Family's medical history.  Alcohol, tobacco, and drug use.  Emotional well-being.  Home life and relationship well-being.  Sexual activity.  Diet, exercise, and sleep habits.  History of falls.  Memory and ability to understand (cognition).  Work and work Statistician.  Pregnancy and menstrual history.  Access to firearms. What immunizations do I need? Vaccines are usually given at various ages, according to a schedule. Your health care provider will recommend vaccines for you based on your age, medical history, and lifestyle or other factors, such as travel or where you work.   What tests do I need? Blood tests  Lipid and cholesterol levels. These may be checked every 5 years, or more often depending on your overall  health.  Hepatitis C test.  Hepatitis B test. Screening  Lung cancer screening. You may have this screening every year starting at age 81 if you have a 30-pack-year history of smoking and currently smoke or have quit within the past 15 years.  Colorectal cancer screening. ? All adults should have this screening starting at age 58 and continuing until age 84. ? Your health care provider may recommend screening at age 24 if you are at increased risk. ? You will have tests every 1-10 years, depending on your results and the type of screening test.  Diabetes screening. ? This is done by checking your blood sugar (glucose) after you have not eaten for a while (fasting). ? You may have this done every 1-3 years.  Mammogram. ? This may be done every 1-2 years. ? Talk with your health care provider about how often you should have regular mammograms.  Abdominal aortic aneurysm (AAA) screening. You may need this if you are a current or former smoker.  BRCA-related cancer screening. This may be done if you have a family history of breast, ovarian, tubal, or peritoneal cancers. Other tests  STD (sexually transmitted disease) testing, if you are at risk.  Bone density scan. This is done to screen for osteoporosis. You may have this done starting at age 9. Talk with your health care provider about your test results, treatment options, and if necessary, the need for more tests. Follow these instructions at home: Eating and drinking  Eat a diet that includes fresh fruits and vegetables, whole grains, lean protein, and low-fat dairy products. Limit  your intake of foods with high amounts of sugar, saturated fats, and salt.  Take vitamin and mineral supplements as recommended by your health care provider.  Do not drink alcohol if your health care provider tells you not to drink.  If you drink alcohol: ? Limit how much you have to 0-1 drink a day. ? Be aware of how much alcohol is in your  drink. In the [place] one drink equals one 12 oz bottle of beer (355 mL), one 5 oz glass of wine (148 mL), or one 1 oz glass of hard liquor (44 mL).   Lifestyle  Take daily care of your teeth and gums. Brush your teeth every morning and night with fluoride toothpaste. Floss one time each day.  Stay active. Exercise for at least 30 minutes 5 or more days each week.  Do not use any products that contain nicotine or tobacco, such as cigarettes, e-cigarettes, and chewing tobacco. If you need help quitting, ask your health care provider.  Do not use drugs.  If you are sexually active, practice safe sex. Use a condom or other form of protection in order to prevent STIs (sexually transmitted infections).  Talk with your health care provider about taking a low-dose aspirin or statin.  Find healthy ways to cope with stress, such as: ? Meditation, yoga, or listening to music. ? Journaling. ? Talking to a trusted person. ? Spending time with friends and family. Safety  Always wear your seat belt while driving or riding in a vehicle.  Do not drive: ? If you have been drinking alcohol. Do not ride with someone who has been drinking. ? When you are tired or distracted. ? While texting.  Wear a helmet and other protective equipment during sports activities.  If you have firearms in your house, make sure you follow all gun safety procedures. What's next?  Visit your health care provider once a year for an annual wellness visit.  Ask your health care provider how often you should have your eyes and teeth checked.  Stay up to date on all vaccines. This information is not intended to replace advice given to you by your health care provider. Make sure you discuss any questions you have with your health care provider. Document Revised: 02/14/2020 Document Reviewed: 02/17/2018 Elsevier Patient Education  2021 [employer].

## 2020-08-06 NOTE — Assessment & Plan Note (Signed)
Bone density scan due this year, sees GYN who orders. Continue calcium and vitamin D, Femara.

## 2020-08-06 NOTE — Assessment & Plan Note (Signed)
Compliant to Femara 2.5 mg, continue same. Follows with oncology. Mammogram due this Summer, she is aware.

## 2020-08-06 NOTE — Assessment & Plan Note (Signed)
Discussed the importance of a healthy diet and regular exercise in order for weight loss, and to reduce the risk of any potential medical problems.  Repeat A1C pending. 

## 2020-08-06 NOTE — Assessment & Plan Note (Signed)
Compliant to atorvastatin 10 mg, continue same. Repeat lipid panel pending.

## 2020-08-06 NOTE — Assessment & Plan Note (Signed)
She seems to be taking levothyroxine 88 mcg correctly, continue same. Repeat TSH pending.

## 2020-08-06 NOTE — Assessment & Plan Note (Signed)
Chronic and ongoing despite Nasacort and Claritin.  Will add in Astelin nasal spray, she will update.

## 2020-08-08 NOTE — Addendum Note (Signed)
Addended by: Francella Solian on: 08/08/2020 07:31 AM   Modules accepted: Orders

## 2020-08-13 ENCOUNTER — Other Ambulatory Visit: Payer: Self-pay

## 2020-08-13 ENCOUNTER — Ambulatory Visit: Payer: Medicare PPO | Admitting: Family Medicine

## 2020-08-13 VITALS — BP 142/78 | HR 77 | Temp 98.6°F | Ht 62.0 in | Wt 146.5 lb

## 2020-08-13 DIAGNOSIS — R1011 Right upper quadrant pain: Secondary | ICD-10-CM | POA: Insufficient documentation

## 2020-08-13 NOTE — Patient Instructions (Signed)
#   chest discomfort - increase Omeprazole to 20 mg daily - if improving can continue for 4-6 weeks  Monitor for worsening symptom or pain with activity

## 2020-08-13 NOTE — Assessment & Plan Note (Signed)
New onset occasional pain which lasts for 5 minutes. Etiology unclear - possible stress vs gastritis. Murphy sign negative and no food association. Considered cardiac but no exertional component and does not radiate. Discussed trial of omeprazole 20 mg (up from 10 mg). Encouraged de-stressing activities (parent in the hospital). Continued monitoring for worsening, frequency or exertion for more cardiac concern. At this point no additional tests with reassuring exam and stress related triggers.

## 2020-08-13 NOTE — Progress Notes (Signed)
Subjective:     Mallory Leblanc is a 70 y.o. female presenting for Pain (Under R breast, on and off, x 1 week )     HPI  #Breast pain - dad is in the hospital x 3 weeks - not sure if this may be stress - pain under the right breast - feels like indigestion - but no association with food - was riding the lawn mower on Saturday and it happened - pneumonia shot last week - had a bad reaction and was having breathing difficulty for a few days - pain is coming every 2 days  - only started 1 week ago - lasts less than 5 minutes - will rub or apply pressure or try to relax and symptoms resolve - this morning - occurred with stressful thinking and resting in bed - also occurred last week when she was having the breathing difficulty and improved with rest and removing the mask  bp well controlled at home 115/60s    Review of Systems  Respiratory: Negative for cough and shortness of breath.   Cardiovascular: Positive for chest pain. Negative for palpitations and leg swelling.  Gastrointestinal: Negative for abdominal pain, constipation, diarrhea, nausea and vomiting.       Reflux   Neurological: Negative for light-headedness and headaches.     Social History   Tobacco Use  Smoking Status Never Smoker  Smokeless Tobacco Never Used        Objective:    BP Readings from Last 3 Encounters:  08/13/20 (!) 142/78  08/06/20 124/72  05/27/20 (!) 167/88   Wt Readings from Last 3 Encounters:  08/13/20 146 lb 8 oz (66.5 kg)  08/06/20 146 lb (66.2 kg)  05/27/20 150 lb 14.4 oz (68.4 kg)    BP (!) 142/78   Pulse 77   Temp 98.6 F (37 C) (Temporal)   Ht 5\' 2"  (1.575 m)   Wt 146 lb 8 oz (66.5 kg)   SpO2 97%   BMI 26.80 kg/m    Physical Exam Constitutional:      General: She is not in acute distress.    Appearance: She is well-developed. She is not diaphoretic.  HENT:     Right Ear: External ear normal.     Left Ear: External ear normal.     Nose: Nose normal.   Eyes:     Conjunctiva/sclera: Conjunctivae normal.  Cardiovascular:     Rate and Rhythm: Normal rate and regular rhythm.     Heart sounds: No murmur heard.   Pulmonary:     Effort: Pulmonary effort is normal. No respiratory distress.     Breath sounds: Normal breath sounds. No wheezing.  Chest:     Chest wall: No mass, tenderness or crepitus.  Abdominal:     General: Abdomen is flat. Bowel sounds are normal. There is no distension.     Palpations: Abdomen is soft.     Tenderness: There is no abdominal tenderness. There is no guarding or rebound. Negative signs include Murphy's sign.  Musculoskeletal:     Cervical back: Neck supple.  Skin:    General: Skin is warm and dry.     Capillary Refill: Capillary refill takes less than 2 seconds.  Neurological:     Mental Status: She is alert. Mental status is at baseline.  Psychiatric:        Mood and Affect: Mood normal.        Behavior: Behavior normal.  Assessment & Plan:   Problem List Items Addressed This Visit      Other   Right upper quadrant abdominal pain - Primary    New onset occasional pain which lasts for 5 minutes. Etiology unclear - possible stress vs gastritis. Murphy sign negative and no food association. Considered cardiac but no exertional component and does not radiate. Discussed trial of omeprazole 20 mg (up from 10 mg). Encouraged de-stressing activities (parent in the hospital). Continued monitoring for worsening, frequency or exertion for more cardiac concern. At this point no additional tests with reassuring exam and stress related triggers.           Return if symptoms worsen or fail to improve.  Lesleigh Noe, MD  This visit occurred during the SARS-CoV-2 public health emergency.  Safety protocols were in place, including screening questions prior to the visit, additional usage of staff PPE, and extensive cleaning of exam room while observing appropriate contact time as indicated for  disinfecting solutions.

## 2020-09-02 ENCOUNTER — Other Ambulatory Visit: Payer: Self-pay | Admitting: Primary Care

## 2020-09-02 DIAGNOSIS — E785 Hyperlipidemia, unspecified: Secondary | ICD-10-CM

## 2020-09-16 ENCOUNTER — Other Ambulatory Visit: Payer: Self-pay | Admitting: Obstetrics and Gynecology

## 2020-09-16 DIAGNOSIS — Z853 Personal history of malignant neoplasm of breast: Secondary | ICD-10-CM

## 2020-09-25 ENCOUNTER — Ambulatory Visit
Admission: RE | Admit: 2020-09-25 | Discharge: 2020-09-25 | Disposition: A | Discharge status: Home / Self Care | Payer: Medicare PPO | Source: Ambulatory Visit | Attending: Obstetrics and Gynecology | Admitting: Obstetrics and Gynecology

## 2020-09-25 ENCOUNTER — Other Ambulatory Visit: Payer: Self-pay

## 2020-09-25 DIAGNOSIS — Z853 Personal history of malignant neoplasm of breast: Secondary | ICD-10-CM

## 2020-09-25 DIAGNOSIS — R922 Inconclusive mammogram: Secondary | ICD-10-CM | POA: Diagnosis not present

## 2020-10-11 DIAGNOSIS — Z1211 Encounter for screening for malignant neoplasm of colon: Secondary | ICD-10-CM | POA: Diagnosis not present

## 2020-11-25 ENCOUNTER — Other Ambulatory Visit: Payer: Self-pay | Admitting: Oncology

## 2020-11-27 ENCOUNTER — Inpatient Hospital Stay: Payer: Medicare PPO | Attending: Oncology

## 2020-11-27 ENCOUNTER — Encounter: Payer: Self-pay | Admitting: Oncology

## 2020-11-27 ENCOUNTER — Inpatient Hospital Stay: Payer: Medicare PPO | Admitting: Oncology

## 2020-11-27 ENCOUNTER — Other Ambulatory Visit: Payer: Self-pay

## 2020-11-27 VITALS — BP 149/81 | HR 70 | Temp 99.0°F | Wt 144.0 lb

## 2020-11-27 DIAGNOSIS — Z79899 Other long term (current) drug therapy: Secondary | ICD-10-CM | POA: Diagnosis not present

## 2020-11-27 DIAGNOSIS — D649 Anemia, unspecified: Secondary | ICD-10-CM | POA: Diagnosis not present

## 2020-11-27 DIAGNOSIS — D0512 Intraductal carcinoma in situ of left breast: Secondary | ICD-10-CM

## 2020-11-27 DIAGNOSIS — M810 Age-related osteoporosis without current pathological fracture: Secondary | ICD-10-CM | POA: Insufficient documentation

## 2020-11-27 DIAGNOSIS — Z79811 Long term (current) use of aromatase inhibitors: Secondary | ICD-10-CM

## 2020-11-27 LAB — IRON AND TIBC
Iron: 83 ug/dL (ref 28–170)
Saturation Ratios: 19 % (ref 10.4–31.8)
TIBC: 441 ug/dL (ref 250–450)
UIBC: 358 ug/dL

## 2020-11-27 LAB — COMPREHENSIVE METABOLIC PANEL
ALT: 21 U/L (ref 0–44)
AST: 24 U/L (ref 15–41)
Albumin: 4.8 g/dL (ref 3.5–5.0)
Alkaline Phosphatase: 86 U/L (ref 38–126)
Anion gap: 10 (ref 5–15)
BUN: 12 mg/dL (ref 8–23)
CO2: 25 mmol/L (ref 22–32)
Calcium: 9.8 mg/dL (ref 8.9–10.3)
Chloride: 103 mmol/L (ref 98–111)
Creatinine, Ser: 0.73 mg/dL (ref 0.44–1.00)
GFR, Estimated: >60 mL/min (ref >60)
Glucose, Bld: 106 mg/dL — ABNORMAL HIGH (ref 70–99)
Potassium: 3.9 mmol/L (ref 3.5–5.1)
Sodium: 138 mmol/L (ref 135–145)
Total Bilirubin: 0.8 mg/dL (ref 0.3–1.2)
Total Protein: 7.8 g/dL (ref 6.5–8.1)

## 2020-11-27 LAB — CBC WITH DIFFERENTIAL/PLATELET
Abs Immature Granulocytes: 0.02 10*3/uL (ref 0.00–0.07)
Basophils Absolute: 0.1 10*3/uL (ref 0.0–0.1)
Basophils Relative: 2 %
Eosinophils Absolute: 0.6 10*3/uL — ABNORMAL HIGH (ref 0.0–0.5)
Eosinophils Relative: 9 %
HCT: 33.5 % — ABNORMAL LOW (ref 36.0–46.0)
Hemoglobin: 10.9 g/dL — ABNORMAL LOW (ref 12.0–15.0)
Immature Granulocytes: 0 %
Lymphocytes Relative: 37 %
Lymphs Abs: 2.6 10*3/uL (ref 0.7–4.0)
MCH: 30.4 pg (ref 26.0–34.0)
MCHC: 32.5 g/dL (ref 30.0–36.0)
MCV: 93.3 fL (ref 80.0–100.0)
Monocytes Absolute: 0.7 10*3/uL (ref 0.1–1.0)
Monocytes Relative: 9 %
Neutro Abs: 3.1 10*3/uL (ref 1.7–7.7)
Neutrophils Relative %: 43 %
Platelets: 194 10*3/uL (ref 150–400)
RBC: 3.59 MIL/uL — ABNORMAL LOW (ref 3.87–5.11)
RDW: 13.6 % (ref 11.5–15.5)
WBC: 7.2 10*3/uL (ref 4.0–10.5)
nRBC: 0 % (ref 0.0–0.2)

## 2020-11-27 LAB — FERRITIN: Ferritin: 70 ng/mL (ref 11–307)

## 2020-11-27 LAB — FOLATE: Folate: 16.8 ng/mL (ref >5.9)

## 2020-11-27 NOTE — Progress Notes (Signed)
Patient here for oncology follow-up appointment, concerns of sinus drainage

## 2020-11-27 NOTE — Progress Notes (Signed)
[facility] follow up visit Patient Care Team: Pleas Koch, NP as PCP - General (Internal Medicine)  REASON FOR VISIT Follow up for treatment of DCIS  HISTORY OF PRESENTING ILLNESS: Mallory Leblanc 70 y.o. 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 Patient had screening mammogram done on 08/21/2016 with [facility] system which revealed grouped calcification that approximately 1 cm in the upper outer quadrant of the left breast 9 cm from nipple which are indicated to terminate a prominent left axillary lymph node is present and is unchanged when compared to the ultrasound of 13 and 2711 studies no other dermatitis is seen in the left breast there are no suspicious masses malignant calcifications site of architecture distortion or concerning asymmetries in the right breast. Patient had diagnostic mammogram unilateral left down on September 09 2006, followed by rest left upper outer quadrant stereotactic biopsy. Pathology showed DCIS intermediate grade, calcifications associated with DCIS. DCIS is present in 4 out of 6 blocks with the largest focus measuring 6 mm. ER more than 90% positive. PR 50-90% positive.  Post lumpectomy pathology showed: DCIS status post lumpectomy. Superior margin is negative but close. 2 mm margin is recommended as it is associated with reduced risk of ipsilateral tumor recurrence. Case was discussed at breast tumor conference and consensus was patient to undergo radiation followed by adjuvant endocrine therapy for 5 years.   She is postmenopausal. Started on Letrozole 2.$RemoveBefore'5mg'[id number]$  daily since April 2019.   # 08/27/2017 Diagnostic Mammogram showed benign findings. Will need diagnostic mammogram in 1 year.  INTERVAL HISTORY Patient with oncology history listed above reviewed by me today presents for follow up for follow up for DCIS.  Has been taking  letrozole 2.5 mg daily, since April 2019.  She tolerates well.  Reports chronic sinusitis. She has ENT appt for evaluation.   no new complaints.   Review of Systems  Constitutional:  Negative for appetite change, chills, diaphoresis, fatigue and fever.  HENT:   Negative for hearing loss, lump/mass, nosebleeds and voice change.        Sinusitis  Eyes:  Negative for eye problems.  Respiratory:  Negative for chest tightness, cough, hemoptysis and shortness of breath.   Cardiovascular:  Negative for chest pain and leg swelling.  Gastrointestinal:  Negative for abdominal distention, abdominal pain, blood in stool, constipation and diarrhea.  Endocrine: Positive for hot flashes.  Genitourinary:  Negative for bladder incontinence, difficulty urinating, frequency and hematuria.   Musculoskeletal:  Negative for arthralgias, back pain, flank pain and gait problem.  Skin:  Negative for itching and rash.  Neurological:  Negative for dizziness, extremity weakness, gait problem, headaches and numbness.  Hematological:  Negative for adenopathy. Does not bruise/bleed easily.  Psychiatric/Behavioral:  Negative for confusion, decreased concentration and sleep disturbance. The patient is not nervous/anxious.    MEDICAL HISTORY: Past Medical History:  Diagnosis Date   Anemia    Arthritis    LEFT KNEE   Breast cancer ([place]) 2018   Left Breast Cancer- DCIS   Genital warts    GERD (gastroesophageal reflux disease)    OCC   Hyperlipidemia    Hypothyroidism    Personal history of radiation therapy 2018   F/U left breast cancer    SURGICAL HISTORY: Past Surgical History:  Procedure Laterality Date   APPENDECTOMY     BREAST BIOPSY Left 09/21/2016   DCIS   BREAST  LUMPECTOMY Left 2018   DCIS   cyst     on the left ovary early 80's   ETHMOIDECTOMY Bilateral 01/17/2019   Procedure: ETHMOIDECTOMY;  Surgeon: Clyde Canterbury, MD;  Location: [facility];  Service: ENT;  Laterality: Bilateral;    FRONTAL SINUS EXPLORATION Bilateral 01/17/2019   Procedure: FRONTAL SINUS EXPLORATION;  Surgeon: Clyde Canterbury, MD;  Location: [facility];  Service: ENT;  Laterality: Bilateral;   IMAGE GUIDED SINUS SURGERY N/A 01/17/2019   Procedure: IMAGE GUIDED SINUS SURGERY;  Surgeon: Clyde Canterbury, MD;  Location: [facility];  Service: ENT;  Laterality: N/A;  NEED STRYKER DISK put disk on or charge nurse desk 10-30  kp gave 2nd disk to Manuela Schwartz 11-4 kp   MAXILLARY ANTROSTOMY Bilateral 01/17/2019   Procedure: MAXILLARY ANTROSTOMY;  Surgeon: Clyde Canterbury, MD;  Location: [facility];  Service: ENT;  Laterality: Bilateral;   PARTIAL MASTECTOMY WITH NEEDLE LOCALIZATION Left 10/16/2016   Procedure: PARTIAL MASTECTOMY WITH NEEDLE LOCALIZATION;  Surgeon: Leonie Green, MD;  Location: [facility];  Service: General;  Laterality: Left;   SENTINEL NODE BIOPSY Left 10/16/2016   Procedure: SENTINEL NODE BIOPSY;  Surgeon: Leonie Green, MD;  Location: [facility];  Service: General;  Laterality: Left;   SPHENOIDECTOMY Left 01/17/2019   Procedure: SPHENOIDOTOMY;  Surgeon: Clyde Canterbury, MD;  Location: [facility];  Service: ENT;  Laterality: Left;   WRIST FRACTURE SURGERY Right     SOCIAL HISTORY: Social History   Socioeconomic History   Marital status: Married    Spouse name: Not on file   Number of children: Not on file   Years of education: Not on file   Highest education level: Not on file  Occupational History   Not on file  Tobacco Use   Smoking status: Never   Smokeless tobacco: Never  Vaping Use   Vaping Use: Never used  Substance and Sexual Activity   Alcohol use: Yes    Alcohol/week: 6.0 standard drinks    Types: 6 Cans of beer per week    Comment: BEER OCC   Drug use: No   Sexual activity: Not on file  Other Topics Concern   Not on file  Social History Narrative   Married.   1 child.    Works as a Optometrist.   Enjoys riding her motorcycle,  walking her dog, traveling to the mountains.   Social Determinants of Health   Financial Resource Strain: Not on file  Food Insecurity: Not on file  Transportation Needs: Not on file  Physical Activity: Not on file  Stress: Not on file  Social Connections: Not on file  Intimate Partner Violence: Not on file    FAMILY HISTORY Family History  Problem Relation Age of Onset   Heart disease Father     ALLERGIES:  is allergic to sulfa antibiotics.  MEDICATIONS:  Current Outpatient Medications  Medication Sig Dispense Refill   acetaminophen (TYLENOL) 500 MG tablet Take 1,000 mg by mouth every 8 (eight) hours as needed for mild pain or moderate pain.     atorvastatin (LIPITOR) 10 MG tablet TAKE 1 TABLET BY MOUTH ONCE A DAY FOR CHOLESTEROL 90 tablet 3   azelastine (ASTELIN) 0.1 % nasal spray Place 1 spray into both nostrils 2 (two) times daily. Use in each nostril as directed 30 mL 0   Cholecalciferol (D3 VITAMIN PO) Take by mouth.     diclofenac Sodium (VOLTAREN) 1 % GEL APPLY 4 GRAMS TOPICALLY 4 TIMES  DAILY 500 g 5   letrozole (FEMARA) 2.5 MG tablet TAKE 1 TABLET BY MOUTH ONCE A DAY 90 tablet 1   levothyroxine (SYNTHROID) 88 MCG tablet TAKE 1 TAB BY MOUTH ONCE DAILY. TAKE ON AN EMPTY STOMACH WITH A GLASS OF WATER ATLEAST 30-60 MINUTES BEFORE BREAKFAST 30 tablet 0   loratadine (CLARITIN) 5 MG chewable tablet Chew 5 mg by mouth daily.     Multiple Minerals-Vitamins (CALCIUM & VIT D3 BONE HEALTH PO) Take 1 capsule by mouth daily.     omeprazole (PRILOSEC) 10 MG capsule Take 10 mg by mouth daily.     vitamin B-12 (CYANOCOBALAMIN) 500 MCG tablet Take 1 tablet (500 mcg total) by mouth daily. 90 tablet 1   No current facility-administered medications for this visit.    PHYSICAL EXAMINATION:  ECOG PERFORMANCE STATUS: 0 - Asymptomatic   Vitals:   11/27/20 1030  BP: (!) 149/81  Pulse: 70  Temp: 99 F (37.2 C)  SpO2: 98%    Filed Weights   11/27/20 1030  Weight: 144 lb (65.3 kg)      Physical Exam Constitutional:      General: She is not in acute distress.    Appearance: She is not diaphoretic.  HENT:     Head: Normocephalic and atraumatic.     Nose: Nose normal.     Mouth/Throat:     Pharynx: No oropharyngeal exudate.  Eyes:     General: No scleral icterus.    Pupils: Pupils are equal, round, and reactive to light.  Cardiovascular:     Rate and Rhythm: Normal rate and regular rhythm.     Heart sounds: No murmur heard. Pulmonary:     Effort: Pulmonary effort is normal. No respiratory distress.     Breath sounds: No rales.  Chest:     Chest wall: No tenderness.  Abdominal:     General: There is no distension.     Palpations: Abdomen is soft.     Tenderness: There is no abdominal tenderness.  Musculoskeletal:        General: Normal range of motion.     Cervical back: Normal range of motion and neck supple.  Skin:    General: Skin is warm and dry.     Findings: No erythema.  Neurological:     Mental Status: She is alert and oriented to person, place, and time.     Cranial Nerves: No cranial nerve deficit.     Motor: No abnormal muscle tone.     Coordination: Coordination normal.  Psychiatric:        Mood and Affect: Affect normal.              RADIOGRAPHIC STUDIES: I have personally reviewed the radiological images as listed and agree with the findings in the report MM DIAG BREAST TOMO BILATERAL  Result Date: 09/25/2020 CLINICAL DATA:  70 year old female for annual follow-up. History of LEFT breast cancer and lumpectomy in 2018. EXAM: DIGITAL DIAGNOSTIC BILATERAL MAMMOGRAM WITH CAD AND TOMO COMPARISON:  Previous exam(s). ACR Breast Density Category b: There are scattered areas of fibroglandular density. FINDINGS: 2D and 3D full field views of both breasts and a magnification view of the lumpectomy site demonstrate no suspicious mass, nonsurgical distortion or worrisome calcifications. LEFT lumpectomy changes are again noted. Mammographic images  were processed with CAD. IMPRESSION: No evidence of breast malignancy. RECOMMENDATION: Per protocol, as the patient is now 2 or more years status post lumpectomy, she may return to annual screening mammography  in 1 year. However, given the history of breast cancer, the patient remains eligible for annual diagnostic mammography if preferred. I have discussed the findings and recommendations with the patient. If applicable, a reminder letter will be sent to the patient regarding the next appointment. BI-RADS CATEGORY  2: Benign. Electronically Signed   By: Margarette Canada M.D.   On: 09/25/2020 11:19    ASSESSMENT/PLAN Cancer Staging Ductal carcinoma in situ (DCIS) of left breast Staging form: Breast, AJCC 8th Edition - Clinical stage from 09/21/2016: Stage 0 (cTis (DCIS), cN0, cM0, ER+, PR+, HER2-) - Signed by Earlie Server, MD on 10/22/2016 Method of lymph node assessment: Clinical Nuclear grade: G2 Laterality: Left - Pathologic stage from 10/16/2016: Stage 0 (pTis (DCIS), pN0(sn), cM0, ER+, PR+, HER2-) - Signed by Earlie Server, MD on 10/22/2016 Method of lymph node assessment: Sentinel lymph node biopsy Nuclear grade: G2 National guidelines used in treatment planning: Yes Type of national guideline used in treatment planning: NCCN  .70 yo postmenapausal female with history of DCIS s/p lumpectomy presents to follow up on management of DCIS.   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 Osteoporosis without current pathological fracture, unspecified osteoporosis type   3. Aromatase inhibitor use   4. Normocytic anemia    # DCIS,Continue letrozole 2.5 mg daily to complete total of 5 years.   Labs are reviewed and discussed with patient.. continue  letrozole 2.5 mg daily. June 2022 diagnostic bilateral mammogram-ordered by GYN. Results were reviewed.  Recommend annual screening mammogram June 2023  # chronic Anemia, hemoglobin has decreased. Hb 10.9. check iron tibc ferritin folate.  #Vitamin B12  level is 400   Continue vitamin B12 supplementation 500 MCG daily.  # Osteoporosis, continue vitamin D and calcium supplementation.   She is aware about pathological fracture risk. Declined bisphosphonate treatments. Patient is due for repeat DEXA scan and patient prefers to defer   All questions were answered. The patient knows to call the clinic with any problems, questions or concerns. Follow up in  6 months.   Earlie Server, MD, PhD  11/27/2020

## 2020-11-28 ENCOUNTER — Other Ambulatory Visit: Payer: Medicare PPO

## 2020-11-28 ENCOUNTER — Ambulatory Visit: Payer: Medicare PPO | Admitting: Oncology

## 2020-12-10 DIAGNOSIS — J300 Vasomotor rhinitis: Secondary | ICD-10-CM | POA: Diagnosis not present

## 2020-12-10 DIAGNOSIS — J309 Allergic rhinitis, unspecified: Secondary | ICD-10-CM | POA: Diagnosis not present

## 2021-01-03 ENCOUNTER — Other Ambulatory Visit: Payer: Self-pay | Admitting: Primary Care

## 2021-01-03 DIAGNOSIS — E039 Hypothyroidism, unspecified: Secondary | ICD-10-CM

## 2021-01-05 NOTE — Telephone Encounter (Signed)
Will you please call patient? She did not read my message on MyChart.

## 2021-01-06 NOTE — Telephone Encounter (Signed)
Left message to return call to our office.  

## 2021-04-11 IMAGING — MG DIGITAL DIAGNOSTIC BILAT W/ TOMO W/ CAD
6 of 9 series · 6 of 25 positions shown · non-contrast
Comparison: Previous exam(s).

CLINICAL DATA: 69-year-old female with history of left breast
cancer in 2333 status post lumpectomy and radiation. Patient
presents for annual exam. No problems today.

EXAM:
DIGITAL DIAGNOSTIC BILATERAL MAMMOGRAM WITH TOMO AND CAD

[L XCCL]
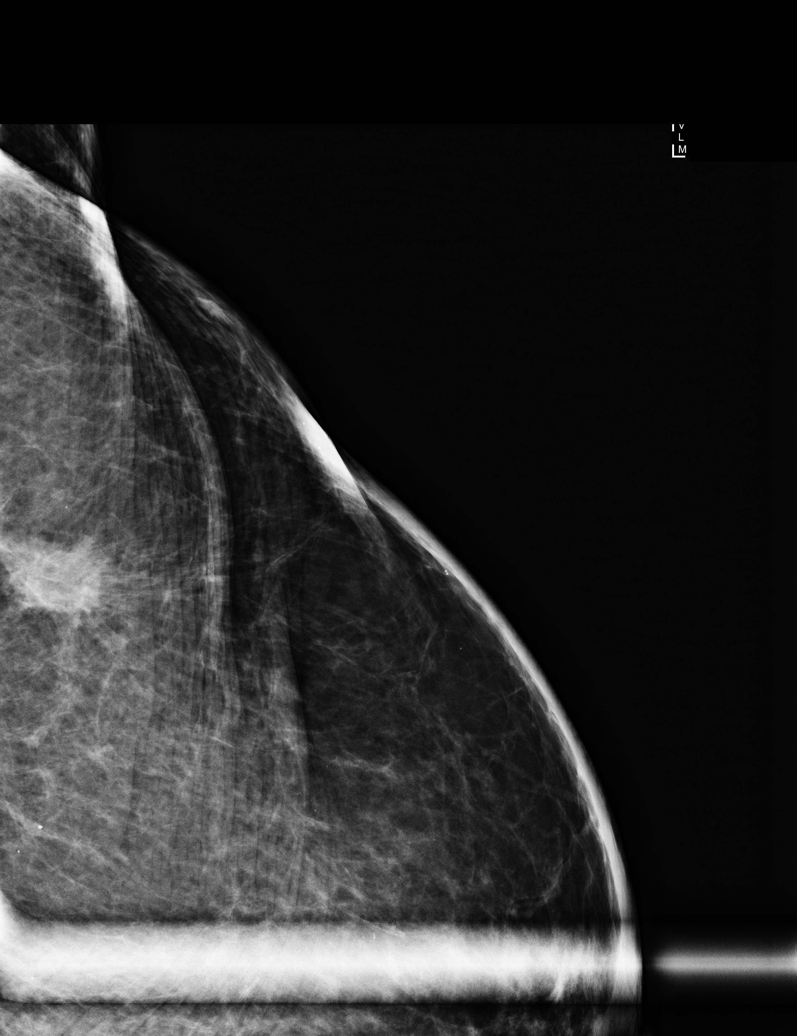

[L CC synth-2D]
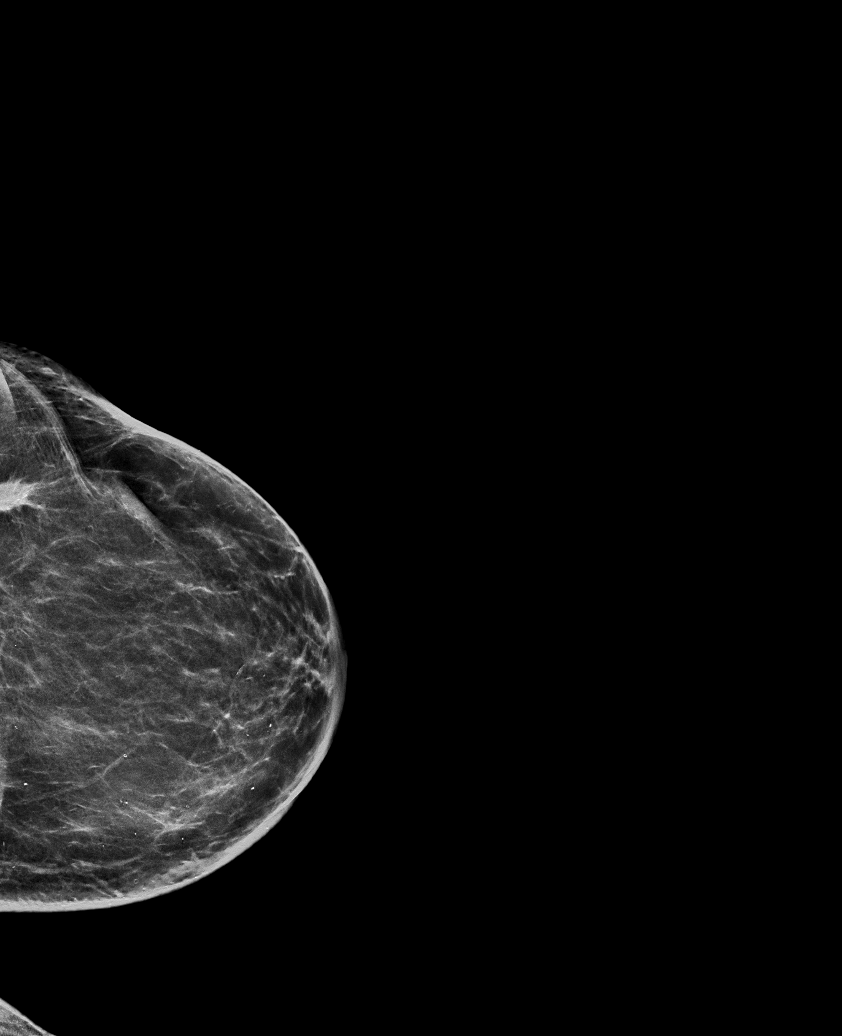

[R CC synth-2D]
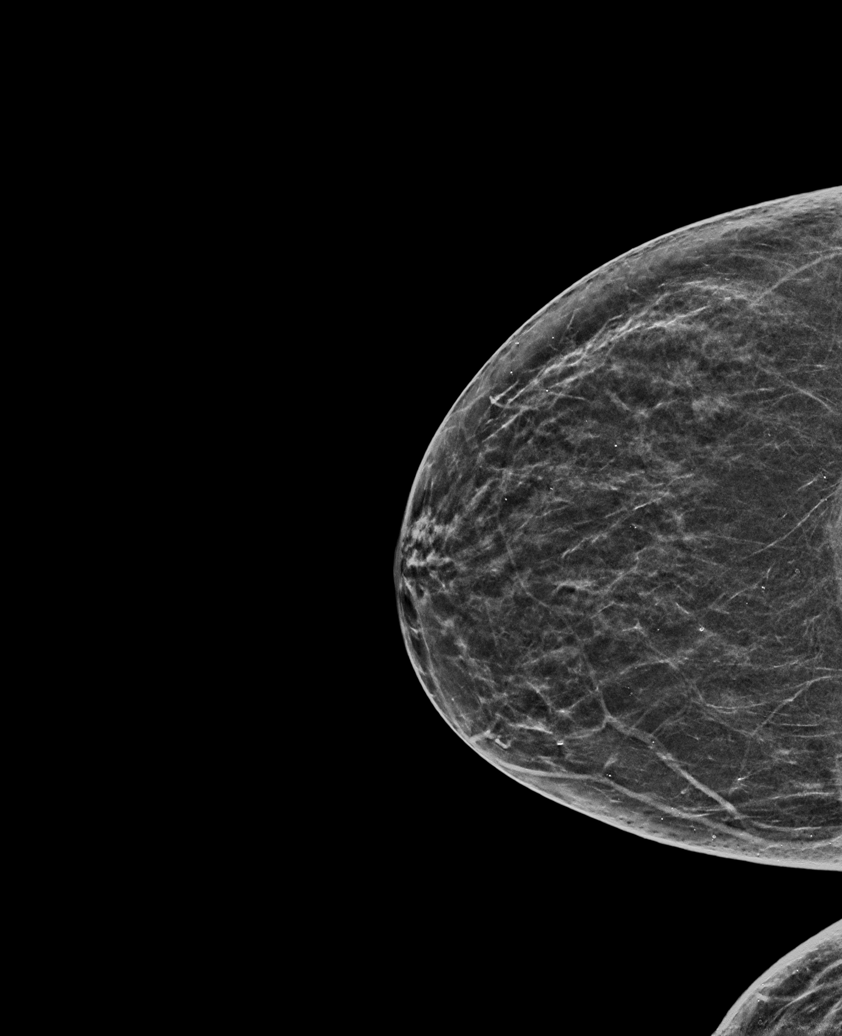

[L MLO synth-2D]
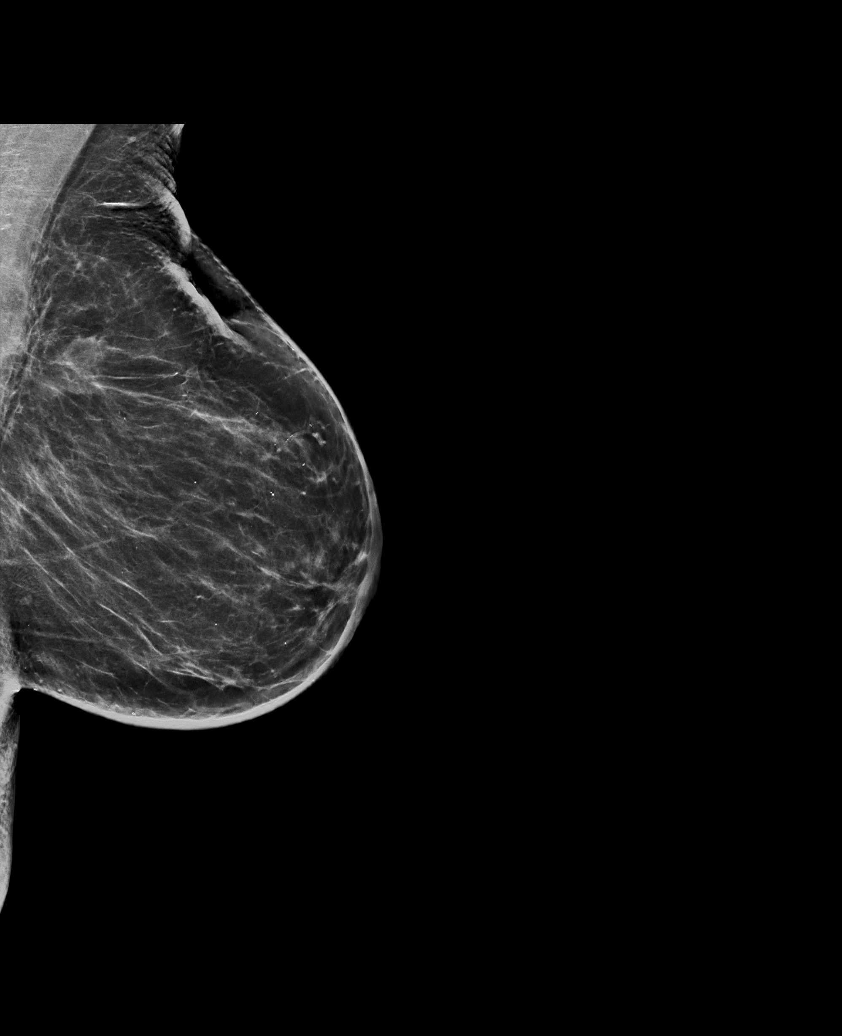

[R MLO synth-2D]
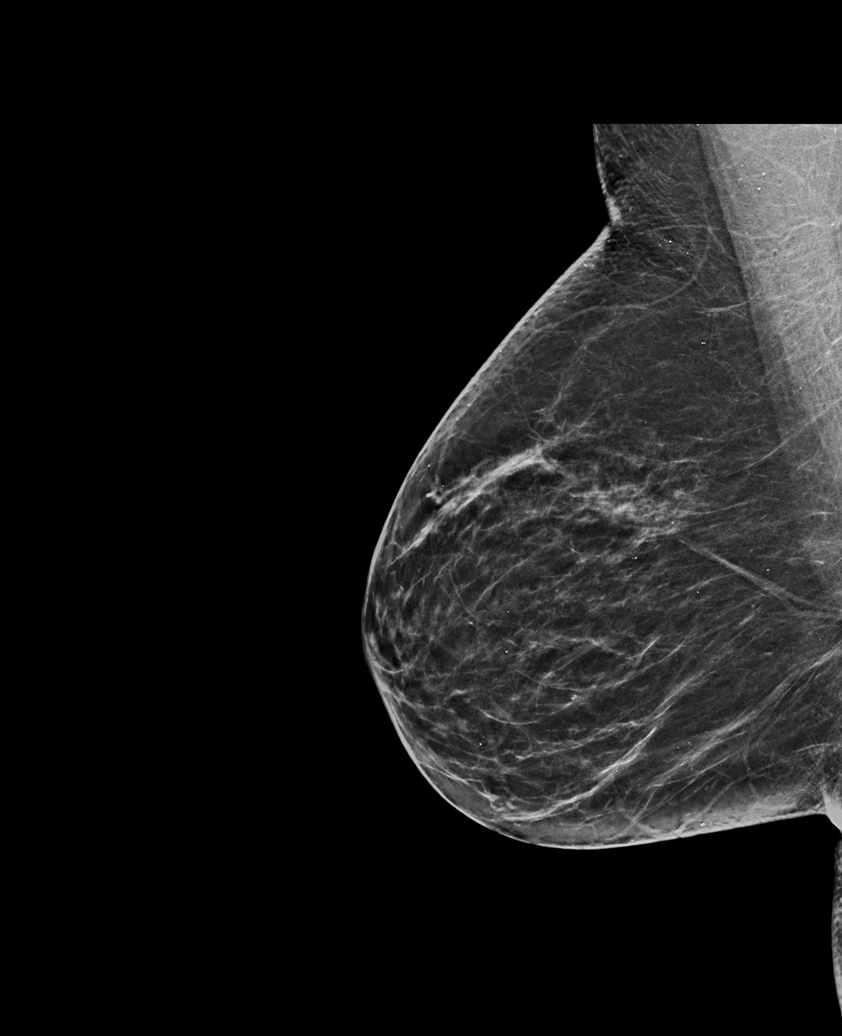

[R CC tomo · tomo slice 25/50.0]
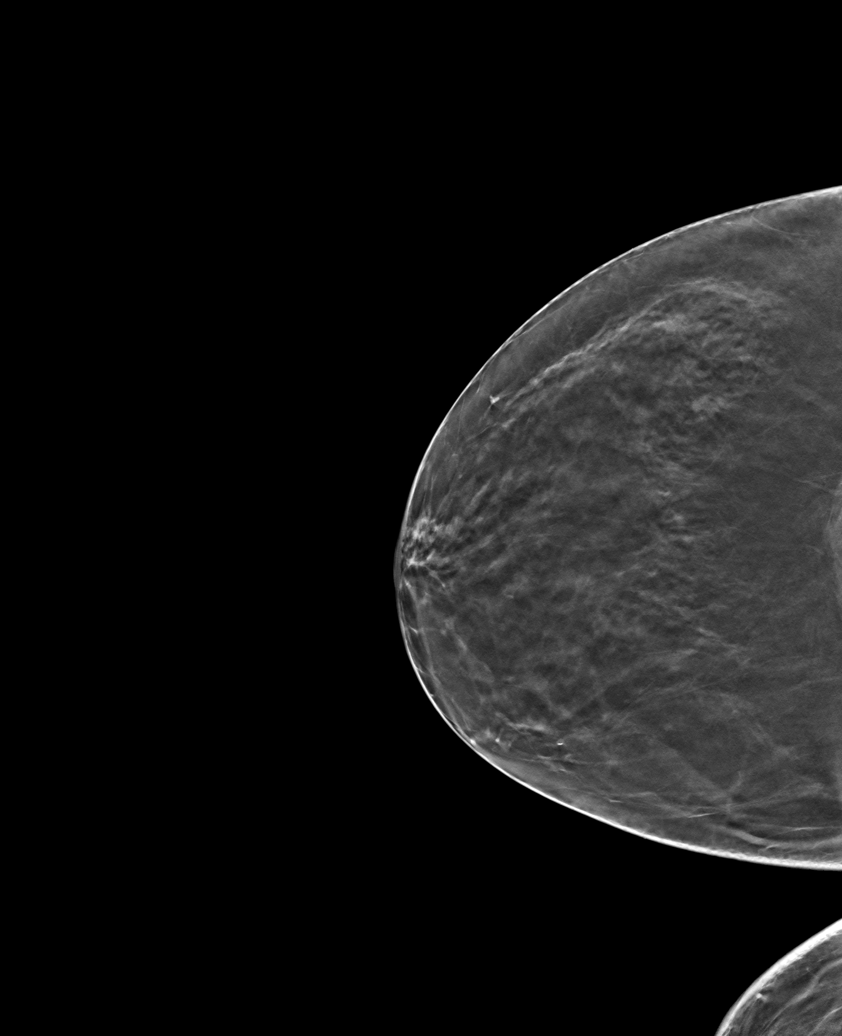

[6 of 25 positions shown; findings below may reference images not displayed]

ACR Breast Density Category b: There are scattered areas of
fibroglandular density.
FINDINGS: Right breast: No suspicious mass, distortion, or microcalcifications
are identified to suggest presence of malignancy.

Left breast: Spot 2D magnification views of the lumpectomy bed were
performed. There are stable postsurgical changes. No suspicious
mass, distortion, or microcalcifications are identified to suggest
presence of malignancy.

Mammographic images were processed with CAD.
IMPRESSION: Postsurgical changes in the left breast. No mammographic evidence of
malignancy bilaterally.

RECOMMENDATION:
Diagnostic mammogram is suggested in 1 year. (Code:HS-K-3OS)

I have discussed the findings and recommendations with the patient.
If applicable, a reminder letter will be sent to the patient
regarding the next appointment.

BI-RADS CATEGORY  2: Benign.

## 2021-05-26 ENCOUNTER — Other Ambulatory Visit: Payer: Self-pay | Admitting: Oncology

## 2021-05-28 ENCOUNTER — Inpatient Hospital Stay: Payer: Medicare PPO | Attending: Oncology

## 2021-05-28 ENCOUNTER — Other Ambulatory Visit: Payer: Self-pay

## 2021-05-28 ENCOUNTER — Inpatient Hospital Stay: Payer: Medicare PPO | Admitting: Oncology

## 2021-05-28 ENCOUNTER — Encounter: Payer: Self-pay | Admitting: Oncology

## 2021-05-28 VITALS — BP 178/85 | HR 67 | Temp 97.6°F | Resp 16 | Wt 150.6 lb

## 2021-05-28 DIAGNOSIS — Z79811 Long term (current) use of aromatase inhibitors: Secondary | ICD-10-CM

## 2021-05-28 DIAGNOSIS — D649 Anemia, unspecified: Secondary | ICD-10-CM | POA: Insufficient documentation

## 2021-05-28 DIAGNOSIS — Z79899 Other long term (current) drug therapy: Secondary | ICD-10-CM | POA: Diagnosis not present

## 2021-05-28 DIAGNOSIS — M810 Age-related osteoporosis without current pathological fracture: Secondary | ICD-10-CM

## 2021-05-28 DIAGNOSIS — D0512 Intraductal carcinoma in situ of left breast: Secondary | ICD-10-CM

## 2021-05-28 DIAGNOSIS — Z923 Personal history of irradiation: Secondary | ICD-10-CM | POA: Insufficient documentation

## 2021-05-28 LAB — COMPREHENSIVE METABOLIC PANEL
ALT: 27 U/L (ref 0–44)
AST: 29 U/L (ref 15–41)
Albumin: 4.8 g/dL (ref 3.5–5.0)
Alkaline Phosphatase: 86 U/L (ref 38–126)
Anion gap: 9 (ref 5–15)
BUN: 15 mg/dL (ref 8–23)
CO2: 27 mmol/L (ref 22–32)
Calcium: 9.8 mg/dL (ref 8.9–10.3)
Chloride: 98 mmol/L (ref 98–111)
Creatinine, Ser: 0.68 mg/dL (ref 0.44–1.00)
GFR, Estimated: >60 mL/min (ref >60)
Glucose, Bld: 109 mg/dL — ABNORMAL HIGH (ref 70–99)
Potassium: 3.8 mmol/L (ref 3.5–5.1)
Sodium: 134 mmol/L — ABNORMAL LOW (ref 135–145)
Total Bilirubin: 0.6 mg/dL (ref 0.3–1.2)
Total Protein: 7.8 g/dL (ref 6.5–8.1)

## 2021-05-28 LAB — CBC
HCT: 35.8 % — ABNORMAL LOW (ref 36.0–46.0)
Hemoglobin: 11.6 g/dL — ABNORMAL LOW (ref 12.0–15.0)
MCH: 30.8 pg (ref 26.0–34.0)
MCHC: 32.4 g/dL (ref 30.0–36.0)
MCV: 95 fL (ref 80.0–100.0)
Platelets: 262 10*3/uL (ref 150–400)
RBC: 3.77 MIL/uL — ABNORMAL LOW (ref 3.87–5.11)
RDW: 13.2 % (ref 11.5–15.5)
WBC: 7.1 10*3/uL (ref 4.0–10.5)
nRBC: 0 % (ref 0.0–0.2)

## 2021-05-28 LAB — VITAMIN B12: Vitamin B-12: 813 pg/mL (ref 180–914)

## 2021-05-28 NOTE — Progress Notes (Addendum)
[facility] follow up visit ?Patient Care Team: ?Pleas Koch, NP as PCP - General (Internal Medicine) ? ?REASON FOR VISIT ?Follow up for treatment of DCIS ? ?HISTORY OF PRESENTING ILLNESS: ?Mallory Leblanc 71 y.o. 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 ?Patient had screening mammogram done on 08/21/2016 with [facility] which revealed grouped calcification that approximately 1 cm in the upper outer quadrant of the left breast 9 cm from nipple which are indicated to terminate a prominent left axillary lymph node is present and is unchanged when compared to the ultrasound of 13 and 2711 studies no other dermatitis is seen in the left breast there are no suspicious masses malignant calcifications site of architecture distortion or concerning asymmetries in the right breast. ?Patient had diagnostic mammogram unilateral left down on September 09 2006, followed by rest left upper outer quadrant stereotactic biopsy. Pathology showed DCIS intermediate grade, calcifications associated with DCIS. DCIS is present in 4 out of 6 blocks with the largest focus measuring 6 mm. ER more than 90% positive. PR 50-90% positive. ? ?Post lumpectomy pathology showed: DCIS status post lumpectomy. Superior margin is negative but close. 2 mm margin is recommended as it is associated with reduced risk of ipsilateral tumor recurrence. ?Case was discussed at breast tumor conference and consensus was patient to undergo radiation followed by adjuvant endocrine therapy for 5 years.  ? ?She is postmenopausal. Started on Letrozole 2.'5mg'$  daily since January 2019.  ? ?# 08/27/2017 Diagnostic Mammogram showed benign findings. Will need diagnostic mammogram in 1 year. ? ?INTERVAL HISTORY ?Patient with oncology history listed above reviewed by me today presents for follow up for follow up for DCIS.  ?Has been taking  letrozole 2.5 mg daily, since Jan 2019.  ?Overall she tolerates letrozole well..  Manageable side effects. ?Patient denies any new complaints. ? ?Review of Systems  ?Constitutional:  Negative for appetite change, chills, diaphoresis, fatigue and fever.  ?HENT:   Negative for hearing loss, lump/mass, nosebleeds and voice change.   ?     Sinusitis  ?Eyes:  Negative for eye problems.  ?Respiratory:  Negative for chest tightness, cough, hemoptysis and shortness of breath.   ?Cardiovascular:  Negative for chest pain and leg swelling.  ?Gastrointestinal:  Negative for abdominal distention, abdominal pain, blood in stool, constipation and diarrhea.  ?Endocrine: Positive for hot flashes.  ?Genitourinary:  Negative for bladder incontinence, difficulty urinating, frequency and hematuria.   ?Musculoskeletal:  Negative for arthralgias, back pain, flank pain and gait problem.  ?Skin:  Negative for itching and rash.  ?Neurological:  Negative for dizziness, extremity weakness, gait problem, headaches and numbness.  ?Hematological:  Negative for adenopathy. Does not bruise/bleed easily.  ?Psychiatric/Behavioral:  Negative for confusion, decreased concentration and sleep disturbance. The patient is not nervous/anxious.   ? ?MEDICAL HISTORY: ?Past Medical History:  ?Diagnosis Date  ? Anemia   ? Arthritis   ? LEFT KNEE  ? Breast cancer ([place]) 2018  ? Left Breast Cancer- DCIS  ? Genital warts   ? GERD (gastroesophageal reflux disease)   ? OCC  ? Hyperlipidemia   ? Hypothyroidism   ? Personal history of radiation therapy 2018  ? F/U left breast cancer  ? ? ?SURGICAL HISTORY: ?Past Surgical History:  ?Procedure Laterality Date  ? APPENDECTOMY    ? BREAST BIOPSY Left 09/21/2016  ? DCIS  ? BREAST LUMPECTOMY Left 2018  ?  DCIS  ? cyst    ? on the left ovary early 80's  ? ETHMOIDECTOMY Bilateral 01/17/2019  ? Procedure: ETHMOIDECTOMY;  Surgeon: Clyde Canterbury, MD;  Location: [place];  Service: ENT;  Laterality: Bilateral;  ? FRONTAL  SINUS EXPLORATION Bilateral 01/17/2019  ? Procedure: FRONTAL SINUS EXPLORATION;  Surgeon: Clyde Canterbury, MD;  Location: [place];  Service: ENT;  Laterality: Bilateral;  ? IMAGE GUIDED SINUS SURGERY N/A 01/17/2019  ? Procedure: IMAGE GUIDED SINUS SURGERY;  Surgeon: Clyde Canterbury, MD;  Location: [place];  Service: ENT;  Laterality: N/A;  NEED STRYKER DISK ?put disk on or charge nurse desk 10-30  kp ?gave 2nd disk to Manuela Schwartz 11-4 kp  ? MAXILLARY ANTROSTOMY Bilateral 01/17/2019  ? Procedure: MAXILLARY ANTROSTOMY;  Surgeon: Clyde Canterbury, MD;  Location: [place];  Service: ENT;  Laterality: Bilateral;  ? PARTIAL MASTECTOMY WITH NEEDLE LOCALIZATION Left 10/16/2016  ? Procedure: PARTIAL MASTECTOMY WITH NEEDLE LOCALIZATION;  Surgeon: Leonie Green, MD;  Location: [facility];  Service: General;  Laterality: Left;  ? SENTINEL NODE BIOPSY Left 10/16/2016  ? Procedure: SENTINEL NODE BIOPSY;  Surgeon: Leonie Green, MD;  Location: [facility];  Service: General;  Laterality: Left;  ? SPHENOIDECTOMY Left 01/17/2019  ? Procedure: SPHENOIDOTOMY;  Surgeon: Clyde Canterbury, MD;  Location: [place];  Service: ENT;  Laterality: Left;  ? WRIST FRACTURE SURGERY Right   ? ? ?SOCIAL HISTORY: ?Social History  ? ?Socioeconomic History  ? Marital status: Married  ?  Spouse name: Not on file  ? Number of children: Not on file  ? Years of education: Not on file  ? Highest education level: Not on file  ?Occupational History  ? Not on file  ?Tobacco Use  ? Smoking status: Never  ? Smokeless tobacco: Never  ?Vaping Use  ? Vaping Use: Never used  ?Substance and Sexual Activity  ? Alcohol use: Yes  ?  Alcohol/week: 6.0 standard drinks  ?  Types: 6 Cans of beer per week  ?  Comment: BEER OCC  ? Drug use: No  ? Sexual activity: Not on file  ?Other Topics Concern  ? Not on file  ?Social History Narrative  ? Married.  ? 1 child.   ? Works as a Optometrist.  ? Enjoys riding her motorcycle, walking  her dog, traveling to the mountains.  ? ?Social Determinants of Health  ? ?Financial Resource Strain: Not on file  ?Food Insecurity: Not on file  ?Transportation Needs: Not on file  ?Physical Activity: Not on file  ?Stress: Not on file  ?Social Connections: Not on file  ?Intimate Partner Violence: Not on file  ? ? ?FAMILY HISTORY ?Family History  ?Problem Relation Age of Onset  ? Heart disease Father   ? ? ?ALLERGIES:  is allergic to sulfa antibiotics. ? ?MEDICATIONS:  ?Current Outpatient Medications  ?Medication Sig Dispense Refill  ? acetaminophen (TYLENOL) 500 MG tablet Take 1,000 mg by mouth every 8 (eight) hours as needed for mild pain or moderate pain.    ? atorvastatin (LIPITOR) 10 MG tablet TAKE 1 TABLET BY MOUTH ONCE A DAY FOR CHOLESTEROL 90 tablet 3  ? azelastine (ASTELIN) 0.1 % nasal spray Place 1 spray into both nostrils 2 (two) times daily. Use in each nostril as directed 30 mL 0  ? Cholecalciferol (D3 VITAMIN PO) Take by mouth.    ? diclofenac Sodium (VOLTAREN) 1 % GEL APPLY 4 GRAMS TOPICALLY 4 TIMES DAILY 500 g 5  ?  letrozole (FEMARA) 2.5 MG tablet TAKE 1 TABLET BY MOUTH ONCE A DAY 90 tablet 0  ? levothyroxine (SYNTHROID) 88 MCG tablet TAKE 1 TAB BY MOUTH ONCE DAILY. TAKE ON AN EMPTY STOMACH WITH A GLASS OF WATER ATLEAST 30-60 MINUTES BEFORE BREAKFAST 90 tablet 1  ? montelukast (SINGULAIR) 10 MG tablet Take by mouth.    ? Multiple Minerals-Vitamins (CALCIUM & VIT D3 BONE HEALTH PO) Take 1 capsule by mouth daily.    ? omeprazole (PRILOSEC) 10 MG capsule Take 10 mg by mouth daily.    ? vitamin B-12 (CYANOCOBALAMIN) 500 MCG tablet Take 1 tablet (500 mcg total) by mouth daily. 90 tablet 1  ? levocetirizine (XYZAL) 5 MG tablet Take by mouth. (Patient not taking: Reported on 05/28/2021)    ? ?No current facility-administered medications for this visit.  ? ? ?PHYSICAL EXAMINATION: ? ?ECOG PERFORMANCE STATUS: 0 - Asymptomatic ? ? ?Vitals:  ? 05/28/21 1014  ?BP: (!) 178/85  ?Pulse: 67  ?Resp: 16  ?Temp: 97.6 ?F  (36.4 ?C)  ?SpO2: 100%  ? ? ?Filed Weights  ? 05/28/21 1014  ?Weight: 150 lb 9.6 oz (68.3 kg)  ? ? ? ?Physical Exam ?Constitutional:   ?   General: She is not in acute distress. ?   Appearance: She is n

## 2021-05-28 NOTE — Progress Notes (Signed)
Pt in for follow up, denies any concerns today. 

## 2021-06-02 LAB — MULTIPLE MYELOMA PANEL, SERUM
Albumin SerPl Elph-Mcnc: 4.3 g/dL (ref 2.9–4.4)
Albumin/Glob SerPl: 1.5 (ref 0.7–1.7)
Alpha 1: 0.2 g/dL (ref 0.0–0.4)
Alpha2 Glob SerPl Elph-Mcnc: 0.7 g/dL (ref 0.4–1.0)
B-Globulin SerPl Elph-Mcnc: 1.1 g/dL (ref 0.7–1.3)
Gamma Glob SerPl Elph-Mcnc: 0.9 g/dL (ref 0.4–1.8)
Globulin, Total: 2.9 g/dL (ref 2.2–3.9)
IgA: 120 mg/dL (ref 87–352)
IgG (Immunoglobin G), Serum: 994 mg/dL (ref 586–1602)
IgM (Immunoglobulin M), Srm: 49 mg/dL (ref 26–217)
Total Protein ELP: 7.2 g/dL (ref 6.0–8.5)

## 2021-06-09 ENCOUNTER — Other Ambulatory Visit: Payer: Self-pay | Admitting: Family Medicine

## 2021-06-10 ENCOUNTER — Encounter: Payer: Self-pay | Admitting: Primary Care

## 2021-06-10 ENCOUNTER — Ambulatory Visit (INDEPENDENT_AMBULATORY_CARE_PROVIDER_SITE_OTHER): Payer: Medicare PPO | Admitting: Primary Care

## 2021-06-10 VITALS — BP 148/80 | HR 77 | Ht 59.5 in | Wt 149.0 lb

## 2021-06-10 DIAGNOSIS — Z853 Personal history of malignant neoplasm of breast: Secondary | ICD-10-CM

## 2021-06-10 DIAGNOSIS — K219 Gastro-esophageal reflux disease without esophagitis: Secondary | ICD-10-CM

## 2021-06-10 DIAGNOSIS — J329 Chronic sinusitis, unspecified: Secondary | ICD-10-CM | POA: Diagnosis not present

## 2021-06-10 DIAGNOSIS — E2839 Other primary ovarian failure: Secondary | ICD-10-CM | POA: Diagnosis not present

## 2021-06-10 DIAGNOSIS — M810 Age-related osteoporosis without current pathological fracture: Secondary | ICD-10-CM

## 2021-06-10 DIAGNOSIS — R7303 Prediabetes: Secondary | ICD-10-CM

## 2021-06-10 DIAGNOSIS — E785 Hyperlipidemia, unspecified: Secondary | ICD-10-CM | POA: Diagnosis not present

## 2021-06-10 DIAGNOSIS — E039 Hypothyroidism, unspecified: Secondary | ICD-10-CM

## 2021-06-10 LAB — LIPID PANEL
Cholesterol: 169 mg/dL (ref 0–200)
HDL: 64.2 mg/dL (ref >39.00)
LDL Cholesterol: 66 mg/dL (ref 0–99)
NonHDL: 104.32
Total CHOL/HDL Ratio: 3
Triglycerides: 192 mg/dL — ABNORMAL HIGH (ref 0.0–149.0)
VLDL: 38.4 mg/dL (ref 0.0–40.0)

## 2021-06-10 LAB — TSH: TSH: 0.6 u[IU]/mL (ref 0.35–5.50)

## 2021-06-10 LAB — HEMOGLOBIN A1C: Hgb A1c MFr Bld: 6.4 % (ref 4.6–6.5)

## 2021-06-10 NOTE — Assessment & Plan Note (Signed)
Improved overall. ? ?Continue Astelin nasal spray daily, Xyzal 5 mg daily, Singulair 10 mg daily. ? ?Following with allergist.  ?

## 2021-06-10 NOTE — Progress Notes (Signed)
? ?Subjective:  ? ? Patient ID: Mallory Leblanc, female    DOB: 09-22-1950, 71 y.o.   MRN: [id number] ? ?HPI ? ?Mallory Leblanc is a very pleasant 71 y.o. female with a history of hypothyroidism, osteoporosis, hyperlipidemia, breast cancer, prediabetes who presents today for follow up of chronic conditions.  ? ?1) History of Breast Cancer: Currently managed on Femara 2.5 mg daily.  Last mammogram was completed in July 2022.  Following with oncology, last visit was in March 2023. She will continue Femara for one additional year, then will be released.  ? ?She is due for repeat bone density scan.  ? ?2) Chronic Sinusitis: Currently managed on Astelin nasal spray, Xyzal 5 mg daily, montelukast 10 mg daily. Evaluated by allergist last Fall 2022, all testing overall negative. History of sinus surgery in 2020, temporary improvement. She is due for follow up later this month.  ? ?Overall feeling better on this regimen.  ? ?3) Hyperlipidemia: Currently managed on atorvastatin 10 mg daily. She is due for repeat lipid panel today. ? ?4) Hypothyroidism: Currently managed on levothyroxine 88 mcg.  She is taking levothyroxine every morning on empty stomach with water only.  She does not eat or take other medications for 30 minutes.  ? ?5) Elevated Blood Pressure: Chronically elevated in doctor's offices per patient.  She monitors her blood pressure at home which runs 130s over 60s or 70s.  She denies chest pain, headaches, dizziness. ? ?BP Readings from Last 3 Encounters:  ?06/10/21 (!) 148/80  ?05/28/21 (!) 178/85  ?11/27/20 (!) 149/81  ? ? ? ? ?Review of Systems  ?Eyes:  Negative for visual disturbance.  ?Respiratory:  Negative for shortness of breath.   ?Cardiovascular:  Negative for chest pain.  ?Neurological:  Negative for dizziness and headaches.  ? ?   ? ? ?Past Medical History:  ?Diagnosis Date  ? Anemia   ? Arthritis   ? LEFT KNEE  ? Breast cancer ([place]) 2018  ? Left Breast Cancer- DCIS  ? Genital warts   ? GERD  (gastroesophageal reflux disease)   ? OCC  ? Hyperlipidemia   ? Hypothyroidism   ? Personal history of radiation therapy 2018  ? F/U left breast cancer  ? ? ?Social History  ? ?Socioeconomic History  ? Marital status: Married  ?  Spouse name: Not on file  ? Number of children: Not on file  ? Years of education: Not on file  ? Highest education level: Not on file  ?Occupational History  ? Not on file  ?Tobacco Use  ? Smoking status: Never  ? Smokeless tobacco: Never  ?Vaping Use  ? Vaping Use: Never used  ?Substance and Sexual Activity  ? Alcohol use: Yes  ?  Alcohol/week: 6.0 standard drinks  ?  Types: 6 Cans of beer per week  ?  Comment: BEER OCC  ? Drug use: No  ? Sexual activity: Not on file  ?Other Topics Concern  ? Not on file  ?Social History Narrative  ? Married.  ? 1 child.   ? Works as a Optometrist.  ? 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  ? ?Social Determinants of Health  ? ?Financial Resource Strain: Not on file  ?Food Insecurity: Not on file  ?Transportation Needs: Not on file  ?Physical Activity: Not on file  ?Stress: Not on file  ?Social Connections: Not on file  ?Intimate Partner Violence: Not on file  ? ? ?Past Surgical  History:  ?Procedure Laterality Date  ? APPENDECTOMY    ? BREAST BIOPSY Left 09/21/2016  ? DCIS  ? BREAST LUMPECTOMY Left 2018  ? DCIS  ? cyst    ? on the left ovary early 80's  ? ETHMOIDECTOMY Bilateral 01/17/2019  ? Procedure: ETHMOIDECTOMY;  Surgeon: Clyde Canterbury, MD;  Location: [place];  Service: ENT;  Laterality: Bilateral;  ? FRONTAL SINUS EXPLORATION Bilateral 01/17/2019  ? Procedure: FRONTAL SINUS EXPLORATION;  Surgeon: Clyde Canterbury, MD;  Location: [place];  Service: ENT;  Laterality: Bilateral;  ? IMAGE GUIDED SINUS SURGERY N/A 01/17/2019  ? Procedure: IMAGE GUIDED SINUS SURGERY;  Surgeon: Clyde Canterbury, MD;  Location: [place];  Service: ENT;  Laterality: N/A;  NEED STRYKER DISK ?put disk on  or charge nurse desk 10-30  kp ?gave 2nd disk to Manuela Schwartz 11-4 kp  ? MAXILLARY ANTROSTOMY Bilateral 01/17/2019  ? Procedure: MAXILLARY ANTROSTOMY;  Surgeon: Clyde Canterbury, MD;  Location: [place];  Service: ENT;  Laterality: Bilateral;  ? PARTIAL MASTECTOMY WITH NEEDLE LOCALIZATION Left 10/16/2016  ? Procedure: PARTIAL MASTECTOMY WITH NEEDLE LOCALIZATION;  Surgeon: Leonie Green, MD;  Location: [facility];  Service: General;  Laterality: Left;  ? SENTINEL NODE BIOPSY Left 10/16/2016  ? Procedure: SENTINEL NODE BIOPSY;  Surgeon: Leonie Green, MD;  Location: [facility];  Service: General;  Laterality: Left;  ? SPHENOIDECTOMY Left 01/17/2019  ? Procedure: SPHENOIDOTOMY;  Surgeon: Clyde Canterbury, MD;  Location: [place];  Service: ENT;  Laterality: Left;  ? WRIST FRACTURE SURGERY Right   ? ? ?Family History  ?Problem Relation Age of Onset  ? Heart disease Father   ? ? ?Allergies  ?Allergen Reactions  ? Sulfa Antibiotics Rash  ? ? ?Current Outpatient Medications on File Prior to Visit  ?Medication Sig Dispense Refill  ? acetaminophen (TYLENOL) 500 MG tablet Take 1,000 mg by mouth every 8 (eight) hours as needed for mild pain or moderate pain.    ? atorvastatin (LIPITOR) 10 MG tablet TAKE 1 TABLET BY MOUTH ONCE A DAY FOR CHOLESTEROL 90 tablet 3  ? azelastine (ASTELIN) 0.1 % nasal spray Place 1 spray into both nostrils 2 (two) times daily. Use in each nostril as directed 30 mL 0  ? Cholecalciferol (D3 VITAMIN PO) Take by mouth.    ? diclofenac Sodium (VOLTAREN) 1 % GEL APPLY 4 GRAMS TOPICALLY 4 TIMES DAILY 500 g 5  ? letrozole (FEMARA) 2.5 MG tablet TAKE 1 TABLET BY MOUTH ONCE A DAY 90 tablet 0  ? levocetirizine (XYZAL) 5 MG tablet Take by mouth.    ? levothyroxine (SYNTHROID) 88 MCG tablet TAKE 1 TAB BY MOUTH ONCE DAILY. TAKE ON AN EMPTY STOMACH WITH A GLASS OF WATER ATLEAST 30-60 MINUTES BEFORE BREAKFAST 90 tablet 1  ? montelukast (SINGULAIR) 10 MG tablet Take by mouth.    ? Multiple  Minerals-Vitamins (CALCIUM & VIT D3 BONE HEALTH PO) Take 1 capsule by mouth daily.    ? omeprazole (PRILOSEC) 10 MG capsule Take 10 mg by mouth daily.    ? vitamin B-12 (CYANOCOBALAMIN) 500 MCG tablet Take 1 tablet (500 mcg total) by mouth daily. 90 tablet 1  ? ?No current facility-administered medications on file prior to visit.  ? ? ?BP (!) 148/80   Pulse 77   Ht 4' 11.5" (1.511 m)   Wt 149 lb (67.6 kg)   SpO2 95%   BMI 29.59 kg/m?  ?Objective:  ? Physical Exam ?Cardiovascular:  ?  Rate and Rhythm: Normal rate and regular rhythm.  ?Pulmonary:  ?   Effort: Pulmonary effort is normal.  ?   Breath sounds: Normal breath sounds.  ?Musculoskeletal:  ?   Cervical back: Neck supple.  ?Skin: ?   General: Skin is warm and dry.  ?Psychiatric:     ?   Mood and Affect: Mood normal.  ? ? ? ? ? ?   ?Assessment & Plan:  ? ? ? ? ?This visit occurred during the SARS-CoV-2 public health emergency.  Safety protocols were in place, including screening questions prior to the visit, additional usage of staff PPE, and extensive cleaning of exam room while observing appropriate contact time as indicated for disinfecting solutions.  ?

## 2021-06-10 NOTE — Assessment & Plan Note (Signed)
Repeat A1c pending. 

## 2021-06-10 NOTE — Assessment & Plan Note (Signed)
Following with oncology, office notes from March 2023 reviewed.  Labs from March 2023 reviewed. ? ?Mammogram up-to-date. ?Continue Femara 2.5 mg daily. ?

## 2021-06-10 NOTE — Assessment & Plan Note (Signed)
Repeat bone density scan due, orders placed and pending. ? ?Continue Femara 2.5 mg daily. ?

## 2021-06-10 NOTE — Assessment & Plan Note (Signed)
Continue atorvastatin 10 mg daily. Repeat lipid panel pending. 

## 2021-06-10 NOTE — Patient Instructions (Signed)
Stop by the lab prior to leaving today. I will notify you of your results once received.   Call the [facility] to schedule your bone density scan.   It was a pleasure to see you today!   

## 2021-06-10 NOTE — Assessment & Plan Note (Signed)
Stable.  Continue omeprazole 20mg daily

## 2021-06-10 NOTE — Assessment & Plan Note (Signed)
She is taking levothyroxine correctly. ? ?Continue levothyroxine 88 mcg. ? ?Repeat TSH pending  ?

## 2021-06-24 DIAGNOSIS — J300 Vasomotor rhinitis: Secondary | ICD-10-CM | POA: Diagnosis not present

## 2021-06-26 ENCOUNTER — Other Ambulatory Visit: Payer: Self-pay | Admitting: Family Medicine

## 2021-06-26 DIAGNOSIS — G8929 Other chronic pain: Secondary | ICD-10-CM

## 2021-06-30 ENCOUNTER — Ambulatory Visit (INDEPENDENT_AMBULATORY_CARE_PROVIDER_SITE_OTHER): Payer: Medicare PPO | Admitting: Family Medicine

## 2021-06-30 VITALS — BP 140/74 | HR 74 | Temp 98.5°F | Resp 16 | Ht 59.0 in | Wt 150.0 lb

## 2021-06-30 DIAGNOSIS — M1712 Unilateral primary osteoarthritis, left knee: Secondary | ICD-10-CM | POA: Diagnosis not present

## 2021-06-30 DIAGNOSIS — G8929 Other chronic pain: Secondary | ICD-10-CM

## 2021-06-30 DIAGNOSIS — M25531 Pain in right wrist: Secondary | ICD-10-CM

## 2021-06-30 MED ORDER — TRIAMCINOLONE ACETONIDE 40 MG/ML IJ SUSP
40.0000 mg | Freq: Once | INTRAMUSCULAR | Status: AC
  Administered 2021-06-30: 40 mg via INTRA_ARTICULAR

## 2021-06-30 NOTE — Progress Notes (Signed)
? ? ?Mykala Mccready T. Tavish Gettis, MD, [facility] ?Therapist, music at [facility] ?[place] ?[place], [address] ? ?Phone: [phone]  FAX: [phone] ? ?Mallory Leblanc - 71 y.o. female  MRN [id number]  Date of Birth: Jun 22, 1950 ? ?Date: 06/30/2021  PCP: Pleas Koch, NP  Referral: Pleas Koch, NP ? ?Chief Complaint  ?Patient presents with  ? Medication Refill  ?  Here for Medication Refill and right wrist pain   ? ? ?This visit occurred during the SARS-CoV-2 public health emergency.  Safety protocols were in place, including screening questions prior to the visit, additional usage of staff PPE, and extensive cleaning of exam room while observing appropriate contact time as indicated for disinfecting solutions.  ? ?Subjective:  ? ?Mallory Leblanc is a 71 y.o. very pleasant female patient with Body mass index is 30.3 kg/m?. who presents with the following: ? ?She is a very nice lady, and I have seen her for multiple issues over the past.  She does have osteoarthritis of both of her knees, and she primarily presents today with some right-sided wrist pain with a flare.  She does have major trauma with operative intervention in the past, but at the time of this encounter this is calm down. ? ?Her primary issue right now is her left-sided knee pain that is been worsened and exacerbated.  She is having some pain with walking and moving as well as going up and down stairs.  This has been an intermittent off-and-on issue for some time.  Right knee is also bothering her some, but the left knee is notably worse. ? ?Review of Systems is noted in the HPI, as appropriate ? ?Objective:  ? ?BP 140/74 (BP Location: Right Arm, Patient Position: Sitting, Cuff Size: Normal)   Pulse 74   Temp 98.5 ?F (36.9 ?C) (Oral)   Resp 16   Ht '4\' 11"'$  (1.499 m)   Wt 150 lb (68 kg)   SpO2 97%   BMI 30.30 kg/m?  ? ?GEN: No acute distress; alert,appropriate. ?PULM: Breathing comfortably in no respiratory  distress ?PSYCH: Normally interactive.  ? ?Left knee: Full extension and flexion to 115.  Minimal effusion.  Stable to varus and valgus stress and PCL and ACL are intact.  She does have pain along the joint lines, medial greater than lateral.  Any kind of forced flexion causes significant pain.  Extension and bounce home do cause some pain, but not nearly as bad.  Strength is roughly 4+/5 about the knee and 4/5 at the hip. ? ?Laboratory and Imaging Data: ? ?Assessment and Plan:  ? ?  ICD-10-CM   ?1. Primary osteoarthritis of left knee  M17.12 triamcinolone acetonide (KENALOG-40) injection 40 mg  ?  ?2. Chronic pain of right wrist  M25.531   ? G89.29   ?  ? ?Right wrist pain is resolved, this will likely be chronic in nature and intermittently bothering her. ? ?Acute on chronic left knee pain with exacerbation.  Left osteoarthritis with exacerbation.  Continue with conservative management at home.  Given functional decrease and failure of other conservative treatments, I do think that a knee injection would likely offer her some significant relief. ? ?Aspiration/Injection Procedure Note ?Mallory Leblanc ?1950-07-26 ?Date of procedure: 06/30/2021 ? ?Procedure: Large Joint Aspiration / Injection of Knee, L ?Indications: Pain ? ?Procedure Details ?Patient verbally consented to procedure. Risks, benefits, and alternatives explained. Sterilely prepped with Chloraprep. Ethyl cholride used for anesthesia. 9 cc Lidocaine  1% mixed with 1 mL of Kenalog 40 mg injected using the anteromedial approach without difficulty. No complications with procedure and tolerated well. Patient had decreased pain post-injection. ?Medication: 1 mL of Kenalog 40 mg  ? ?Voltaren 1% gel, over the counter ?You can apply up to 4 times a day ? ?This can be applied to any joint: knee, wrist, fingers, elbows, shoulders, feet and ankles. ?Can apply to any tendon: tennis elbow, achilles, tendon, rotator cuff or any other tendon. ? ?Minimal is absorbed in the  bloodstream: ok with oral anti-inflammatory or a blood thinner. ? ?Cost is about 9 dollars  ? ?Meds ordered this encounter  ?Medications  ? triamcinolone acetonide (KENALOG-40) injection 40 mg  ? ?There are no discontinued medications. ?No orders of the defined types were placed in this encounter. ? ? ?Follow-up: No follow-ups on file. ? ?[employer] One speech-to-text software was used for transcription in this dictation.  Possible transcriptional errors can occur using Editor, commissioning.  ? ?Signed, ? ?Tyquasia Pant T. Thana Ramp, MD ? ? ?Outpatient Encounter Medications as of 06/30/2021  ?Medication Sig  ? acetaminophen (TYLENOL) 500 MG tablet Take 1,000 mg by mouth every 8 (eight) hours as needed for mild pain or moderate pain.  ? atorvastatin (LIPITOR) 10 MG tablet TAKE 1 TABLET BY MOUTH ONCE A DAY FOR CHOLESTEROL  ? azelastine (ASTELIN) 0.1 % nasal spray Place 1 spray into both nostrils 2 (two) times daily. Use in each nostril as directed  ? Cholecalciferol (D3 VITAMIN PO) Take by mouth.  ? diclofenac Sodium (VOLTAREN) 1 % GEL Apply 2 g topically 3 (three) times daily as needed.  ? letrozole (FEMARA) 2.5 MG tablet TAKE 1 TABLET BY MOUTH ONCE A DAY  ? levocetirizine (XYZAL) 5 MG tablet Take by mouth.  ? levothyroxine (SYNTHROID) 88 MCG tablet TAKE 1 TAB BY MOUTH ONCE DAILY. TAKE ON AN EMPTY STOMACH WITH A GLASS OF WATER ATLEAST 30-60 MINUTES BEFORE BREAKFAST  ? montelukast (SINGULAIR) 10 MG tablet Take by mouth.  ? Multiple Minerals-Vitamins (CALCIUM & VIT D3 BONE HEALTH PO) Take 1 capsule by mouth daily.  ? omeprazole (PRILOSEC) 10 MG capsule Take 10 mg by mouth daily.  ? vitamin B-12 (CYANOCOBALAMIN) 500 MCG tablet Take 1 tablet (500 mcg total) by mouth daily.  ? [EXPIRED] triamcinolone acetonide (KENALOG-40) injection 40 mg   ? ?No facility-administered encounter medications on file as of 06/30/2021.  ?  ?

## 2021-06-30 NOTE — Patient Instructions (Addendum)
Voltaren 1% gel, over the counter ?You can apply up to 4 times a day ? ?This can be applied to any joint: knee, wrist, fingers, elbows, shoulders, feet and ankles. ?Can apply to any tendon: tennis elbow, achilles, tendon, rotator cuff or any other tendon. ? ?Minimal is absorbed in the bloodstream: ok with oral anti-inflammatory or a blood thinner. ? ?Cost is about 9 dollars  ?

## 2021-07-02 ENCOUNTER — Encounter: Payer: Self-pay | Admitting: Family Medicine

## 2021-07-10 ENCOUNTER — Ambulatory Visit
Admission: RE | Admit: 2021-07-10 | Discharge: 2021-07-10 | Disposition: A | Discharge status: Home / Self Care | Payer: Medicare PPO | Source: Ambulatory Visit | Attending: Primary Care | Admitting: Primary Care

## 2021-07-10 DIAGNOSIS — E2839 Other primary ovarian failure: Secondary | ICD-10-CM | POA: Diagnosis not present

## 2021-07-10 DIAGNOSIS — M8588 Other specified disorders of bone density and structure, other site: Secondary | ICD-10-CM | POA: Diagnosis not present

## 2021-07-10 DIAGNOSIS — M810 Age-related osteoporosis without current pathological fracture: Secondary | ICD-10-CM | POA: Diagnosis not present

## 2021-07-29 ENCOUNTER — Other Ambulatory Visit: Payer: Self-pay | Admitting: Primary Care

## 2021-07-29 DIAGNOSIS — E039 Hypothyroidism, unspecified: Secondary | ICD-10-CM

## 2021-08-15 DIAGNOSIS — R519 Headache, unspecified: Secondary | ICD-10-CM | POA: Diagnosis not present

## 2021-08-15 DIAGNOSIS — B029 Zoster without complications: Secondary | ICD-10-CM | POA: Diagnosis not present

## 2021-08-15 DIAGNOSIS — U071 COVID-19: Secondary | ICD-10-CM | POA: Diagnosis not present

## 2021-08-26 ENCOUNTER — Other Ambulatory Visit: Payer: Self-pay | Admitting: Oncology

## 2021-08-30 ENCOUNTER — Other Ambulatory Visit: Payer: Self-pay | Admitting: Oncology

## 2021-09-06 ENCOUNTER — Encounter: Payer: Self-pay | Admitting: Oncology

## 2021-09-07 DIAGNOSIS — L237 Allergic contact dermatitis due to plants, except food: Secondary | ICD-10-CM | POA: Diagnosis not present

## 2021-09-07 DIAGNOSIS — R030 Elevated blood-pressure reading, without diagnosis of hypertension: Secondary | ICD-10-CM | POA: Diagnosis not present

## 2021-09-08 ENCOUNTER — Other Ambulatory Visit: Payer: Self-pay | Admitting: Primary Care

## 2021-09-08 DIAGNOSIS — E785 Hyperlipidemia, unspecified: Secondary | ICD-10-CM

## 2021-09-12 ENCOUNTER — Ambulatory Visit: Payer: Medicare PPO | Admitting: Primary Care

## 2021-09-23 ENCOUNTER — Telehealth: Payer: Self-pay | Admitting: Primary Care

## 2021-09-23 NOTE — Telephone Encounter (Signed)
Left message for patient to call back and schedule Medicare Annual Wellness Visit (AWV) either virtually or phone   awvi 09/07/19 per [facility]    This should be a 45 minute visit.  I left my direct # [phone]

## 2021-09-25 ENCOUNTER — Other Ambulatory Visit: Payer: Self-pay | Admitting: Obstetrics and Gynecology

## 2021-09-25 DIAGNOSIS — Z1231 Encounter for screening mammogram for malignant neoplasm of breast: Secondary | ICD-10-CM

## 2021-09-25 DIAGNOSIS — Z1331 Encounter for screening for depression: Secondary | ICD-10-CM | POA: Diagnosis not present

## 2021-09-25 DIAGNOSIS — Z124 Encounter for screening for malignant neoplasm of cervix: Secondary | ICD-10-CM | POA: Diagnosis not present

## 2021-09-26 ENCOUNTER — Ambulatory Visit (INDEPENDENT_AMBULATORY_CARE_PROVIDER_SITE_OTHER): Payer: Medicare PPO

## 2021-09-26 VITALS — Ht 59.0 in | Wt 144.0 lb

## 2021-09-26 DIAGNOSIS — Z0000 Encounter for general adult medical examination without abnormal findings: Secondary | ICD-10-CM | POA: Diagnosis not present

## 2021-09-26 NOTE — Patient Instructions (Addendum)
Mallory Leblanc , Thank you for taking time to come for your Medicare Wellness Visit. I appreciate your ongoing commitment to your health goals. Please review the following plan we discussed and let me know if I can assist you in the future.   These are the goals we discussed:  Goals       Stay healthy (pt-stated)      Lose weight.        This is a list of the screening recommended for you and due dates:  Health Maintenance  Topic Date Due   COVID-19 Vaccine (4 - Booster for Moderna series) 10/12/2021*   Colon Cancer Screening  06/11/2022*   Tetanus Vaccine  09/27/2022*   Flu Shot  10/07/2021   Mammogram  09/26/2022   Pneumonia Vaccine  Completed   DEXA scan (bone density measurement)  Completed   Hepatitis C Screening: USPSTF Recommendation to screen - Ages 36-79 yo.  Completed   Zoster (Shingles) Vaccine  Completed   HPV Vaccine  Aged Out  *Topic was postponed. The date shown is not the original due date.    Advanced directives: No  Conditions/risks identified: None  Next appointment: Follow up in one year for your annual wellness visit     Preventive Care 65 Years and Older, Female Preventive care refers to lifestyle choices and visits with your health care provider that can promote health and wellness. What does preventive care include? A yearly physical exam. This is also called an annual well check. Dental exams once or twice a year. Routine eye exams. Ask your health care provider how often you should have your eyes checked. Personal lifestyle choices, including: Daily care of your teeth and gums. Regular physical activity. Eating a healthy diet. Avoiding tobacco and drug use. Limiting alcohol use. Practicing safe sex. Taking low-dose aspirin every day. Taking vitamin and mineral supplements as recommended by your health care provider. What happens during an annual well check? The services and screenings done by your health care provider during your annual well  check will depend on your age, overall health, lifestyle risk factors, and family history of disease. Counseling  Your health care provider may ask you questions about your: Alcohol use. Tobacco use. Drug use. Emotional well-being. Home and relationship well-being. Sexual activity. Eating habits. History of falls. Memory and ability to understand (cognition). Work and work Statistician. Reproductive health. Screening  You may have the following tests or measurements: Height, weight, and BMI. Blood pressure. Lipid and cholesterol levels. These may be checked every 5 years, or more frequently if you are over 68 years old. Skin check. Lung cancer screening. You may have this screening every year starting at age 37 if you have a 30-pack-year history of smoking and currently smoke or have quit within the past 15 years. Fecal occult blood test (FOBT) of the stool. You may have this test every year starting at age 54. Flexible sigmoidoscopy or colonoscopy. You may have a sigmoidoscopy every 5 years or a colonoscopy every 10 years starting at age 70. Hepatitis C blood test. Hepatitis B blood test. Sexually transmitted disease (STD) testing. Diabetes screening. This is done by checking your blood sugar (glucose) after you have not eaten for a while (fasting). You may have this done every 1-3 years. Bone density scan. This is done to screen for osteoporosis. You may have this done starting at age 33. Mammogram. This may be done every 1-2 years. Talk to your health care provider about how often you should  have regular mammograms. Talk with your health care provider about your test results, treatment options, and if necessary, the need for more tests. Vaccines  Your health care provider may recommend certain vaccines, such as: Influenza vaccine. This is recommended every year. Tetanus, diphtheria, and acellular pertussis (Tdap, Td) vaccine. You may need a Td booster every 10 years. Zoster  vaccine. You may need this after age 69. Pneumococcal 13-valent conjugate (PCV13) vaccine. One dose is recommended after age 39. Pneumococcal polysaccharide (PPSV23) vaccine. One dose is recommended after age 43. Talk to your health care provider about which screenings and vaccines you need and how often you need them. This information is not intended to replace advice given to you by your health care provider. Make sure you discuss any questions you have with your health care provider. Document Released: 03/22/2015 Document Revised: 11/13/2015 Document Reviewed: 12/25/2014 Elsevier Interactive Patient Education  2017 [place] Prevention in the Home Falls can cause injuries. They can happen to people of all ages. There are many things you can do to make your home safe and to help prevent falls. What can I do on the outside of my home? Regularly fix the edges of walkways and driveways and fix any cracks. Remove anything that might make you trip as you walk through a door, such as a raised step or threshold. Trim any bushes or trees on the path to your home. Use bright outdoor lighting. Clear any walking paths of anything that might make someone trip, such as rocks or tools. Regularly check to see if handrails are loose or broken. Make sure that both sides of any steps have handrails. Any raised decks and porches should have guardrails on the edges. Have any leaves, snow, or ice cleared regularly. Use sand or salt on walking paths during winter. Clean up any spills in your garage right away. This includes oil or grease spills. What can I do in the bathroom? Use night lights. Install grab bars by the toilet and in the tub and shower. Do not use towel bars as grab bars. Use non-skid mats or decals in the tub or shower. If you need to sit down in the shower, use a plastic, non-slip stool. Keep the floor dry. Clean up any water that spills on the floor as soon as it happens. Remove  soap buildup in the tub or shower regularly. Attach bath mats securely with double-sided non-slip rug tape. Do not have throw rugs and other things on the floor that can make you trip. What can I do in the bedroom? Use night lights. Make sure that you have a light by your bed that is easy to reach. Do not use any sheets or blankets that are too big for your bed. They should not hang down onto the floor. Have a firm chair that has side arms. You can use this for support while you get dressed. Do not have throw rugs and other things on the floor that can make you trip. What can I do in the kitchen? Clean up any spills right away. Avoid walking on wet floors. Keep items that you use a lot in easy-to-reach places. If you need to reach something above you, use a strong step stool that has a grab bar. Keep electrical cords out of the way. Do not use floor polish or wax that makes floors slippery. If you must use wax, use non-skid floor wax. Do not have throw rugs and other things on the floor  that can make you trip. What can I do with my stairs? Do not leave any items on the stairs. Make sure that there are handrails on both sides of the stairs and use them. Fix handrails that are broken or loose. Make sure that handrails are as long as the stairways. Check any carpeting to make sure that it is firmly attached to the stairs. Fix any carpet that is loose or worn. Avoid having throw rugs at the top or bottom of the stairs. If you do have throw rugs, attach them to the floor with carpet tape. Make sure that you have a light switch at the top of the stairs and the bottom of the stairs. If you do not have them, ask someone to add them for you. What else can I do to help prevent falls? Wear shoes that: Do not have high heels. Have rubber bottoms. Are comfortable and fit you well. Are closed at the toe. Do not wear sandals. If you use a stepladder: Make sure that it is fully opened. Do not climb a  closed stepladder. Make sure that both sides of the stepladder are locked into place. Ask someone to hold it for you, if possible. Clearly mark and make sure that you can see: Any grab bars or handrails. First and last steps. Where the edge of each step is. Use tools that help you move around (mobility aids) if they are needed. These include: Canes. Walkers. Scooters. Crutches. Turn on the lights when you go into a dark area. Replace any light bulbs as soon as they burn out. Set up your furniture so you have a clear path. Avoid moving your furniture around. If any of your floors are uneven, fix them. If there are any pets around you, be aware of where they are. Review your medicines with your doctor. Some medicines can make you feel dizzy. This can increase your chance of falling. Ask your doctor what other things that you can do to help prevent falls. This information is not intended to replace advice given to you by your health care provider. Make sure you discuss any questions you have with your health care provider. Document Released: 12/20/2008 Document Revised: 08/01/2015 Document Reviewed: 03/30/2014 Elsevier Interactive Patient Education  2017 [employer].

## 2021-09-26 NOTE — Progress Notes (Signed)
Subjective:   Mallory Leblanc is a 71 y.o. female who presents for Medicare Annual (Subsequent) preventive examination.  Review of Systems    Virtual Visit via Telephone Note  I connected with  Mallory Leblanc on 09/26/21 at  1:00 PM EDT by telephone and verified that I am speaking with the correct person using two identifiers.  Location: Patient: Home Provider: Office Persons participating in the virtual visit: patient/Nurse Health Advisor   I discussed the limitations, risks, security and privacy concerns of performing an evaluation and management service by telephone and the availability of in person appointments. The patient expressed understanding and agreed to proceed.  Interactive audio and video telecommunications were attempted between this nurse and patient, however failed, due to patient having technical difficulties OR patient did not have access to video capability.  We continued and completed visit with audio only.  Some vital signs may be absent or patient reported.   [name], LPN  Cardiac Risk Factors include: advanced age (>35mn, >>50women)     Objective:    Today's Vitals   09/26/21 1303  Weight: 144 lb (65.3 kg)  Height: '4\' 11"'$  (1.499 m)   Body mass index is 29.08 kg/m.     09/26/2021    1:16 PM 05/28/2021   10:08 AM 11/27/2020   10:30 AM 11/27/2019   10:31 AM 05/22/2019   10:28 AM 01/17/2019    6:51 AM 11/24/2018   11:19 AM  Advanced Directives  Does Patient Have a Medical Advance Directive? No No No No No No No  Would patient like information on creating a medical advance directive? No - Patient declined  Yes (MAU/Ambulatory/Procedural Areas - Information given) No - Patient declined No - Patient declined Yes (MAU/Ambulatory/Procedural Areas - Information given) No - Patient declined    Current Medications (verified) Outpatient Encounter Medications as of 09/26/2021  Medication Sig   acetaminophen (TYLENOL) 500 MG tablet Take 1,000 mg by mouth  every 8 (eight) hours as needed for mild pain or moderate pain.   atorvastatin (LIPITOR) 10 MG tablet TAKE 1 TABLET BY MOUTH ONCE A DAY FOR CHOLESTEROL   azelastine (ASTELIN) 0.1 % nasal spray Place 1 spray into both nostrils 2 (two) times daily. Use in each nostril as directed   Cholecalciferol (D3 VITAMIN PO) Take by mouth.   diclofenac Sodium (VOLTAREN) 1 % GEL Apply 2 g topically 3 (three) times daily as needed.   GNP VITAMIN B-12 500 MCG tablet TAKE 1 TABLET BY MOUTH ONCE A DAY   letrozole (FEMARA) 2.5 MG tablet TAKE 1 TABLET BY MOUTH ONCE A DAY   levocetirizine (XYZAL) 5 MG tablet Take by mouth.   levothyroxine (SYNTHROID) 88 MCG tablet Take 1 tablet by mouth every morning on an empty stomach with water only.  No food or other medications for 30 minutes.   montelukast (SINGULAIR) 10 MG tablet Take by mouth.   Multiple Minerals-Vitamins (CALCIUM & VIT D3 BONE HEALTH PO) Take 1 capsule by mouth daily.   omeprazole (PRILOSEC) 10 MG capsule Take 10 mg by mouth daily.   No facility-administered encounter medications on file as of 09/26/2021.    Allergies (verified) Sulfa antibiotics   History: Past Medical History:  Diagnosis Date   Anemia    Arthritis    LEFT KNEE   [place]   Left Breast Cancer- DCIS   Genital warts    GERD (gastroesophageal reflux disease)    OCC   Hyperlipidemia  Hypothyroidism    Personal history of radiation therapy 2018   F/U left breast cancer   Past Surgical History:  Procedure Laterality Date   APPENDECTOMY     BREAST BIOPSY Left 09/21/2016   DCIS   BREAST LUMPECTOMY Left 2018   DCIS   cyst     on the left ovary early 80's   ETHMOIDECTOMY Bilateral 01/17/2019   Procedure: ETHMOIDECTOMY;  Surgeon: Clyde Canterbury, MD;  Location: [facility];  Service: ENT;  Laterality: Bilateral;   FRONTAL SINUS EXPLORATION Bilateral 01/17/2019   Procedure: FRONTAL SINUS EXPLORATION;  Surgeon: Clyde Canterbury, MD;  Location: [facility];  Service: ENT;  Laterality: Bilateral;   IMAGE GUIDED SINUS SURGERY N/A 01/17/2019   Procedure: IMAGE GUIDED SINUS SURGERY;  Surgeon: Clyde Canterbury, MD;  Location: [facility];  Service: ENT;  Laterality: N/A;  NEED STRYKER DISK put disk on or charge nurse desk 10-30  kp gave 2nd disk to Manuela Schwartz 11-4 kp   MAXILLARY ANTROSTOMY Bilateral 01/17/2019   Procedure: MAXILLARY ANTROSTOMY;  Surgeon: Clyde Canterbury, MD;  Location: [facility];  Service: ENT;  Laterality: Bilateral;   PARTIAL MASTECTOMY WITH NEEDLE LOCALIZATION Left 10/16/2016   Procedure: PARTIAL MASTECTOMY WITH NEEDLE LOCALIZATION;  Surgeon: Leonie Green, MD;  Location: [facility];  Service: General;  Laterality: Left;   SENTINEL NODE BIOPSY Left 10/16/2016   Procedure: SENTINEL NODE BIOPSY;  Surgeon: Leonie Green, MD;  Location: [facility];  Service: General;  Laterality: Left;   SPHENOIDECTOMY Left 01/17/2019   Procedure: SPHENOIDOTOMY;  Surgeon: Clyde Canterbury, MD;  Location: [facility];  Service: ENT;  Laterality: Left;   WRIST FRACTURE SURGERY Right    Family History  Problem Relation Age of Onset   Heart disease Father    Social History   Socioeconomic History   Marital status: Married    Spouse name: Not on file   Number of children: Not on file   Years of education: Not on file   Highest education level: Not on file  Occupational History   Not on file  Tobacco Use   Smoking status: Never   Smokeless tobacco: Never  Vaping Use   Vaping Use: Never used  Substance and Sexual Activity   Alcohol use: Yes    Alcohol/week: 6.0 standard drinks of alcohol    Types: 6 Cans of beer per week    Comment: BEER OCC   Drug use: No   Sexual activity: Not on file  Other Topics Concern   Not on file  Social History Narrative   Married.   1 child.    Works as a Optometrist.   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   Social Determinants of  Health   Financial Resource Strain: Low Risk  (09/26/2021)   Overall Financial Resource Strain (CARDIA)    Difficulty of Paying Living Expenses: Not hard at all  Food Insecurity: No Food Insecurity (09/26/2021)   Hunger Vital Sign    Worried About Running Out of Food in the Last Year: Never true    Ran Out of Food in the Last Year: Never true  Transportation Needs: No Transportation Needs (09/26/2021)   PRAPARE - Hydrologist (Medical): No    Lack of Transportation (Non-Medical): No  Physical Activity: Insufficiently Active (09/26/2021)   Exercise Vital Sign    Days of Exercise per Week: 4 days    Minutes of Exercise per Session:  30 min  Stress: Stress Concern Present (09/26/2021)   [place]    Feeling of Stress : To some extent  Social Connections: Socially Integrated (09/26/2021)   Social Connection and Isolation Panel [NHANES]    Frequency of Communication with Friends and Family: More than three times a week    Frequency of Social Gatherings with Friends and Family: More than three times a week    Attends Religious Services: More than 4 times per year    Active Member of [employer] or Organizations: Yes    Attends Music therapist: More than 4 times per year    Marital Status: Married    Tobacco Counseling Counseling given: Not Answered   Clinical Intake: How often do you need to have someone help you when you read instructions, pamphlets, or other written materials from your doctor or pharmacy?: 1 - Never  Diabetic?  No  Interpreter Needed?: NoActivities of Daily Living    09/26/2021    1:13 PM  In your present state of health, do you have any difficulty performing the following activities:  Hearing? 0  Vision? 0  Difficulty concentrating or making decisions? 0  Walking or climbing stairs? 0  Dressing or bathing? 0  Doing errands, shopping? 0  Preparing Food and  eating ? N  Using the Toilet? N  In the past six months, have you accidently leaked urine? N  Do you have problems with loss of bowel control? N  Managing your Medications? N  Managing your Finances? N  Housekeeping or managing your Housekeeping? N    Patient Care Team: Pleas Koch, NP as PCP - General (Internal Medicine)  Indicate any recent Medical Services you may have received from other than [facility] providers in the past year (date may be approximate).     Assessment:   This is a routine wellness examination for Raynelle Highland.  Hearing/Vision screen Hearing Screening - Comments:: No hearing difficulty Vision Screening - Comments:: Wears reading glasses. Followed by [facility]  Dietary issues and exercise activities discussed: Exercise limited by: None identified   Goals Addressed               This Visit's Progress     Stay healthy (pt-stated)        Lose weight.       Depression Screen    09/26/2021    1:10 PM 06/30/2021   11:01 AM 08/06/2020    7:45 AM 01/27/2017    9:08 AM 11/04/2016    1:36 PM  PHQ 2/9 Scores  PHQ - 2 Score 0 0 0 0 0  PHQ- 9 Score   0      Fall Risk    09/26/2021    1:14 PM 06/30/2021   11:01 AM 08/06/2020    7:46 AM 01/27/2017    9:08 AM 11/04/2016    1:36 PM  Fall Risk   Falls in the past year? 1 0 0 No No  Number falls in past yr: 0 0 0    Injury with Fall? 0 0 0    Risk for fall due to : No Fall Risks        FALL RISK PREVENTION PERTAINING TO THE HOME:  Any stairs in or around the home? No  If so, are there any without handrails? No  Home free of loose throw rugs in walkways, pet beds, electrical cords, etc? Yes  Adequate lighting in your home to  reduce risk of falls? Yes   ASSISTIVE DEVICES UTILIZED TO PREVENT FALLS:  Life alert? No  Use of a cane, walker or w/c? No  Grab bars in the bathroom? No  Shower chair or bench in shower? No  Elevated toilet seat or a handicapped toilet? Yes   TIMED UP AND GO:  Was the  test performed? No . Audio Visit  Cognitive Function:      Immunizations Immunization History  Administered Date(s) Administered   Fluad Quad(high Dose 65+) 11/30/2018, 12/04/2019   Influenza,inj,Quad PF,6+ Mos 12/07/2017   Influenza,inj,quad, With Preservative 12/25/2016   Influenza-Unspecified 12/14/2017   Moderna Sars-Covid-2 Vaccination 06/08/2019, 07/05/2019   PNEUMOCOCCAL CONJUGATE-20 08/06/2020   Zoster Recombinat (Shingrix) 06/09/2017, 08/04/2017, 08/11/2017    TDAP status: Due, Education has been provided regarding the importance of this vaccine. Advised may receive this vaccine at local pharmacy or Health Dept. Aware to provide a copy of the vaccination record if obtained from local pharmacy or Health Dept. Verbalized acceptance and understanding.  Flu Vaccine status: Up to date  Pneumococcal vaccine status: Up to date  Covid-19 vaccine status: Completed vaccines  Qualifies for Shingles Vaccine? Yes   Zostavax completed Yes   Shingrix Completed?: Yes  Screening Tests Health Maintenance  Topic Date Due   COVID-19 Vaccine (4 - Booster for Moderna series) 10/12/2021 (Originally 02/04/2021)   COLONOSCOPY (Pts 45-70yr Insurance coverage will need to be confirmed)  06/11/2022 (Originally 04/21/1997)   TETANUS/TDAP  09/27/2022 (Originally [birth year]/22/1971)   INFLUENZA VACCINE  10/07/2021   MAMMOGRAM  09/26/2022   Pneumonia Vaccine 69 Years old  Completed   DEXA SCAN  Completed   Hepatitis C Screening  Completed   Zoster Vaccines- Shingrix  Completed   HPV VACCINES  Aged Out    Health Maintenance  There are no preventive care reminders to display for this patient.     Mammogram status: Completed 09/25/20. Repeat every year  Bone Density status: Completed 07/10/21. Results reflect: Bone density results: OSTEOPOROSIS. Repeat every   years.  Lung Cancer Screening: (Low Dose CT Chest recommended if Age 69-80years, 30 pack-year currently smoking OR have quit w/in  15years.)  qualify.    Additional Screening:  Hepatitis C Screening: does qualify; Completed 09/20/18  Vision Screening: Recommended annual ophthalmology exams for early detection of glaucoma and other disorders of the eye. Is the patient up to date with their annual eye exam?  Yes  Who is the provider or what is the name of the office in which the patient attends annual eye exams? [facility] If pt is not established with a provider, would they like to be referred to a provider to establish care? No .   Dental Screening: Recommended annual dental exams for proper oral hygiene  Community Resource Referral / Chronic Care Management:  CRR required this visit?  No   CCM required this visit?  No      Plan:     I have personally reviewed and noted the following in the patient's chart:   Medical and social history Use of alcohol, tobacco or illicit drugs  Current medications and supplements including opioid prescriptions.  Functional ability and status Nutritional status Physical activity Advanced directives List of other physicians Hospitalizations, surgeries, and ER visits in previous 12 months Vitals Screenings to include cognitive, depression, and falls Referrals and appointments  In addition, I have reviewed and discussed with patient certain preventive protocols, quality metrics, and best practice recommendations. A written personalized care plan for preventive services  as well as general preventive health recommendations were provided to patient.     [name], LPN   [id number]   Nurse Notes: None

## 2021-10-01 DIAGNOSIS — Z1211 Encounter for screening for malignant neoplasm of colon: Secondary | ICD-10-CM | POA: Diagnosis not present

## 2021-10-15 ENCOUNTER — Ambulatory Visit
Admission: RE | Admit: 2021-10-15 | Discharge: 2021-10-15 | Disposition: A | Discharge status: Home / Self Care | Payer: Medicare PPO | Source: Ambulatory Visit | Attending: Obstetrics and Gynecology | Admitting: Obstetrics and Gynecology

## 2021-10-15 DIAGNOSIS — Z1231 Encounter for screening mammogram for malignant neoplasm of breast: Secondary | ICD-10-CM | POA: Insufficient documentation

## 2021-10-17 ENCOUNTER — Encounter: Payer: Self-pay | Admitting: Primary Care

## 2021-10-17 ENCOUNTER — Ambulatory Visit (INDEPENDENT_AMBULATORY_CARE_PROVIDER_SITE_OTHER): Payer: Medicare PPO | Admitting: Primary Care

## 2021-10-17 VITALS — BP 136/78 | HR 62 | Temp 98.6°F | Ht 59.0 in | Wt 143.0 lb

## 2021-10-17 DIAGNOSIS — Z853 Personal history of malignant neoplasm of breast: Secondary | ICD-10-CM | POA: Diagnosis not present

## 2021-10-17 DIAGNOSIS — M810 Age-related osteoporosis without current pathological fracture: Secondary | ICD-10-CM | POA: Diagnosis not present

## 2021-10-17 DIAGNOSIS — E785 Hyperlipidemia, unspecified: Secondary | ICD-10-CM | POA: Diagnosis not present

## 2021-10-17 DIAGNOSIS — E039 Hypothyroidism, unspecified: Secondary | ICD-10-CM | POA: Diagnosis not present

## 2021-10-17 DIAGNOSIS — R7303 Prediabetes: Secondary | ICD-10-CM

## 2021-10-17 DIAGNOSIS — M25562 Pain in left knee: Secondary | ICD-10-CM

## 2021-10-17 DIAGNOSIS — K219 Gastro-esophageal reflux disease without esophagitis: Secondary | ICD-10-CM

## 2021-10-17 DIAGNOSIS — G8929 Other chronic pain: Secondary | ICD-10-CM

## 2021-10-17 DIAGNOSIS — Z1211 Encounter for screening for malignant neoplasm of colon: Secondary | ICD-10-CM

## 2021-10-17 DIAGNOSIS — M25569 Pain in unspecified knee: Secondary | ICD-10-CM | POA: Diagnosis not present

## 2021-10-17 DIAGNOSIS — Z0000 Encounter for general adult medical examination without abnormal findings: Secondary | ICD-10-CM | POA: Diagnosis not present

## 2021-10-17 DIAGNOSIS — M25561 Pain in right knee: Secondary | ICD-10-CM

## 2021-10-17 MED ORDER — DICLOFENAC SODIUM 1 % EX GEL: 2.0000 g | Freq: Three times a day (TID) | CUTANEOUS | 0 refills | Status: DC | PRN

## 2021-10-17 NOTE — Assessment & Plan Note (Signed)
Reviewed bone density scan from May 2023. Improved.  Continue calcium and vitamin D daily. Continue daily walking.

## 2021-10-17 NOTE — Assessment & Plan Note (Signed)
Reviewed mammogram from August 2023. Following with oncology, reviewed notes from March 2023.

## 2021-10-17 NOTE — Assessment & Plan Note (Signed)
Controlled.  Continue omeprazole 10 mg daily.

## 2021-10-17 NOTE — Assessment & Plan Note (Signed)
Controlled.  Continue atorvastatin 10 mg daily. Reviewed lipid panel from April 2023.

## 2021-10-17 NOTE — Assessment & Plan Note (Signed)
Following with sports medicine. Improving.  Refill provided for Diclofenac 1% gel.

## 2021-10-17 NOTE — Progress Notes (Signed)
Subjective:    Patient ID: Mallory Leblanc, female    DOB: 25-Dec-1950, 71 y.o.   MRN: [id number]  HPI  Mallory Leblanc is a very pleasant 71 y.o. female who presents today for complete physical and follow up of chronic conditions.  Immunizations: -Influenza: Due this season  -Covid-19: 3 vaccines -Shingles: Completed Shingrix series -Pneumonia: Prevnar 20 in 2022, Pneumovax in 2023   Diet: [place].  Exercise: Regular exercise.  Eye exam: Completes annually  Dental exam: Completes semi-annually   Mammogram: Completed in August 2023  Colonoscopy: Completed stool card in July 2023, negative Dexa: Completed in 2023   BP Readings from Last 3 Encounters:  10/17/21 136/78  06/30/21 140/74  06/10/21 (!) 148/80   Wt Readings from Last 3 Encounters:  10/17/21 143 lb (64.9 kg)  09/26/21 144 lb (65.3 kg)  06/30/21 150 lb (68 kg)       Review of Systems  Constitutional:  Negative for unexpected weight change.  HENT:  Negative for rhinorrhea.   Respiratory:  Negative for cough and shortness of breath.   Cardiovascular:  Negative for chest pain.  Gastrointestinal:  Negative for constipation and diarrhea.  Genitourinary:  Negative for difficulty urinating and menstrual problem.  Musculoskeletal:  Negative for arthralgias and myalgias.  Skin:  Negative for rash.  Allergic/Immunologic: Negative for environmental allergies.  Neurological:  Negative for dizziness and headaches.  Psychiatric/Behavioral:  The patient is not nervous/anxious.          Past Medical History:  Diagnosis Date   Anemia    Arthritis    LEFT KNEE   Breast cancer ([place]) 2018   Left Breast Cancer- DCIS   Genital warts    GERD (gastroesophageal reflux disease)    OCC   Hyperlipidemia    Hypothyroidism    Personal history of radiation therapy 2018   F/U left breast cancer    Social History   Socioeconomic History   Marital status: Married    Spouse name: Not on file   Number of children:  Not on file   Years of education: Not on file   Highest education level: Not on file  Occupational History   Not on file  Tobacco Use   Smoking status: Never   Smokeless tobacco: Never  Vaping Use   Vaping Use: Never used  Substance and Sexual Activity   Alcohol use: Yes    Alcohol/week: 6.0 standard drinks of alcohol    Types: 6 Cans of beer per week    Comment: BEER OCC   Drug use: No   Sexual activity: Not on file  Other Topics Concern   Not on file  Social History Narrative   Married.   1 child.    Works as a Optometrist.   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   Social Determinants of Health   Financial Resource Strain: Low Risk  (09/26/2021)   Overall Financial Resource Strain (CARDIA)    Difficulty of Paying Living Expenses: Not hard at all  Food Insecurity: No Food Insecurity (09/26/2021)   Hunger Vital Sign    Worried About Running Out of Food in the Last Year: Never true    Ran Out of Food in the Last Year: Never true  Transportation Needs: No Transportation Needs (09/26/2021)   PRAPARE - Hydrologist (Medical): No    Lack of Transportation (Non-Medical): No  Physical Activity: Insufficiently Active (09/26/2021)   Exercise  Vital Sign    Days of Exercise per Week: 4 days    Minutes of Exercise per Session: 30 min  Stress: Stress Concern Present (09/26/2021)   [place]    Feeling of Stress : To some extent  Social Connections: Socially Integrated (09/26/2021)   Social Connection and Isolation Panel [NHANES]    Frequency of Communication with Friends and Family: More than three times a week    Frequency of Social Gatherings with Friends and Family: More than three times a week    Attends Religious Services: More than 4 times per year    Active Member of Clubs or Organizations: Yes    Attends Archivist Meetings: More  than 4 times per year    Marital Status: Married  Human resources officer Violence: Not At Risk (09/26/2021)   Humiliation, Afraid, Rape, and Kick questionnaire    Fear of Current or Ex-Partner: No    Emotionally Abused: No    Physically Abused: No    Sexually Abused: No    Past Surgical History:  Procedure Laterality Date   APPENDECTOMY     BREAST BIOPSY Left 09/21/2016   DCIS   BREAST LUMPECTOMY Left 2018   DCIS   cyst     on the left ovary early 80's   ETHMOIDECTOMY Bilateral 01/17/2019   Procedure: ETHMOIDECTOMY;  Surgeon: Clyde Canterbury, MD;  Location: [place];  Service: ENT;  Laterality: Bilateral;   FRONTAL SINUS EXPLORATION Bilateral 01/17/2019   Procedure: FRONTAL SINUS EXPLORATION;  Surgeon: Clyde Canterbury, MD;  Location: [place];  Service: ENT;  Laterality: Bilateral;   IMAGE GUIDED SINUS SURGERY N/A 01/17/2019   Procedure: IMAGE GUIDED SINUS SURGERY;  Surgeon: Clyde Canterbury, MD;  Location: [place];  Service: ENT;  Laterality: N/A;  NEED STRYKER DISK put disk on or charge nurse desk 10-30  kp gave 2nd disk to Manuela Schwartz 11-4 kp   MAXILLARY ANTROSTOMY Bilateral 01/17/2019   Procedure: MAXILLARY ANTROSTOMY;  Surgeon: Clyde Canterbury, MD;  Location: [place];  Service: ENT;  Laterality: Bilateral;   PARTIAL MASTECTOMY WITH NEEDLE LOCALIZATION Left 10/16/2016   Procedure: PARTIAL MASTECTOMY WITH NEEDLE LOCALIZATION;  Surgeon: Leonie Green, MD;  Location: [facility];  Service: General;  Laterality: Left;   SENTINEL NODE BIOPSY Left 10/16/2016   Procedure: SENTINEL NODE BIOPSY;  Surgeon: Leonie Green, MD;  Location: [facility];  Service: General;  Laterality: Left;   SPHENOIDECTOMY Left 01/17/2019   Procedure: SPHENOIDOTOMY;  Surgeon: Clyde Canterbury, MD;  Location: [place];  Service: ENT;  Laterality: Left;   WRIST FRACTURE SURGERY Right     Family History  Problem Relation Age of Onset   Heart disease Father      Allergies  Allergen Reactions   Sulfa Antibiotics Rash    Current Outpatient Medications on File Prior to Visit  Medication Sig Dispense Refill   acetaminophen (TYLENOL) 500 MG tablet Take 1,000 mg by mouth every 8 (eight) hours as needed for mild pain or moderate pain.     atorvastatin (LIPITOR) 10 MG tablet TAKE 1 TABLET BY MOUTH ONCE A DAY FOR CHOLESTEROL 90 tablet 2   azelastine (ASTELIN) 0.1 % nasal spray Place 1 spray into both nostrils 2 (two) times daily. Use in each nostril as directed 30 mL 0   Cholecalciferol (D3 VITAMIN PO) Take by mouth.     GNP VITAMIN B-12 500 MCG tablet TAKE 1 TABLET BY MOUTH ONCE A  DAY 90 tablet 3   letrozole (FEMARA) 2.5 MG tablet TAKE 1 TABLET BY MOUTH ONCE A DAY 90 tablet 1   levocetirizine (XYZAL) 5 MG tablet Take by mouth.     levothyroxine (SYNTHROID) 88 MCG tablet Take 1 tablet by mouth every morning on an empty stomach with water only.  No food or other medications for 30 minutes. 90 tablet 3   montelukast (SINGULAIR) 10 MG tablet Take by mouth.     Multiple Minerals-Vitamins (CALCIUM & VIT D3 BONE HEALTH PO) Take 1 capsule by mouth daily.     omeprazole (PRILOSEC) 10 MG capsule Take 10 mg by mouth daily.     No current facility-administered medications on file prior to visit.    BP 136/78   Pulse 62   Temp 98.6 F (37 C) (Oral)   Ht '4\' 11"'$  (1.499 m)   Wt 143 lb (64.9 kg)   SpO2 97%   BMI 28.88 kg/m  Objective:   Physical Exam HENT:     Right Ear: Tympanic membrane and ear canal normal.     Left Ear: Tympanic membrane and ear canal normal.     Nose: Nose normal.  Eyes:     Conjunctiva/sclera: Conjunctivae normal.     Pupils: Pupils are equal, round, and reactive to light.  Neck:     Thyroid: No thyromegaly.  Cardiovascular:     Rate and Rhythm: Normal rate and regular rhythm.     Heart sounds: No murmur heard. Pulmonary:     Effort: Pulmonary effort is normal.     Breath sounds: Normal breath sounds. No rales.  Abdominal:      General: Bowel sounds are normal.     Palpations: Abdomen is soft.     Tenderness: There is no abdominal tenderness.  Musculoskeletal:        General: Normal range of motion.     Cervical back: Neck supple.  Lymphadenopathy:     Cervical: No cervical adenopathy.  Skin:    General: Skin is warm and dry.     Findings: No rash.  Neurological:     Mental Status: She is alert and oriented to person, place, and time.     Cranial Nerves: No cranial nerve deficit.     Deep Tendon Reflexes: Reflexes are normal and symmetric.  Psychiatric:        Mood and Affect: Mood normal.           Assessment & Plan:   Problem List Items Addressed This Visit       Digestive   GERD (gastroesophageal reflux disease)    Controlled.  Continue omeprazole 10 mg daily.         Endocrine   Hypothyroidism    She is taking levothyroxine correctly.  Continue levothyroxine 88 mcg daily. Reviewed TSH from April 2023        Musculoskeletal and Integument   Osteoporosis without current pathological fracture    Reviewed bone density scan from May 2023. Improved.  Continue calcium and vitamin D daily. Continue daily walking.        Other   Hyperlipidemia    Controlled.  Continue atorvastatin 10 mg daily. Reviewed lipid panel from April 2023.      Knee pain    Following with sports medicine. Improving.  Refill provided for Diclofenac 1% gel.       Relevant Medications   diclofenac Sodium (VOLTAREN) 1 % GEL   History of breast cancer    Reviewed mammogram  from August 2023. Following with oncology, reviewed notes from March 2023.      Prediabetes    Repeat A1C pending for October 2023.  Discussed the importance of a healthy diet and regular exercise in order for weight loss, and to reduce the risk of further co-morbidity.       Preventative health care - Primary    Immunizations UTD. Mammogram and bone density scan UTD. She agrees to [employer] for colon cancer  screening, orders placed.  Discussed the importance of a healthy diet and regular exercise in order for weight loss, and to reduce the risk of further co-morbidity.  Exam stable. Labs reviewed from April 2023.  Follow up in 1 year for repeat physical.       Other Visit Diagnoses     Screening for colon cancer       Relevant Orders   Cologuard          Pleas Koch, NP

## 2021-10-17 NOTE — Patient Instructions (Signed)
Complete the Cologuard Kit once received.  Return in October for your lab appointment.  It was a pleasure to see you today!  Preventive Care 80 Years and Older, Female Preventive care refers to lifestyle choices and visits with your health care provider that can promote health and wellness. Preventive care visits are also called wellness exams. What can I expect for my preventive care visit? Counseling Your health care provider may ask you questions about your: Medical history, including: Past medical problems. Family medical history. Pregnancy and menstrual history. History of falls. Current health, including: Memory and ability to understand (cognition). Emotional well-being. Home life and relationship well-being. Sexual activity and sexual health. Lifestyle, including: Alcohol, nicotine or tobacco, and drug use. Access to firearms. Diet, exercise, and sleep habits. Work and work Statistician. Sunscreen use. Safety issues such as seatbelt and bike helmet use. Physical exam Your health care provider will check your: Height and weight. These may be used to calculate your BMI (body mass index). BMI is a measurement that tells if you are at a healthy weight. Waist circumference. This measures the distance around your waistline. This measurement also tells if you are at a healthy weight and may help predict your risk of certain diseases, such as type 2 diabetes and high blood pressure. Heart rate and blood pressure. Body temperature. Skin for abnormal spots. What immunizations do I need?  Vaccines are usually given at various ages, according to a schedule. Your health care provider will recommend vaccines for you based on your age, medical history, and lifestyle or other factors, such as travel or where you work. What tests do I need? Screening Your health care provider may recommend screening tests for certain conditions. This may include: Lipid and cholesterol  levels. Hepatitis C test. Hepatitis B test. HIV (human immunodeficiency virus) test. STI (sexually transmitted infection) testing, if you are at risk. Lung cancer screening. Colorectal cancer screening. Diabetes screening. This is done by checking your blood sugar (glucose) after you have not eaten for a while (fasting). Mammogram. Talk with your health care provider about how often you should have regular mammograms. BRCA-related cancer screening. This may be done if you have a family history of breast, ovarian, tubal, or peritoneal cancers. Bone density scan. This is done to screen for osteoporosis. Talk with your health care provider about your test results, treatment options, and if necessary, the need for more tests. Follow these instructions at home: Eating and drinking  Eat a diet that includes fresh fruits and vegetables, whole grains, lean protein, and low-fat dairy products. Limit your intake of foods with high amounts of sugar, saturated fats, and salt. Take vitamin and mineral supplements as recommended by your health care provider. Do not drink alcohol if your health care provider tells you not to drink. If you drink alcohol: Limit how much you have to 0-1 drink a day. Know how much alcohol is in your drink. In the [place] one drink equals one 12 oz bottle of beer (355 mL), one 5 oz glass of wine (148 mL), or one 1 oz glass of hard liquor (44 mL). Lifestyle Brush your teeth every morning and night with fluoride toothpaste. Floss one time each day. Exercise for at least 30 minutes 5 or more days each week. Do not use any products that contain nicotine or tobacco. These products include cigarettes, chewing tobacco, and vaping devices, such as e-cigarettes. If you need help quitting, ask your health care provider. Do not use drugs. If you are  sexually active, practice safe sex. Use a condom or other form of protection in order to prevent STIs. Take aspirin only as told by your  health care provider. Make sure that you understand how much to take and what form to take. Work with your health care provider to find out whether it is safe and beneficial for you to take aspirin daily. Ask your health care provider if you need to take a cholesterol-lowering medicine (statin). Find healthy ways to manage stress, such as: Meditation, yoga, or listening to music. Journaling. Talking to a trusted person. Spending time with friends and family. Minimize exposure to UV radiation to reduce your risk of skin cancer. Safety Always wear your seat belt while driving or riding in a vehicle. Do not drive: If you have been drinking alcohol. Do not ride with someone who has been drinking. When you are tired or distracted. While texting. If you have been using any mind-altering substances or drugs. Wear a helmet and other protective equipment during sports activities. If you have firearms in your house, make sure you follow all gun safety procedures. What's next? Visit your health care provider once a year for an annual wellness visit. Ask your health care provider how often you should have your eyes and teeth checked. Stay up to date on all vaccines. This information is not intended to replace advice given to you by your health care provider. Make sure you discuss any questions you have with your health care provider. Document Revised: 08/21/2020 Document Reviewed: 08/21/2020 Elsevier Patient Education  [place].

## 2021-10-17 NOTE — Assessment & Plan Note (Signed)
Repeat A1C pending for October 2023.  Discussed the importance of a healthy diet and regular exercise in order for weight loss, and to reduce the risk of further co-morbidity.

## 2021-10-17 NOTE — Assessment & Plan Note (Signed)
She is taking levothyroxine correctly.  Continue levothyroxine 88 mcg daily. Reviewed TSH from April 2023

## 2021-10-17 NOTE — Assessment & Plan Note (Signed)
Immunizations UTD. Mammogram and bone density scan UTD. She agrees to [employer] for colon cancer screening, orders placed.  Discussed the importance of a healthy diet and regular exercise in order for weight loss, and to reduce the risk of further co-morbidity.  Exam stable. Labs reviewed from April 2023.  Follow up in 1 year for repeat physical.

## 2021-10-23 DIAGNOSIS — Z1211 Encounter for screening for malignant neoplasm of colon: Secondary | ICD-10-CM | POA: Diagnosis not present

## 2021-10-31 LAB — COLOGUARD: COLOGUARD: NEGATIVE

## 2021-11-26 ENCOUNTER — Other Ambulatory Visit: Payer: Self-pay | Admitting: Primary Care

## 2021-11-26 DIAGNOSIS — R7303 Prediabetes: Secondary | ICD-10-CM

## 2021-11-27 ENCOUNTER — Other Ambulatory Visit: Payer: Self-pay | Admitting: Oncology

## 2021-11-28 ENCOUNTER — Encounter: Payer: Self-pay | Admitting: Oncology

## 2021-12-08 ENCOUNTER — Other Ambulatory Visit: Payer: Medicare PPO

## 2021-12-11 ENCOUNTER — Other Ambulatory Visit (INDEPENDENT_AMBULATORY_CARE_PROVIDER_SITE_OTHER): Payer: Medicare PPO

## 2021-12-11 DIAGNOSIS — R7303 Prediabetes: Secondary | ICD-10-CM | POA: Diagnosis not present

## 2021-12-11 LAB — POCT GLYCOSYLATED HEMOGLOBIN (HGB A1C): Hemoglobin A1C: 5.9 % — AB (ref 4.0–5.6)

## 2022-01-02 DIAGNOSIS — H524 Presbyopia: Secondary | ICD-10-CM | POA: Diagnosis not present

## 2022-01-02 DIAGNOSIS — H5203 Hypermetropia, bilateral: Secondary | ICD-10-CM | POA: Diagnosis not present

## 2022-01-02 DIAGNOSIS — Z83518 Family history of other specified eye disorder: Secondary | ICD-10-CM | POA: Diagnosis not present

## 2022-01-02 DIAGNOSIS — H2513 Age-related nuclear cataract, bilateral: Secondary | ICD-10-CM | POA: Diagnosis not present

## 2022-02-26 ENCOUNTER — Encounter: Payer: Self-pay | Admitting: Primary Care

## 2022-02-26 ENCOUNTER — Ambulatory Visit: Payer: Medicare PPO | Admitting: Primary Care

## 2022-02-26 VITALS — BP 138/78 | HR 73 | Temp 98.6°F | Ht 59.0 in | Wt 140.0 lb

## 2022-02-26 DIAGNOSIS — J029 Acute pharyngitis, unspecified: Secondary | ICD-10-CM | POA: Insufficient documentation

## 2022-02-26 LAB — POCT RAPID STREP A (OFFICE): Rapid Strep A Screen: NEGATIVE

## 2022-02-26 LAB — POC COVID19 BINAXNOW: SARS Coronavirus 2 Ag: NEGATIVE

## 2022-02-26 NOTE — Assessment & Plan Note (Signed)
Differentials include GERD, strep pharyngitis, allergies.  She does not appear sickly and does not appear sickly.  Rapid Covid and strep tests negative today.   Continue Singulair 10 mg HS, add Claritin 10 mg daily, increase omeprazole to 20 mg HS.  Stop Tylenol. Switch to Ibuprofen 400 mg every 8 hours.   She will update early next week.

## 2022-02-26 NOTE — Progress Notes (Signed)
Subjective:    Patient ID: Mallory Leblanc, female    DOB: 1950/07/11, 71 y.o.   MRN: [id number]  Sore Throat  Associated symptoms include congestion. Pertinent negatives include no ear pain, headaches or shortness of breath.    Mallory Leblanc is a very pleasant 71 y.o. femalewith a history of chronic sinusitis, allergic rhinitis, GERD who presents today to discuss sore throat.   Symptom onset 8 days ago with scratchy throat. She then developed a sore throat, nasal congestion. Since then her sore throat and nasal congestion have persisted.   Today she's feeling worse than she did a few days ago, but doesn't eel sick. Her throat is painful and "feels raw", especially when swallowing.   She denies fevers, cough, body aches, chills, cough, esophageal burning, belching. She completed a home Covid-19 test at symptom onset which was negative. She denies sick contacts.   She's been using Astelin nasal spray 0.1%, Singulair 10 mg, Tylenol Cough and Cold without much improvement. She is taking her omeprazole 10 mg daily.    Review of Systems  Constitutional:  Negative for chills, fatigue and fever.  HENT:  Positive for congestion and sore throat. Negative for ear pain, sinus pressure and sinus pain.   Respiratory:  Negative for shortness of breath.   Neurological:  Negative for headaches.         Past Medical History:  Diagnosis Date   Anemia    Arthritis    LEFT KNEE   Breast cancer ([place]) 2018   Left Breast Cancer- DCIS   Genital warts    GERD (gastroesophageal reflux disease)    OCC   Hyperlipidemia    Hypothyroidism    Personal history of radiation therapy 2018   F/U left breast cancer    Social History   Socioeconomic History   Marital status: Married    Spouse name: Not on file   Number of children: Not on file   Years of education: Not on file   Highest education level: Not on file  Occupational History   Not on file  Tobacco Use   Smoking status: Never    Smokeless tobacco: Never  Vaping Use   Vaping Use: Never used  Substance and Sexual Activity   Alcohol use: Yes    Alcohol/week: 6.0 standard drinks of alcohol    Types: 6 Cans of beer per week    Comment: BEER OCC   Drug use: No   Sexual activity: Not on file  Other Topics Concern   Not on file  Social History Narrative   Married.   1 child.    Works as a Optometrist.   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   Social Determinants of Health   Financial Resource Strain: Low Risk  (09/26/2021)   Overall Financial Resource Strain (CARDIA)    Difficulty of Paying Living Expenses: Not hard at all  Food Insecurity: No Food Insecurity (09/26/2021)   Hunger Vital Sign    Worried About Running Out of Food in the Last Year: Never true    Ran Out of Food in the Last Year: Never true  Transportation Needs: No Transportation Needs (09/26/2021)   PRAPARE - Hydrologist (Medical): No    Lack of Transportation (Non-Medical): No  Physical Activity: Insufficiently Active (09/26/2021)   Exercise Vital Sign    Days of Exercise per Week: 4 days    Minutes of Exercise per Session:  30 min  Stress: Stress Concern Present (09/26/2021)   [place]    Feeling of Stress : To some extent  Social Connections: Socially Integrated (09/26/2021)   Social Connection and Isolation Panel [NHANES]    Frequency of Communication with Friends and Family: More than three times a week    Frequency of Social Gatherings with Friends and Family: More than three times a week    Attends Religious Services: More than 4 times per year    Active Member of Clubs or Organizations: Yes    Attends Archivist Meetings: More than 4 times per year    Marital Status: Married  Human resources officer Violence: Not At Risk (09/26/2021)   Humiliation, Afraid, Rape, and Kick questionnaire    Fear  of Current or Ex-Partner: No    Emotionally Abused: No    Physically Abused: No    Sexually Abused: No    Past Surgical History:  Procedure Laterality Date   APPENDECTOMY     BREAST BIOPSY Left 09/21/2016   DCIS   BREAST LUMPECTOMY Left 2018   DCIS   cyst     on the left ovary early 80's   ETHMOIDECTOMY Bilateral 01/17/2019   Procedure: ETHMOIDECTOMY;  Surgeon: Clyde Canterbury, MD;  Location: [place];  Service: ENT;  Laterality: Bilateral;   FRONTAL SINUS EXPLORATION Bilateral 01/17/2019   Procedure: FRONTAL SINUS EXPLORATION;  Surgeon: Clyde Canterbury, MD;  Location: [place];  Service: ENT;  Laterality: Bilateral;   IMAGE GUIDED SINUS SURGERY N/A 01/17/2019   Procedure: IMAGE GUIDED SINUS SURGERY;  Surgeon: Clyde Canterbury, MD;  Location: [place];  Service: ENT;  Laterality: N/A;  NEED STRYKER DISK put disk on or charge nurse desk 10-30  kp gave 2nd disk to Manuela Schwartz 11-4 kp   MAXILLARY ANTROSTOMY Bilateral 01/17/2019   Procedure: MAXILLARY ANTROSTOMY;  Surgeon: Clyde Canterbury, MD;  Location: [place];  Service: ENT;  Laterality: Bilateral;   PARTIAL MASTECTOMY WITH NEEDLE LOCALIZATION Left 10/16/2016   Procedure: PARTIAL MASTECTOMY WITH NEEDLE LOCALIZATION;  Surgeon: Leonie Green, MD;  Location: [facility];  Service: General;  Laterality: Left;   SENTINEL NODE BIOPSY Left 10/16/2016   Procedure: SENTINEL NODE BIOPSY;  Surgeon: Leonie Green, MD;  Location: [facility];  Service: General;  Laterality: Left;   SPHENOIDECTOMY Left 01/17/2019   Procedure: SPHENOIDOTOMY;  Surgeon: Clyde Canterbury, MD;  Location: [place];  Service: ENT;  Laterality: Left;   WRIST FRACTURE SURGERY Right     Family History  Problem Relation Age of Onset   Heart disease Father     Allergies  Allergen Reactions   Sulfa Antibiotics Rash    Current Outpatient Medications on File Prior to Visit  Medication Sig Dispense Refill   acetaminophen  (TYLENOL) 500 MG tablet Take 1,000 mg by mouth every 8 (eight) hours as needed for mild pain or moderate pain.     atorvastatin (LIPITOR) 10 MG tablet TAKE 1 TABLET BY MOUTH ONCE A DAY FOR CHOLESTEROL 90 tablet 2   azelastine (ASTELIN) 0.1 % nasal spray Place 1 spray into both nostrils 2 (two) times daily. Use in each nostril as directed 30 mL 0   Cholecalciferol (D3 VITAMIN PO) Take by mouth.     diclofenac Sodium (VOLTAREN) 1 % GEL Apply 2 g topically 3 (three) times daily as needed. 300 g 0   GNP VITAMIN B-12 500 MCG tablet TAKE 1 TABLET BY MOUTH ONCE  A DAY 90 tablet 3   letrozole (FEMARA) 2.5 MG tablet TAKE 1 TABLET BY MOUTH ONCE A DAY 90 tablet 1   levocetirizine (XYZAL) 5 MG tablet Take by mouth.     levothyroxine (SYNTHROID) 88 MCG tablet Take 1 tablet by mouth every morning on an empty stomach with water only.  No food or other medications for 30 minutes. 90 tablet 3   montelukast (SINGULAIR) 10 MG tablet Take by mouth.     Multiple Minerals-Vitamins (CALCIUM & VIT D3 BONE HEALTH PO) Take 1 capsule by mouth daily.     omeprazole (PRILOSEC) 10 MG capsule Take 10 mg by mouth daily.     No current facility-administered medications on file prior to visit.    BP 138/78   Pulse 73   Temp 98.6 F (37 C) (Temporal)   Ht '4\' 11"'$  (1.499 m)   Wt 140 lb (63.5 kg)   SpO2 98%   BMI 28.28 kg/m  Objective:   Physical Exam HENT:     Right Ear: Tympanic membrane and ear canal normal.     Left Ear: Tympanic membrane and ear canal normal.     Nose:     Right Sinus: No maxillary sinus tenderness or frontal sinus tenderness.     Left Sinus: No maxillary sinus tenderness or frontal sinus tenderness.     Mouth/Throat:     Pharynx: Oropharyngeal exudate present. No posterior oropharyngeal erythema.  Eyes:     Conjunctiva/sclera: Conjunctivae normal.  Neck:      Comments: Bilateral submandibular anterior cervical adenopathy  Cardiovascular:     Rate and Rhythm: Normal rate and regular rhythm.   Pulmonary:     Effort: Pulmonary effort is normal.     Breath sounds: Normal breath sounds. No wheezing or rales.  Musculoskeletal:     Cervical back: Neck supple.  Lymphadenopathy:     Cervical: Cervical adenopathy present.  Skin:    General: Skin is warm and dry.           Assessment & Plan:   Problem List Items Addressed This Visit       Other   Sore throat - Primary    Differentials include GERD, strep pharyngitis, allergies.  She does not appear sickly and does not appear sickly.  Rapid Covid and strep tests negative today.   Continue Singulair 10 mg HS, add Claritin 10 mg daily, increase omeprazole to 20 mg HS.  Stop Tylenol. Switch to Ibuprofen 400 mg every 8 hours.   She will update early next week.       Relevant Orders   POC COVID-19 (Completed)   POCT rapid strep A (Completed)       Pleas Koch, NP

## 2022-02-26 NOTE — Patient Instructions (Signed)
Increase your omeprazole to 20 mg at bedtime.  Start Claritin 10 mg daily for allergies. Continue Singulair 10 mg HS.  Stop Tylenol.  Start Ibuprofen 400 mg every 8 hours as needed.  Please update me early next week.  It was a pleasure to see you today!

## 2022-03-03 DIAGNOSIS — J029 Acute pharyngitis, unspecified: Secondary | ICD-10-CM

## 2022-03-03 MED ORDER — AMOXICILLIN-POT CLAVULANATE 875-125 MG PO TABS: 1.0000 | ORAL_TABLET | Freq: Two times a day (BID) | ORAL | 0 refills | Status: DC

## 2022-03-13 ENCOUNTER — Other Ambulatory Visit: Payer: Self-pay | Admitting: Primary Care

## 2022-03-13 DIAGNOSIS — G8929 Other chronic pain: Secondary | ICD-10-CM

## 2022-03-31 DIAGNOSIS — J300 Vasomotor rhinitis: Secondary | ICD-10-CM | POA: Diagnosis not present

## 2022-03-31 DIAGNOSIS — J019 Acute sinusitis, unspecified: Secondary | ICD-10-CM | POA: Diagnosis not present

## 2022-04-10 DIAGNOSIS — J339 Nasal polyp, unspecified: Secondary | ICD-10-CM | POA: Diagnosis not present

## 2022-04-10 DIAGNOSIS — J329 Chronic sinusitis, unspecified: Secondary | ICD-10-CM | POA: Diagnosis not present

## 2022-04-22 ENCOUNTER — Other Ambulatory Visit: Payer: Self-pay | Admitting: Primary Care

## 2022-04-22 DIAGNOSIS — E039 Hypothyroidism, unspecified: Secondary | ICD-10-CM

## 2022-05-26 ENCOUNTER — Encounter: Payer: Self-pay | Admitting: Oncology

## 2022-05-26 ENCOUNTER — Inpatient Hospital Stay: Payer: Medicare PPO

## 2022-05-26 ENCOUNTER — Inpatient Hospital Stay: Payer: Medicare PPO | Attending: Oncology | Admitting: Oncology

## 2022-05-26 VITALS — BP 184/97 | HR 68 | Temp 97.7°F | Resp 18 | Wt 141.9 lb

## 2022-05-26 DIAGNOSIS — D0512 Intraductal carcinoma in situ of left breast: Secondary | ICD-10-CM

## 2022-05-26 DIAGNOSIS — Z79899 Other long term (current) drug therapy: Secondary | ICD-10-CM | POA: Insufficient documentation

## 2022-05-26 DIAGNOSIS — M810 Age-related osteoporosis without current pathological fracture: Secondary | ICD-10-CM | POA: Insufficient documentation

## 2022-05-26 DIAGNOSIS — D649 Anemia, unspecified: Secondary | ICD-10-CM | POA: Diagnosis not present

## 2022-05-26 LAB — COMPREHENSIVE METABOLIC PANEL
ALT: 32 U/L (ref 0–44)
AST: 37 U/L (ref 15–41)
Albumin: 4.5 g/dL (ref 3.5–5.0)
Alkaline Phosphatase: 80 U/L (ref 38–126)
Anion gap: 7 (ref 5–15)
BUN: 18 mg/dL (ref 8–23)
CO2: 25 mmol/L (ref 22–32)
Calcium: 9.4 mg/dL (ref 8.9–10.3)
Chloride: 104 mmol/L (ref 98–111)
Creatinine, Ser: 0.66 mg/dL (ref 0.44–1.00)
GFR, Estimated: >60 mL/min (ref >60)
Glucose, Bld: 102 mg/dL — ABNORMAL HIGH (ref 70–99)
Potassium: 4 mmol/L (ref 3.5–5.1)
Sodium: 136 mmol/L (ref 135–145)
Total Bilirubin: 0.5 mg/dL (ref 0.3–1.2)
Total Protein: 7.4 g/dL (ref 6.5–8.1)

## 2022-05-26 LAB — CBC WITH DIFFERENTIAL/PLATELET
Abs Immature Granulocytes: 0.02 10*3/uL (ref 0.00–0.07)
Basophils Absolute: 0.1 10*3/uL (ref 0.0–0.1)
Basophils Relative: 2 %
Eosinophils Absolute: 0.4 10*3/uL (ref 0.0–0.5)
Eosinophils Relative: 5 %
HCT: 33 % — ABNORMAL LOW (ref 36.0–46.0)
Hemoglobin: 10.8 g/dL — ABNORMAL LOW (ref 12.0–15.0)
Immature Granulocytes: 0 %
Lymphocytes Relative: 33 %
Lymphs Abs: 2.5 10*3/uL (ref 0.7–4.0)
MCH: 31.6 pg (ref 26.0–34.0)
MCHC: 32.7 g/dL (ref 30.0–36.0)
MCV: 96.5 fL (ref 80.0–100.0)
Monocytes Absolute: 0.8 10*3/uL (ref 0.1–1.0)
Monocytes Relative: 11 %
Neutro Abs: 3.7 10*3/uL (ref 1.7–7.7)
Neutrophils Relative %: 49 %
Platelets: 222 10*3/uL (ref 150–400)
RBC: 3.42 MIL/uL — ABNORMAL LOW (ref 3.87–5.11)
RDW: 13.9 % (ref 11.5–15.5)
WBC: 7.5 10*3/uL (ref 4.0–10.5)
nRBC: 0 % (ref 0.0–0.2)

## 2022-05-26 LAB — VITAMIN B12: Vitamin B-12: 809 pg/mL (ref 180–914)

## 2022-05-26 NOTE — Assessment & Plan Note (Addendum)
recommend patient to continue calcium and vitamin D supplementation.  Patient is overdue for DEXA scan and she prefers to defer at this point. Patient declined bisphosphonate treatments.

## 2022-05-26 NOTE — Progress Notes (Signed)
Hematology/Oncology Consult Note Telephone:([phone] Fax:([phone]) [phone]   REASON FOR VISIT Follow up for treatment of DCIS  ASSESSMENT & PLAN:   Cancer Staging  DCIS (ductal carcinoma in situ) of breast Staging form: Breast, AJCC 8th Edition - Clinical stage from 09/21/2016: Stage 0 (cTis (DCIS), cN0, cM0, ER+, PR+, HER2-) - Signed by Earlie Server, MD on 10/22/2016 - Pathologic stage from 10/16/2016: Stage 0 (pTis (DCIS), pN0(sn), cM0, ER+, PR+, HER2-) - Signed by Earlie Server, MD on 10/22/2016   DCIS (ductal carcinoma in situ) of breast # DCIS,.   Labs are reviewed and discussed with patient.. Patient has stopped letrozole in December 2023.  She finished close to 5 years of letrozole treatments. Recommend annual screening mammogram June 2024-patient gets mammogram ordered via gynecology oncologist office.  Osteoporosis without current pathological fracture  recommend patient to continue calcium and vitamin D supplementation.  Patient is overdue for DEXA scan and she prefers to defer at this point. Patient declined bisphosphonate treatments.  Normocytic anemia chronic Anemia, hemoglobin 10.8 Check B12, iron, TIBC ferritin.  If levels are normal, I recommend continue observation.  We discussed about the possibility disorders.  Given that her hemoglobin is stable, shared decision was made to hold off on marrow biopsy at this point.  Orders Placed This Encounter  Procedures   Iron and TIBC    Standing Status:   Future    Standing Expiration Date:   05/26/2023   Ferritin    Standing Status:   Future    Standing Expiration Date:   05/26/2023   Retic Panel    Standing Status:   Future    Standing Expiration Date:   05/26/2023   If above anemia workup was negative, patient will be discharged from oncology clinic. I recommend patient to continue follow up with primary care physician. Patient may re-establish care in the future if clinically indicated.  All questions were answered. The  patient knows to call the clinic with any problems, questions or concerns.  Earlie Server, MD, PhD [facility] Hematology Oncology 05/26/2022    HISTORY OF PRESENTING ILLNESS: Mallory Leblanc 72 y.o. 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 Patient had screening mammogram done on 08/21/2016 with [facility] healthcare system which revealed grouped calcification that approximately 1 cm in the upper outer quadrant of the left breast 9 cm from nipple which are indicated to terminate a prominent left axillary lymph node is present and is unchanged when compared to the ultrasound of 13 and 2711 studies no other dermatitis is seen in the left breast there are no suspicious masses malignant calcifications site of architecture distortion or concerning asymmetries in the right breast. Patient had diagnostic mammogram unilateral left down on September 09 2006, followed by rest left upper outer quadrant stereotactic biopsy. Pathology showed DCIS intermediate grade, calcifications associated with DCIS. DCIS is present in 4 out of 6 blocks with the largest focus measuring 6 mm. ER more than 90% positive. PR 50-90% positive.  Post lumpectomy pathology showed: DCIS status post lumpectomy. Superior margin is negative but close. 2 mm margin is recommended as it is associated with reduced risk of ipsilateral tumor recurrence. Case was discussed at breast tumor conference and consensus was patient to undergo radiation followed by adjuvant endocrine therapy for 5 years.   She is postmenopausal. Started on Letrozole 2.5mg  daily since January 2019.   # 08/27/2017 Diagnostic Mammogram showed benign findings. Will  need diagnostic mammogram in 1 year.  INTERVAL HISTORY Patient with oncology history listed above reviewed by me today presents for follow up for follow up for DCIS.  Has been taking letrozole 2.5 mg daily, since Jan 2019.   Overall she tolerates letrozole well..  Manageable side effects.  Patient has stopped letrozole in December 2023 Patient denies any new complaints.  Denies any breast concerns.  Review of Systems  Constitutional:  Negative for appetite change, chills, diaphoresis, fatigue and fever.  HENT:   Negative for hearing loss, lump/mass, nosebleeds and voice change.   Eyes:  Negative for eye problems.  Respiratory:  Negative for chest tightness, cough, hemoptysis and shortness of breath.   Cardiovascular:  Negative for chest pain and leg swelling.  Gastrointestinal:  Negative for abdominal distention, abdominal pain, blood in stool, constipation and diarrhea.  Endocrine: Negative for hot flashes.  Genitourinary:  Negative for bladder incontinence, difficulty urinating, frequency and hematuria.   Musculoskeletal:  Negative for arthralgias, back pain, flank pain and gait problem.  Skin:  Negative for itching and rash.  Neurological:  Negative for dizziness, extremity weakness, gait problem, headaches and numbness.  Hematological:  Negative for adenopathy. Does not bruise/bleed easily.  Psychiatric/Behavioral:  Negative for confusion, decreased concentration and sleep disturbance. The patient is not nervous/anxious.     MEDICAL HISTORY: Past Medical History:  Diagnosis Date   Anemia    Arthritis    LEFT KNEE   Breast cancer ([place]) 2018   Left Breast Cancer- DCIS   Genital warts    GERD (gastroesophageal reflux disease)    OCC   Hyperlipidemia    Hypothyroidism    Personal history of radiation therapy 2018   F/U left breast cancer    SURGICAL HISTORY: Past Surgical History:  Procedure Laterality Date   APPENDECTOMY     BREAST BIOPSY Left 09/21/2016   DCIS   BREAST LUMPECTOMY Left 2018   DCIS   cyst     on the left ovary early 80's   ETHMOIDECTOMY Bilateral 01/17/2019   Procedure: ETHMOIDECTOMY;  Surgeon: Clyde Canterbury, MD;  Location: [place];  Service: ENT;  Laterality:  Bilateral;   FRONTAL SINUS EXPLORATION Bilateral 01/17/2019   Procedure: FRONTAL SINUS EXPLORATION;  Surgeon: Clyde Canterbury, MD;  Location: [place];  Service: ENT;  Laterality: Bilateral;   IMAGE GUIDED SINUS SURGERY N/A 01/17/2019   Procedure: IMAGE GUIDED SINUS SURGERY;  Surgeon: Clyde Canterbury, MD;  Location: [place];  Service: ENT;  Laterality: N/A;  NEED STRYKER DISK put disk on or charge nurse desk 10-30  kp gave 2nd disk to Manuela Schwartz 11-4 kp   MAXILLARY ANTROSTOMY Bilateral 01/17/2019   Procedure: MAXILLARY ANTROSTOMY;  Surgeon: Clyde Canterbury, MD;  Location: [place];  Service: ENT;  Laterality: Bilateral;   PARTIAL MASTECTOMY WITH NEEDLE LOCALIZATION Left 10/16/2016   Procedure: PARTIAL MASTECTOMY WITH NEEDLE LOCALIZATION;  Surgeon: Leonie Green, MD;  Location: [facility];  Service: General;  Laterality: Left;   SENTINEL NODE BIOPSY Left 10/16/2016   Procedure: SENTINEL NODE BIOPSY;  Surgeon: Leonie Green, MD;  Location: [facility];  Service: General;  Laterality: Left;   SPHENOIDECTOMY Left 01/17/2019   Procedure: SPHENOIDOTOMY;  Surgeon: Clyde Canterbury, MD;  Location: [place];  Service: ENT;  Laterality: Left;   WRIST FRACTURE SURGERY Right     SOCIAL HISTORY: Social History   Socioeconomic History   Marital status: Married    Spouse name: Not on file   Number of  children: Not on file   Years of education: Not on file   Highest education level: Not on file  Occupational History   Not on file  Tobacco Use   Smoking status: Never   Smokeless tobacco: Never  Vaping Use   Vaping Use: Never used  Substance and Sexual Activity   Alcohol use: Yes    Alcohol/week: 6.0 standard drinks of alcohol    Types: 6 Cans of beer per week    Comment: BEER OCC   Drug use: No   Sexual activity: Not on file  Other Topics Concern   Not on file  Social History Narrative   Married.   1 child.    Works as a Optometrist.    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   Social Determinants of Health   Financial Resource Strain: Low Risk  (09/26/2021)   Overall Financial Resource Strain (CARDIA)    Difficulty of Paying Living Expenses: Not hard at all  Food Insecurity: No Food Insecurity (09/26/2021)   Hunger Vital Sign    Worried About Running Out of Food in the Last Year: Never true    Ran Out of Food in the Last Year: Never true  Transportation Needs: No Transportation Needs (09/26/2021)   PRAPARE - Hydrologist (Medical): No    Lack of Transportation (Non-Medical): No  Physical Activity: Insufficiently Active (09/26/2021)   Exercise Vital Sign    Days of Exercise per Week: 4 days    Minutes of Exercise per Session: 30 min  Stress: Stress Concern Present (09/26/2021)   [place]    Feeling of Stress : To some extent  Social Connections: Socially Integrated (09/26/2021)   Social Connection and Isolation Panel [NHANES]    Frequency of Communication with Friends and Family: More than three times a week    Frequency of Social Gatherings with Friends and Family: More than three times a week    Attends Religious Services: More than 4 times per year    Active Member of [employer] or Organizations: Yes    Attends Music therapist: More than 4 times per year    Marital Status: Married  Human resources officer Violence: Not At Risk (09/26/2021)   Humiliation, Afraid, Rape, and Kick questionnaire    Fear of Current or Ex-Partner: No    Emotionally Abused: No    Physically Abused: No    Sexually Abused: No    FAMILY HISTORY Family History  Problem Relation Age of Onset   Heart disease Father     ALLERGIES:  is allergic to sulfa antibiotics.  MEDICATIONS:  Current Outpatient Medications  Medication Sig Dispense Refill   acetaminophen (TYLENOL) 500 MG tablet Take 1,000 mg by mouth  every 8 (eight) hours as needed for mild pain or moderate pain.     atorvastatin (LIPITOR) 10 MG tablet TAKE 1 TABLET BY MOUTH ONCE A DAY FOR CHOLESTEROL 90 tablet 2   azelastine (ASTELIN) 0.1 % nasal spray Place 1 spray into both nostrils 2 (two) times daily. Use in each nostril as directed 30 mL 0   Cholecalciferol (D3 VITAMIN PO) Take by mouth.     diclofenac Sodium (VOLTAREN) 1 % GEL APPLY 2 GRAMS TOPICALLY 3 TIMES DAILY ASNEEDED 300 g 0   GNP VITAMIN B-12 500 MCG tablet TAKE 1 TABLET BY MOUTH ONCE A DAY 90 tablet 3   levocetirizine (XYZAL) 5 MG  tablet Take by mouth.     levothyroxine (SYNTHROID) 88 MCG tablet Take 1 tablet by mouth every morning on an empty stomach with water only.  No food or other medications for 30 minutes. 90 tablet 3   montelukast (SINGULAIR) 10 MG tablet Take by mouth.     Multiple Minerals-Vitamins (CALCIUM & VIT D3 BONE HEALTH PO) Take 1 capsule by mouth daily.     omeprazole (PRILOSEC) 10 MG capsule Take 10 mg by mouth daily.     amoxicillin-clavulanate (AUGMENTIN) 875-125 MG tablet Take 1 tablet by mouth 2 (two) times daily. (Patient not taking: Reported on 05/26/2022) 10 tablet 0   letrozole (FEMARA) 2.5 MG tablet TAKE 1 TABLET BY MOUTH ONCE A DAY (Patient not taking: Reported on 05/26/2022) 90 tablet 1   No current facility-administered medications for this visit.    PHYSICAL EXAMINATION:  ECOG PERFORMANCE STATUS: 1 - Symptomatic but completely ambulatory   Vitals:   05/26/22 0953  BP: (!) 184/97  Pulse: 68  Resp: 18  Temp: 97.7 F (36.5 C)  SpO2: 100%    Filed Weights   05/26/22 0953  Weight: 141 lb 14.4 oz (64.4 kg)     Physical Exam Constitutional:      General: She is not in acute distress.    Appearance: She is not diaphoretic.  HENT:     Head: Normocephalic and atraumatic.     Nose: Nose normal.     Mouth/Throat:     Pharynx: No oropharyngeal exudate.  Eyes:     General: No scleral icterus.    Pupils: Pupils are equal, round, and  reactive to light.  Cardiovascular:     Rate and Rhythm: Normal rate and regular rhythm.     Heart sounds: No murmur heard. Pulmonary:     Effort: Pulmonary effort is normal. No respiratory distress.     Breath sounds: No rales.  Chest:     Chest wall: No tenderness.  Abdominal:     General: There is no distension.     Palpations: Abdomen is soft.     Tenderness: There is no abdominal tenderness.  Musculoskeletal:        General: Normal range of motion.     Cervical back: Normal range of motion and neck supple.  Skin:    General: Skin is warm and dry.     Findings: No erythema.  Neurological:     Mental Status: She is alert and oriented to person, place, and time.     Cranial Nerves: No cranial nerve deficit.     Motor: No abnormal muscle tone.     Coordination: Coordination normal.  Psychiatric:        Mood and Affect: Affect normal.   Breast exam was performed in seated and lying down position. left breast with a well-healed lumpectomy surgical scar, with local fibrotic changes at the surgical site. No palpable masses in bilateral breast.  No palpable axillary lymphadenopathy bilaterally          RADIOGRAPHIC STUDIES: I have personally reviewed the radiological images as listed and agree with the findings in the report No results found.

## 2022-05-26 NOTE — Assessment & Plan Note (Addendum)
#   DCIS,.   Labs are reviewed and discussed with patient.. Patient has stopped letrozole in December 2023.  She finished close to 5 years of letrozole treatments. Recommend annual screening mammogram June 2024-patient gets mammogram ordered via gynecology oncologist office.

## 2022-05-26 NOTE — Assessment & Plan Note (Addendum)
chronic Anemia, hemoglobin 10.8 Check B12, iron, TIBC ferritin.  If levels are normal, I recommend continue observation.  We discussed about the possibility disorders.  Given that her hemoglobin is stable, shared decision was made to hold off on marrow biopsy at this point.

## 2022-05-29 ENCOUNTER — Ambulatory Visit: Payer: Medicare PPO | Admitting: Oncology

## 2022-05-29 ENCOUNTER — Other Ambulatory Visit: Payer: Medicare PPO

## 2022-06-11 ENCOUNTER — Encounter: Payer: Self-pay | Admitting: Oncology

## 2022-06-15 ENCOUNTER — Other Ambulatory Visit: Payer: Self-pay | Admitting: Primary Care

## 2022-06-15 DIAGNOSIS — E785 Hyperlipidemia, unspecified: Secondary | ICD-10-CM

## 2022-06-25 DIAGNOSIS — J300 Vasomotor rhinitis: Secondary | ICD-10-CM | POA: Diagnosis not present

## 2022-06-25 DIAGNOSIS — H1045 Other chronic allergic conjunctivitis: Secondary | ICD-10-CM | POA: Diagnosis not present

## 2022-06-25 DIAGNOSIS — J339 Nasal polyp, unspecified: Secondary | ICD-10-CM | POA: Diagnosis not present

## 2022-07-15 ENCOUNTER — Ambulatory Visit: Payer: Medicare PPO | Admitting: Family Medicine

## 2022-07-15 VITALS — BP 130/80 | HR 70 | Temp 98.8°F | Ht 59.0 in | Wt 143.1 lb

## 2022-07-15 DIAGNOSIS — M25571 Pain in right ankle and joints of right foot: Secondary | ICD-10-CM

## 2022-07-15 NOTE — Progress Notes (Signed)
Mallory Leblanc T. Mallory Robideau, MD, CAQ Sports Medicine [facility] at [facility] [address] [place], [address]  Phone: [phone]  FAX: [phone]  Mallory Leblanc - 72 y.o. female  MRN [id number]  Date of Birth: 12-13-1950  Date: 07/15/2022  PCP: Doreene Nest, NP  Referral: Doreene Nest, NP  Chief Complaint  Patient presents with   Joint Swelling    Right Ankle   Subjective:   Mallory Leblanc is a 72 y.o. very pleasant female patient with Body mass index is 28.91 kg/m. who presents with the following:  She presents with right-sided ankle pain and swelling.  Fairly recently, twisted - a couple of weeks ago.  She had an inversion injury 2 weeks ago.  Since then she has been wearing an ASO ankle brace with some good protection.  She is also been using some Voltaren gel on the ankle.  She denies pain in the tibia, fibula, malleoli, or the forefoot.  Isolated to the lateral ankle.   Review of Systems is noted in the HPI, as appropriate  Objective:   BP 130/80 (BP Location: Left Arm, Patient Position: Sitting, Cuff Size: Normal)   Pulse 70   Temp 98.8 F (37.1 C) (Temporal)   Ht 4\' 11"  (1.499 m)   Wt 143 lb 2 oz (64.9 kg)   SpO2 98%   BMI 28.91 kg/m   GEN: No acute distress; alert,appropriate. PULM: Breathing comfortably in no respiratory distress PSYCH: Normally interactive.    ANKLE: R Echymosis: no Edema: There is some modest lateral swelling ROM: Full dorsi and plantar flexion, inversion, eversion Gait: heel toe, non-antalgic Lateral Mall: NT Medial Mall: NT Talus: NT Navicular: NT Cuboid: NT Calcaneous: NT Metatarsals: NT 5th MT: NT Phalanges: NT Achilles: NT Plantar Fascia: NT Fat Pad: NT Peroneals: NT Post Tib: NT Great Toe: Nml motion Ant Drawer: neg Talar Tilt: neg ATFL: NT CFL: NT Deltoid: NT Str: 5/5 Other Special tests: none Sensation: intact   Laboratory and Imaging Data:  Assessment and Plan:      ICD-10-CM   1. Acute right ankle pain  M25.571      Grade 1 ATFL sprain.  Recovering well, she is no longer in any kind of supportive device.  Think this will slowly continue to improve over the next few weeks.  No concern for bony derangement, [place] ankle rules.  Never really had any significant bony pain.  No bruising.  Continue with day-to-day activities.  If she is up on her feet a lot doing a lot of activity such as yard work, she can continue to wear ASO ankle brace.  Disposition: No follow-ups on file.  Dragon [employer] One speech-to-text software was used for transcription in this dictation.  Possible transcriptional errors can occur using Animal nutritionist.   Signed,  Elpidio Galea. Liane Tribbey, MD   Outpatient Encounter Medications as of 07/15/2022  Medication Sig   acetaminophen (TYLENOL) 500 MG tablet Take 1,000 mg by mouth every 8 (eight) hours as needed for mild pain or moderate pain.   atorvastatin (LIPITOR) 10 MG tablet TAKE ONE TABLET BY MOUTH ONCE A DAY FOR CHOLESTEROL   azelastine (ASTELIN) 0.1 % nasal spray Place 1 spray into both nostrils 2 (two) times daily. Use in each nostril as directed   Cholecalciferol (D3 VITAMIN PO) Take by mouth.   diclofenac Sodium (VOLTAREN) 1 % GEL APPLY 2 GRAMS TOPICALLY 3 TIMES DAILY ASNEEDED   GNP VITAMIN B-12 500 MCG tablet TAKE  1 TABLET BY MOUTH ONCE A DAY   levocetirizine (XYZAL) 5 MG tablet Take by mouth.   levothyroxine (SYNTHROID) 88 MCG tablet Take 1 tablet by mouth every morning on an empty stomach with water only.  No food or other medications for 30 minutes.   montelukast (SINGULAIR) 10 MG tablet Take by mouth.   Multiple Minerals-Vitamins (CALCIUM & VIT D3 BONE HEALTH PO) Take 1 capsule by mouth daily.   omeprazole (PRILOSEC) 10 MG capsule Take 10 mg by mouth daily.   [DISCONTINUED] amoxicillin-clavulanate (AUGMENTIN) 875-125 MG tablet Take 1 tablet by mouth 2 (two) times daily. (Patient not taking: Reported on 05/26/2022)    [DISCONTINUED] letrozole (FEMARA) 2.5 MG tablet TAKE 1 TABLET BY MOUTH ONCE A DAY (Patient not taking: Reported on 05/26/2022)   No facility-administered encounter medications on file as of 07/15/2022.

## 2022-07-16 ENCOUNTER — Other Ambulatory Visit: Payer: Self-pay | Admitting: Primary Care

## 2022-07-16 DIAGNOSIS — G8929 Other chronic pain: Secondary | ICD-10-CM

## 2022-07-16 DIAGNOSIS — E039 Hypothyroidism, unspecified: Secondary | ICD-10-CM

## 2022-07-16 NOTE — Telephone Encounter (Signed)
Patient is due for CPE/follow up in mid August, this will be required prior to any further refills.  Please schedule, thank you!

## 2022-07-16 NOTE — Telephone Encounter (Signed)
Patient scheduled.

## 2022-07-28 ENCOUNTER — Ambulatory Visit: Payer: Medicare PPO | Admitting: Internal Medicine

## 2022-07-28 ENCOUNTER — Encounter: Payer: Self-pay | Admitting: Internal Medicine

## 2022-07-28 VITALS — BP 138/84 | HR 74 | Temp 97.9°F | Ht 59.0 in | Wt 144.0 lb

## 2022-07-28 DIAGNOSIS — T7840XA Allergy, unspecified, initial encounter: Secondary | ICD-10-CM | POA: Insufficient documentation

## 2022-07-28 MED ORDER — PREDNISONE 20 MG PO TABS: 40.0000 mg | ORAL_TABLET | Freq: Every day | ORAL | 0 refills | Status: DC

## 2022-07-28 NOTE — Progress Notes (Signed)
Subjective:    Patient ID: Mallory Leblanc, female    DOB: 1950-09-02, 72 y.o.   MRN: [id number]  HPI Here due to rash on arms and legs  Has on both legs and arms Was helping husband working in the yard about 10 days ago Did wear gloves--but was wearing shorts Only started about 5-6 days later and worsened in a couple of days OTC cortisone cream some help Taking benedryl  Has itching and some burning as well  Current Outpatient Medications on File Prior to Visit  Medication Sig Dispense Refill   acetaminophen (TYLENOL) 500 MG tablet Take 1,000 mg by mouth every 8 (eight) hours as needed for mild pain or moderate pain.     atorvastatin (LIPITOR) 10 MG tablet TAKE ONE TABLET BY MOUTH ONCE A DAY FOR CHOLESTEROL 90 tablet 0   azelastine (ASTELIN) 0.1 % nasal spray Place 1 spray into both nostrils 2 (two) times daily. Use in each nostril as directed 30 mL 0   Cholecalciferol (D3 VITAMIN PO) Take by mouth.     diclofenac Sodium (VOLTAREN) 1 % GEL APPLY TWO GRAMS TOPICALLY THREE TIMES DAILY AS NEEDED 300 g 0   GNP VITAMIN B-12 500 MCG tablet TAKE 1 TABLET BY MOUTH ONCE A DAY 90 tablet 3   levocetirizine (XYZAL) 5 MG tablet Take by mouth.     levothyroxine (SYNTHROID) 88 MCG tablet TAKE ONE TAB BY MOUTH ONCE DAILY. TAKE ON AN EMPTY STOMACH WITH A GLASS OF WATER ATLEAST 30-60 MINUTES BEFORE BREAKFAST 90 tablet 0   montelukast (SINGULAIR) 10 MG tablet Take by mouth.     Multiple Minerals-Vitamins (CALCIUM & VIT D3 BONE HEALTH PO) Take 1 capsule by mouth daily.     omeprazole (PRILOSEC) 10 MG capsule Take 10 mg by mouth daily.     No current facility-administered medications on file prior to visit.    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   Genital warts    GERD (gastroesophageal reflux disease)    OCC   Hyperlipidemia     Hypothyroidism    Personal history of radiation therapy 2018   F/U left breast cancer    Past Surgical History:  Procedure Laterality Date   APPENDECTOMY     BREAST BIOPSY Left 09/21/2016   DCIS   BREAST LUMPECTOMY Left 2018   DCIS   cyst     on the left ovary early 80's   ETHMOIDECTOMY Bilateral 01/17/2019   Procedure: ETHMOIDECTOMY;  Surgeon: Geanie Logan, MD;  Location: [facility];  Service: ENT;  Laterality: Bilateral;   FRONTAL SINUS EXPLORATION Bilateral 01/17/2019   Procedure: FRONTAL SINUS EXPLORATION;  Surgeon: Geanie Logan, MD;  Location: [facility];  Service: ENT;  Laterality: Bilateral;   IMAGE GUIDED SINUS SURGERY N/A 01/17/2019   Procedure: IMAGE GUIDED SINUS SURGERY;  Surgeon: Geanie Logan, MD;  Location: [facility];  Service: ENT;  Laterality: N/A;  NEED STRYKER DISK put disk on or charge nurse desk 10-30  kp gave 2nd disk to Darl Pikes 11-4 kp   MAXILLARY ANTROSTOMY Bilateral 01/17/2019   Procedure: MAXILLARY ANTROSTOMY;  Surgeon: Geanie Logan, MD;  Location: [facility];  Service: ENT;  Laterality: Bilateral;   PARTIAL MASTECTOMY WITH NEEDLE LOCALIZATION Left 10/16/2016   Procedure: PARTIAL MASTECTOMY WITH NEEDLE  LOCALIZATION;  Surgeon: Nadeen Landau, MD;  Location: [facility];  Service: General;  Laterality: Left;   SENTINEL NODE BIOPSY Left 10/16/2016   Procedure: SENTINEL NODE BIOPSY;  Surgeon: Nadeen Landau, MD;  Location: [facility];  Service: General;  Laterality: Left;   SPHENOIDECTOMY Left 01/17/2019   Procedure: SPHENOIDOTOMY;  Surgeon: Geanie Logan, MD;  Location: [facility];  Service: ENT;  Laterality: Left;   WRIST FRACTURE SURGERY Right     Family History  Problem Relation Age of Onset   Heart disease Father     Social History   Socioeconomic History   Marital status: Married    Spouse name: Not on file   Number of children: Not on file   Years of education: Not on file   Highest education  level: 12th grade  Occupational History   Not on file  Tobacco Use   Smoking status: Never   Smokeless tobacco: Never  Vaping Use   Vaping Use: Never used  Substance and Sexual Activity   Alcohol use: Yes    Alcohol/week: 6.0 standard drinks of alcohol    Types: 6 Cans of beer per week    Comment: BEER OCC   Drug use: No   Sexual activity: Not on file  Other Topics Concern   Not on file  Social History Narrative   Married.   1 child.    Works as a Architectural technologist.   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   Social Determinants of Health   Financial Resource Strain: Low Risk  (07/15/2022)   Overall Financial Resource Strain (CARDIA)    Difficulty of Paying Living Expenses: Not hard at all  Food Insecurity: No Food Insecurity (07/15/2022)   Hunger Vital Sign    Worried About Running Out of Food in the Last Year: Never true    Ran Out of Food in the Last Year: Never true  Transportation Needs: No Transportation Needs (07/15/2022)   PRAPARE - Administrator, Civil Service (Medical): No    Lack of Transportation (Non-Medical): No  Physical Activity: Insufficiently Active (07/15/2022)   Exercise Vital Sign    Days of Exercise per Week: 4 days    Minutes of Exercise per Session: 30 min  Stress: No Stress Concern Present (07/15/2022)   [employer] of Occupational Health - Occupational Stress Questionnaire    Feeling of Stress : Only a little  Social Connections: Socially Integrated (07/15/2022)   Social Connection and Isolation Panel [NHANES]    Frequency of Communication with Friends and Family: More than three times a week    Frequency of Social Gatherings with Friends and Family: More than three times a week    Attends Religious Services: More than 4 times per year    Active Member of [employer] or Organizations: Yes    Attends Engineer, structural: More than 4 times per year    Marital Status: Married  Catering manager  Violence: Not At Risk (09/26/2021)   Humiliation, Afraid, Rape, and Kick questionnaire    Fear of Current or Ex-Partner: No    Emotionally Abused: No    Physically Abused: No    Sexually Abused: No   Review of Systems No mouth swelling No dysphagia    Objective:   Physical Exam Constitutional:      Appearance: Normal appearance.  Skin:    Comments: Generalized rash---palpable redness papules on legs and arms  Neurological:  Mental Status: She is alert.            Assessment & Plan:

## 2022-07-28 NOTE — Assessment & Plan Note (Signed)
May have had exposure in the yard---but now with mild generalized reaction (on spectrum of erythema multiforme but not that severe) Will give prednisone 40 x 5, 20 x 5 Benedryl only at night Xyzal once or twice a day

## 2022-07-29 ENCOUNTER — Ambulatory Visit: Payer: Medicare PPO | Admitting: Family Medicine

## 2022-07-31 ENCOUNTER — Ambulatory Visit: Payer: Medicare PPO | Admitting: Nurse Practitioner

## 2022-07-31 ENCOUNTER — Encounter: Payer: Self-pay | Admitting: Nurse Practitioner

## 2022-07-31 VITALS — BP 128/70 | HR 76 | Temp 98.8°F | Ht 59.0 in | Wt 144.0 lb

## 2022-07-31 DIAGNOSIS — R21 Rash and other nonspecific skin eruption: Secondary | ICD-10-CM

## 2022-07-31 DIAGNOSIS — T7840XD Allergy, unspecified, subsequent encounter: Secondary | ICD-10-CM

## 2022-07-31 NOTE — Progress Notes (Unsigned)
Bethanie Dicker, NP-C Phone: [phone]  Mallory Leblanc is a 72 y.o. female who presents today for rash.   Patient was evaluated on 07/28/2022 for a minor allergic reaction. She presented with a rash on bilateral arms and legs. She was started on Prednisone and advised to take Xyzal in the mornings and Benadryl at bedtime. She presents today because the rash has not completely resolved. She is on Day 4 of 10 on Prednisone. She has been taking Xyzal and Benadryl as directed. The rash has improved some as it is no longer on her legs and has lessened on her arms. She does have mild itching and burning.   Social History   Tobacco Use  Smoking Status Never  Smokeless Tobacco Never    Current Outpatient Medications on File Prior to Visit  Medication Sig Dispense Refill   acetaminophen (TYLENOL) 500 MG tablet Take 1,000 mg by mouth every 8 (eight) hours as needed for mild pain or moderate pain.     atorvastatin (LIPITOR) 10 MG tablet TAKE ONE TABLET BY MOUTH ONCE A DAY FOR CHOLESTEROL 90 tablet 0   azelastine (ASTELIN) 0.1 % nasal spray Place 1 spray into both nostrils 2 (two) times daily. Use in each nostril as directed 30 mL 0   Cholecalciferol (D3 VITAMIN PO) Take by mouth.     diclofenac Sodium (VOLTAREN) 1 % GEL APPLY TWO GRAMS TOPICALLY THREE TIMES DAILY AS NEEDED 300 g 0   GNP VITAMIN B-12 500 MCG tablet TAKE 1 TABLET BY MOUTH ONCE A DAY 90 tablet 3   levocetirizine (XYZAL) 5 MG tablet Take by mouth.     levothyroxine (SYNTHROID) 88 MCG tablet TAKE ONE TAB BY MOUTH ONCE DAILY. TAKE ON AN EMPTY STOMACH WITH A GLASS OF WATER ATLEAST 30-60 MINUTES BEFORE BREAKFAST 90 tablet 0   montelukast (SINGULAIR) 10 MG tablet Take by mouth.     Multiple Minerals-Vitamins (CALCIUM & VIT D3 BONE HEALTH PO) Take 1 capsule by mouth daily.     omeprazole (PRILOSEC) 10 MG capsule Take 10 mg by mouth daily.     predniSONE (DELTASONE) 20 MG tablet Take 2 tablets (40 mg total) by mouth daily. For 5 days and then 1  tab daily for 5 days 15 tablet 0   No current facility-administered medications on file prior to visit.    ROS see history of present illness  Objective  Physical Exam Vitals:   07/31/22 1604  BP: 128/70  Pulse: 76  Temp: 98.8 F (37.1 C)  SpO2: 97%    BP Readings from Last 3 Encounters:  07/31/22 128/70  07/28/22 138/84  07/15/22 130/80   Wt Readings from Last 3 Encounters:  07/31/22 144 lb (65.3 kg)  07/28/22 144 lb (65.3 kg)  07/15/22 143 lb 2 oz (64.9 kg)    Physical Exam Constitutional:      General: She is not in acute distress.    Appearance: Normal appearance.  HENT:     Head: Normocephalic.  Cardiovascular:     Rate and Rhythm: Normal rate and regular rhythm.     Heart sounds: Normal heart sounds.  Pulmonary:     Effort: Pulmonary effort is normal.     Breath sounds: Normal breath sounds.  Skin:    General: Skin is warm and dry.     Findings: Rash (bilateral arms) present.  Neurological:     General: No focal deficit present.     Mental Status: She is alert.  Psychiatric:  Mood and Affect: Mood normal.        Behavior: Behavior normal.      Assessment/Plan: Please see individual problem list.  Minor allergic reaction, subsequent encounter Assessment & Plan: Improvement in rash, but not completely resolved. Advised patient to continue her Prednisone as directed until complete and continue Xyzal every morning and Benadryl at bedtime. Return precautions given to patient.     Return if symptoms worsen or fail to improve.   Bethanie Dicker, NP-C [facility] - [employer]

## 2022-08-05 NOTE — Assessment & Plan Note (Signed)
Improvement in rash, but not completely resolved. Advised patient to continue her Prednisone as directed until complete and continue Xyzal every morning and Benadryl at bedtime. Return precautions given to patient.

## 2022-08-09 NOTE — Progress Notes (Unsigned)
    Mallory Tonne T. Loleta Frommelt, MD, CAQ Sports Medicine [facility] at [facility] [address] [place], [address]  Phone: [phone]  FAX: [phone]  Mallory Leblanc - 72 y.o. female  MRN [id number]  Date of Birth: 08/18/1950  Date: 08/10/2022  PCP: Mallory Nest, NP  Referral: Mallory Nest, NP  No chief complaint on file.  Subjective:   Mallory Leblanc is a 72 y.o. very pleasant female patient with There is no height or weight on file to calculate BMI. who presents with the following:  She is a very pleasant lady, and have seen her numerous times over the years for various musculoskeletal complaints.  Almost 2 weeks ago, she presented with a rash see Dr. Alphonsus Leblanc, and he felt like this was likely a topical reaction with them for a systemic component.  She was on some prednisone for 10 days as well as Xyzal and Benadryl at nighttime.  She had some questions about this in the middle of her treatment, she saw nurse practitioner in [place] on Jul 31, 2022.    Review of Systems is noted in the HPI, as appropriate  Objective:   There were no vitals taken for this visit.  GEN: No acute distress; alert,appropriate. PULM: Breathing comfortably in no respiratory distress PSYCH: Normally interactive.   Laboratory and Imaging Data:  Assessment and Plan:   ***

## 2022-08-10 ENCOUNTER — Ambulatory Visit: Payer: Medicare PPO | Admitting: Family Medicine

## 2022-08-10 ENCOUNTER — Encounter: Payer: Self-pay | Admitting: Family Medicine

## 2022-08-10 VITALS — BP 134/70 | HR 70 | Temp 98.1°F | Ht 59.0 in | Wt 146.2 lb

## 2022-08-10 DIAGNOSIS — R21 Rash and other nonspecific skin eruption: Secondary | ICD-10-CM

## 2022-09-14 ENCOUNTER — Other Ambulatory Visit: Payer: Self-pay | Admitting: Obstetrics and Gynecology

## 2022-09-14 DIAGNOSIS — Z1231 Encounter for screening mammogram for malignant neoplasm of breast: Secondary | ICD-10-CM

## 2022-09-15 ENCOUNTER — Other Ambulatory Visit: Payer: Self-pay | Admitting: Primary Care

## 2022-09-15 DIAGNOSIS — E785 Hyperlipidemia, unspecified: Secondary | ICD-10-CM

## 2022-09-18 DIAGNOSIS — R21 Rash and other nonspecific skin eruption: Secondary | ICD-10-CM

## 2022-09-22 ENCOUNTER — Encounter: Payer: Self-pay | Admitting: *Deleted

## 2022-09-29 DIAGNOSIS — Z124 Encounter for screening for malignant neoplasm of cervix: Secondary | ICD-10-CM | POA: Diagnosis not present

## 2022-09-29 DIAGNOSIS — Z1331 Encounter for screening for depression: Secondary | ICD-10-CM | POA: Diagnosis not present

## 2022-10-06 DIAGNOSIS — Z1211 Encounter for screening for malignant neoplasm of colon: Secondary | ICD-10-CM | POA: Diagnosis not present

## 2022-10-07 ENCOUNTER — Encounter (INDEPENDENT_AMBULATORY_CARE_PROVIDER_SITE_OTHER): Payer: Self-pay

## 2022-10-19 ENCOUNTER — Ambulatory Visit
Admission: RE | Admit: 2022-10-19 | Discharge: 2022-10-19 | Disposition: A | Discharge status: Home / Self Care | Payer: Medicare PPO | Source: Ambulatory Visit | Attending: Obstetrics and Gynecology | Admitting: Obstetrics and Gynecology

## 2022-10-19 DIAGNOSIS — Z1231 Encounter for screening mammogram for malignant neoplasm of breast: Secondary | ICD-10-CM | POA: Insufficient documentation

## 2022-10-20 ENCOUNTER — Ambulatory Visit (INDEPENDENT_AMBULATORY_CARE_PROVIDER_SITE_OTHER): Payer: Medicare PPO | Admitting: Primary Care

## 2022-10-20 ENCOUNTER — Encounter: Payer: Self-pay | Admitting: Primary Care

## 2022-10-20 VITALS — BP 134/78 | HR 70 | Temp 97.9°F | Ht 59.0 in | Wt 148.2 lb

## 2022-10-20 DIAGNOSIS — M810 Age-related osteoporosis without current pathological fracture: Secondary | ICD-10-CM | POA: Diagnosis not present

## 2022-10-20 DIAGNOSIS — E039 Hypothyroidism, unspecified: Secondary | ICD-10-CM | POA: Diagnosis not present

## 2022-10-20 DIAGNOSIS — D0512 Intraductal carcinoma in situ of left breast: Secondary | ICD-10-CM | POA: Diagnosis not present

## 2022-10-20 DIAGNOSIS — E785 Hyperlipidemia, unspecified: Secondary | ICD-10-CM

## 2022-10-20 DIAGNOSIS — Z0001 Encounter for general adult medical examination with abnormal findings: Secondary | ICD-10-CM

## 2022-10-20 DIAGNOSIS — K219 Gastro-esophageal reflux disease without esophagitis: Secondary | ICD-10-CM

## 2022-10-20 DIAGNOSIS — R7303 Prediabetes: Secondary | ICD-10-CM

## 2022-10-20 DIAGNOSIS — D649 Anemia, unspecified: Secondary | ICD-10-CM | POA: Diagnosis not present

## 2022-10-20 DIAGNOSIS — Z0000 Encounter for general adult medical examination without abnormal findings: Secondary | ICD-10-CM

## 2022-10-20 DIAGNOSIS — E2839 Other primary ovarian failure: Secondary | ICD-10-CM | POA: Diagnosis not present

## 2022-10-20 DIAGNOSIS — J309 Allergic rhinitis, unspecified: Secondary | ICD-10-CM

## 2022-10-20 DIAGNOSIS — J019 Acute sinusitis, unspecified: Secondary | ICD-10-CM | POA: Insufficient documentation

## 2022-10-20 LAB — LIPID PANEL
Cholesterol: 158 mg/dL (ref 0–200)
HDL: 51.8 mg/dL (ref >39.00)
NonHDL: 105.92
Total CHOL/HDL Ratio: 3
Triglycerides: 203 mg/dL — ABNORMAL HIGH (ref 0.0–149.0)
VLDL: 40.6 mg/dL — ABNORMAL HIGH (ref 0.0–40.0)

## 2022-10-20 LAB — LDL CHOLESTEROL, DIRECT: Direct LDL: 83 mg/dL

## 2022-10-20 LAB — TSH: TSH: 1.57 u[IU]/mL (ref 0.35–5.50)

## 2022-10-20 LAB — HEMOGLOBIN A1C: Hgb A1c MFr Bld: 6.2 % (ref 4.6–6.5)

## 2022-10-20 MED ORDER — LEVOTHYROXINE SODIUM 88 MCG PO TABS
ORAL_TABLET | ORAL | 3 refills | Status: DC
Start: 2022-10-20 — End: 2024-01-09

## 2022-10-20 MED ORDER — AMOXICILLIN-POT CLAVULANATE 875-125 MG PO TABS
1.0000 | ORAL_TABLET | Freq: Two times a day (BID) | ORAL | 0 refills | Status: DC
Start: 2022-10-20 — End: 2023-02-01

## 2022-10-20 NOTE — Patient Instructions (Signed)
Stop by the lab prior to leaving today. I will notify you of your results once received.   Call [facility] to schedule your bone density scan.   Start Augmentin antibiotics. Take 1 tablet by mouth twice daily for 7 days.  It was a pleasure to see you today!

## 2022-10-20 NOTE — Assessment & Plan Note (Signed)
Following with hematology, office notes and labs reviewed from March 2024. Continue watchful waiting.

## 2022-10-20 NOTE — Assessment & Plan Note (Signed)
Controlled. Following with allergist.  Continue montelukast 10 mg at bedtime, Xyzal 5 mg as needed, Astelin nasal spray as needed.

## 2022-10-20 NOTE — Assessment & Plan Note (Addendum)
Following with oncology/hematology, office notes and labs reviewed from March 2024. Bone density scan ordered and pending.

## 2022-10-20 NOTE — Assessment & Plan Note (Signed)
Repeat A1C pending.  Discussed the importance of a healthy diet and regular exercise in order for weight loss, and to reduce the risk of further co-morbidity.  

## 2022-10-20 NOTE — Assessment & Plan Note (Signed)
HPI suggestive of bacterial involvement at this point.  Treat with Augmentin 875-125 mg twice daily x 7 days. Continue nasal sprays, saline rinses, antihistamine daily.

## 2022-10-20 NOTE — Assessment & Plan Note (Signed)
Repeat lipid panel pending.  Discussed the importance of a healthy diet and regular exercise in order for weight loss, and to reduce the risk of further co-morbidity. Continue atorvastatin 10 mg daily.  

## 2022-10-20 NOTE — Assessment & Plan Note (Signed)
Following with hematology/oncology, office notes and labs reviewed from March 2024. Mammogram up-to-date.  Remain off letrozole.

## 2022-10-20 NOTE — Assessment & Plan Note (Signed)
Controlled.  Continue omeprazole 10 mg as needed.

## 2022-10-20 NOTE — Assessment & Plan Note (Signed)
Immunizations UTD. Bone density scan due, orders placed.  Mammogram UTD Colon cancer screening UTD, due 2026  Discussed the importance of a healthy diet and regular exercise in order for weight loss, and to reduce the risk of further co-morbidity.  Exam stable. Labs pending.  Follow up in 1 year for repeat physical.

## 2022-10-20 NOTE — Progress Notes (Signed)
Subjective:    Patient ID: Mallory Leblanc, female    DOB: 1950/04/05, 72 y.o.   MRN: [id number]  HPI  Mallory Leblanc is a very pleasant 72 y.o. female who presents today for complete physical and follow up of chronic conditions.  She would also like to discuss chronic sinus pressure. Historically located in between her eyes. About 2-3 weeks ago her symptoms began again. She is expelling green mucous from her nasal cavity. History of sinus surgery in 2020. Following with allergist now, managed on Astelin nasal spray, Singulair 10 mg HS, Xyzal 5 mg PRN. She is compliant to all medications, also now with saline nasal rinses.   Immunizations: -Shingles: Completed Shingrix series -Pneumonia: Completed Prevnar 20 in 2022, Pneumovax 23 in 2023  Diet: Fair diet.  Exercise: No regular exercise.  Eye exam: Completes annually  Dental exam: Completes semi-annually    Mammogram: Completed yesterday Bone Density Scan: Completed in May 2023  Colonoscopy: Completed stool card in July 2024, negative.  Completed Cologuard in 2023, negative.       Review of Systems  Constitutional:  Negative for fever and unexpected weight change.  HENT:  Positive for congestion, sinus pressure and sinus pain.   Respiratory:  Negative for cough and shortness of breath.   Cardiovascular:  Negative for chest pain.  Gastrointestinal:  Negative for constipation and diarrhea.  Genitourinary:  Negative for difficulty urinating and menstrual problem.  Musculoskeletal:  Negative for arthralgias and myalgias.  Skin:  Negative for rash.  Allergic/Immunologic: Negative for environmental allergies.  Neurological:  Positive for headaches. Negative for dizziness and numbness.         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   Genital warts    GERD (gastroesophageal reflux disease)    OCC   Hyperlipidemia    Hypothyroidism     Personal history of radiation therapy 2018   F/U left breast cancer    Social History   Socioeconomic History   Marital status: Married    Spouse name: Not on file   Number of children: Not on file   Years of education: Not on file   Highest education level: 12th grade  Occupational History   Not on file  Tobacco Use   Smoking status: Never   Smokeless tobacco: Never  Vaping Use   Vaping status: Never Used  Substance and Sexual Activity   Alcohol use: Yes    Alcohol/week: 6.0 standard drinks of alcohol    Types: 6 Cans of beer per week    Comment: BEER OCC   Drug use: No   Sexual activity: Not on file  Other Topics Concern   Not on file  Social History Narrative   Married.   1 child.    Works as a Architectural technologist.   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   Social Determinants of Health   Financial Resource Strain: Low Risk  (07/15/2022)   Overall Financial Resource Strain (CARDIA)    Difficulty of Paying Living Expenses: Not hard at all  Food Insecurity: No Food Insecurity (07/15/2022)   Hunger Vital Sign    Worried About Running Out of Food in the Last Year: Never true    Ran Out of Food in the Last Year: Never true  Transportation Needs: No Transportation Needs (07/15/2022)   PRAPARE -  Administrator, Civil Service (Medical): No    Lack of Transportation (Non-Medical): No  Physical Activity: Insufficiently Active (07/15/2022)   Exercise Vital Sign    Days of Exercise per Week: 4 days    Minutes of Exercise per Session: 30 min  Stress: No Stress Concern Present (07/15/2022)   [employer] of Occupational Health - Occupational Stress Questionnaire    Feeling of Stress : Only a little  Social Connections: Socially Integrated (07/15/2022)   Social Connection and Isolation Panel [NHANES]    Frequency of Communication with Friends and Family: More than three times a week    Frequency of Social Gatherings with Friends  and Family: More than three times a week    Attends Religious Services: More than 4 times per year    Active Member of Clubs or Organizations: Yes    Attends Banker Meetings: More than 4 times per year    Marital Status: Married  Catering manager Violence: Not At Risk (09/26/2021)   Humiliation, Afraid, Rape, and Kick questionnaire    Fear of Current or Ex-Partner: No    Emotionally Abused: No    Physically Abused: No    Sexually Abused: No    Past Surgical History:  Procedure Laterality Date   APPENDECTOMY     BREAST BIOPSY Left 09/21/2016   DCIS   BREAST LUMPECTOMY Left 2018   DCIS   cyst     on the left ovary early 80's   ETHMOIDECTOMY Bilateral 01/17/2019   Procedure: ETHMOIDECTOMY;  Surgeon: Geanie Logan, MD;  Location: [facility];  Service: ENT;  Laterality: Bilateral;   FRONTAL SINUS EXPLORATION Bilateral 01/17/2019   Procedure: FRONTAL SINUS EXPLORATION;  Surgeon: Geanie Logan, MD;  Location: [facility];  Service: ENT;  Laterality: Bilateral;   IMAGE GUIDED SINUS SURGERY N/A 01/17/2019   Procedure: IMAGE GUIDED SINUS SURGERY;  Surgeon: Geanie Logan, MD;  Location: [facility];  Service: ENT;  Laterality: N/A;  NEED STRYKER DISK put disk on or charge nurse desk 10-30  kp gave 2nd disk to Darl Pikes 11-4 kp   MAXILLARY ANTROSTOMY Bilateral 01/17/2019   Procedure: MAXILLARY ANTROSTOMY;  Surgeon: Geanie Logan, MD;  Location: [facility];  Service: ENT;  Laterality: Bilateral;   PARTIAL MASTECTOMY WITH NEEDLE LOCALIZATION Left 10/16/2016   Procedure: PARTIAL MASTECTOMY WITH NEEDLE LOCALIZATION;  Surgeon: Nadeen Landau, MD;  Location: [facility];  Service: General;  Laterality: Left;   SENTINEL NODE BIOPSY Left 10/16/2016   Procedure: SENTINEL NODE BIOPSY;  Surgeon: Nadeen Landau, MD;  Location: [facility];  Service: General;  Laterality: Left;   SPHENOIDECTOMY Left 01/17/2019   Procedure: SPHENOIDOTOMY;  Surgeon: Geanie Logan, MD;  Location: [facility];  Service: ENT;  Laterality: Left;   WRIST FRACTURE SURGERY Right     Family History  Problem Relation Age of Onset   Heart disease Father     Allergies  Allergen Reactions   Sulfa Antibiotics Rash    Current Outpatient Medications on File Prior to Visit  Medication Sig Dispense Refill   acetaminophen (TYLENOL) 500 MG tablet Take 1,000 mg by mouth every 8 (eight) hours as needed for mild pain or moderate pain.     atorvastatin (LIPITOR) 10 MG tablet TAKE ONE TABLET BY MOUTH ONCE A DAY FOR CHOLESTEROL 90 tablet 0   azelastine (ASTELIN) 0.1 % nasal spray Place 1 spray into both nostrils 2 (two) times daily. Use in each nostril as directed 30 mL  0   azelastine (OPTIVAR) 0.05 % ophthalmic solution Place 1 drop into both eyes daily.     Cholecalciferol (D3 VITAMIN PO) Take by mouth.     Cyanocobalamin (B-12) 3000 MCG CAPS Take 1 capsule by mouth daily.     diclofenac Sodium (VOLTAREN) 1 % GEL APPLY TWO GRAMS TOPICALLY THREE TIMES DAILY AS NEEDED 300 g 0   ferrous sulfate 325 (65 FE) MG tablet Take 325 mg by mouth daily with breakfast.     levocetirizine (XYZAL) 5 MG tablet Take by mouth.     montelukast (SINGULAIR) 10 MG tablet Take by mouth.     Multiple Minerals-Vitamins (CALCIUM & VIT D3 BONE HEALTH PO) Take 1 capsule by mouth daily.     omeprazole (PRILOSEC) 10 MG capsule Take 10 mg by mouth daily.     No current facility-administered medications on file prior to visit.    BP 134/78   Pulse 70   Temp 97.9 F (36.6 C) (Temporal)   Ht 4\' 11"  (1.499 m)   Wt 148 lb 3.2 oz (67.2 kg)   SpO2 99%   BMI 29.93 kg/m  Objective:   Physical Exam HENT:     Right Ear: Tympanic membrane and ear canal normal.     Left Ear: Tympanic membrane and ear canal normal.     Nose: Nose normal.  Eyes:     Conjunctiva/sclera: Conjunctivae normal.     Pupils: Pupils are equal, round, and reactive to light.  Neck:     Thyroid: No thyromegaly.   Cardiovascular:     Rate and Rhythm: Normal rate and regular rhythm.     Heart sounds: No murmur heard. Pulmonary:     Effort: Pulmonary effort is normal.     Breath sounds: Normal breath sounds. No rales.  Abdominal:     General: Bowel sounds are normal.     Palpations: Abdomen is soft.     Tenderness: There is no abdominal tenderness.  Musculoskeletal:        General: Normal range of motion.     Cervical back: Neck supple.  Lymphadenopathy:     Cervical: No cervical adenopathy.  Skin:    General: Skin is warm and dry.     Findings: No rash.  Neurological:     Mental Status: She is alert and oriented to person, place, and time.     Cranial Nerves: No cranial nerve deficit.     Deep Tendon Reflexes: Reflexes are normal and symmetric.  Psychiatric:        Mood and Affect: Mood normal.           Assessment & Plan:  Encounter for annual general medical examination with abnormal findings in adult Assessment & Plan: Immunizations UTD. Bone density scan due, orders placed.  Mammogram UTD Colon cancer screening UTD, due 2026  Discussed the importance of a healthy diet and regular exercise in order for weight loss, and to reduce the risk of further co-morbidity.  Exam stable. Labs pending.  Follow up in 1 year for repeat physical.    Hypothyroidism, unspecified type -     Levothyroxine Sodium; Take 1 tablet by mouth every morning on an empty stomach with water only.  No food or other medications for 30 minutes.  Dispense: 90 tablet; Refill: 3 -     TSH  Age-related osteoporosis without current pathological fracture Assessment & Plan: Following with oncology/hematology, office notes and labs reviewed from March 2024. Bone density scan ordered and pending.  Ductal carcinoma in situ (DCIS) of left breast Assessment & Plan: Following with hematology/oncology, office notes and labs reviewed from March 2024. Mammogram up-to-date.  Remain off  letrozole.   Normocytic anemia Assessment & Plan: Following with hematology, office notes and labs reviewed from March 2024. Continue watchful waiting.   Hyperlipidemia, unspecified hyperlipidemia type Assessment & Plan: Repeat lipid panel pending.  Discussed the importance of a healthy diet and regular exercise in order for weight loss, and to reduce the risk of further co-morbidity. Continue atorvastatin 10 mg daily.  Orders: -     Lipid panel  Prediabetes Assessment & Plan: Repeat A1C pending.  Discussed the importance of a healthy diet and regular exercise in order for weight loss, and to reduce the risk of further co-morbidity.   Orders: -     Hemoglobin A1c  Estrogen deficiency -     DG Bone Density; Future  Acute non-recurrent sinusitis, unspecified location Assessment & Plan: HPI suggestive of bacterial involvement at this point.  Treat with Augmentin 875-125 mg twice daily x 7 days. Continue nasal sprays, saline rinses, antihistamine daily.  Orders: -     Amoxicillin-Pot Clavulanate; Take 1 tablet by mouth 2 (two) times daily.  Dispense: 14 tablet; Refill: 0  Allergic rhinitis, unspecified seasonality, unspecified trigger Assessment & Plan: Controlled. Following with allergist.  Continue montelukast 10 mg at bedtime, Xyzal 5 mg as needed, Astelin nasal spray as needed.   Gastroesophageal reflux disease, unspecified whether esophagitis present Assessment & Plan: Controlled.  Continue omeprazole 10 mg as needed.         Doreene Nest, NP

## 2022-10-29 ENCOUNTER — Ambulatory Visit
Admission: RE | Admit: 2022-10-29 | Discharge: 2022-10-29 | Disposition: A | Discharge status: Home / Self Care | Payer: Medicare PPO | Source: Ambulatory Visit | Attending: Primary Care | Admitting: Primary Care

## 2022-10-29 DIAGNOSIS — E2839 Other primary ovarian failure: Secondary | ICD-10-CM | POA: Insufficient documentation

## 2022-10-29 DIAGNOSIS — M810 Age-related osteoporosis without current pathological fracture: Secondary | ICD-10-CM | POA: Diagnosis not present

## 2022-11-12 DIAGNOSIS — L918 Other hypertrophic disorders of the skin: Secondary | ICD-10-CM | POA: Diagnosis not present

## 2022-11-12 DIAGNOSIS — D2239 Melanocytic nevi of other parts of face: Secondary | ICD-10-CM | POA: Diagnosis not present

## 2022-11-12 DIAGNOSIS — L308 Other specified dermatitis: Secondary | ICD-10-CM | POA: Diagnosis not present

## 2022-11-12 DIAGNOSIS — L821 Other seborrheic keratosis: Secondary | ICD-10-CM | POA: Diagnosis not present

## 2022-11-24 DIAGNOSIS — M810 Age-related osteoporosis without current pathological fracture: Secondary | ICD-10-CM

## 2022-11-25 MED ORDER — ALENDRONATE SODIUM 70 MG PO TABS
70.0000 mg | ORAL_TABLET | ORAL | 3 refills | Status: DC
Start: 2022-11-25 — End: 2023-05-20

## 2022-12-17 ENCOUNTER — Other Ambulatory Visit: Payer: Self-pay | Admitting: Primary Care

## 2022-12-17 DIAGNOSIS — G8929 Other chronic pain: Secondary | ICD-10-CM

## 2022-12-17 DIAGNOSIS — E785 Hyperlipidemia, unspecified: Secondary | ICD-10-CM

## 2023-01-18 NOTE — Telephone Encounter (Signed)
Spoke with patient and informed her that her visit on Friday is covered by Medicare.

## 2023-01-22 ENCOUNTER — Ambulatory Visit (INDEPENDENT_AMBULATORY_CARE_PROVIDER_SITE_OTHER): Payer: Medicare PPO

## 2023-01-22 VITALS — BP 134/80 | Ht 59.0 in | Wt 147.4 lb

## 2023-01-22 DIAGNOSIS — H524 Presbyopia: Secondary | ICD-10-CM | POA: Diagnosis not present

## 2023-01-22 DIAGNOSIS — H2513 Age-related nuclear cataract, bilateral: Secondary | ICD-10-CM | POA: Diagnosis not present

## 2023-01-22 DIAGNOSIS — Z0000 Encounter for general adult medical examination without abnormal findings: Secondary | ICD-10-CM

## 2023-01-22 DIAGNOSIS — Z83518 Family history of other specified eye disorder: Secondary | ICD-10-CM | POA: Diagnosis not present

## 2023-01-22 DIAGNOSIS — H5203 Hypermetropia, bilateral: Secondary | ICD-10-CM | POA: Diagnosis not present

## 2023-01-22 NOTE — Progress Notes (Signed)
Subjective:   Mallory Leblanc is a 72 y.o. female who presents for Medicare Annual (Subsequent) preventive examination.  Visit Complete: In person  Patient Medicare AWV questionnaire was completed by the patient on 01/21/23; I have confirmed that all information answered by patient is correct and no changes since this date. Cardiac Risk Factors include: advanced age (>42men, >33 women);dyslipidemia    Objective:    Today's Vitals   01/22/23 1457  BP: 134/80  Weight: 147 lb 6.4 oz (66.9 kg)  Height: 4\' 11"  (1.499 m)   Body mass index is 29.77 kg/m.     01/22/2023    3:08 PM 05/26/2022    9:56 AM 09/26/2021    1:16 PM 05/28/2021   10:08 AM 11/27/2020   10:30 AM 11/27/2019   10:31 AM 05/22/2019   10:28 AM  Advanced Directives  Does Patient Have a Medical Advance Directive? No No No No No No No  Would patient like information on creating a medical advance directive?  Yes (MAU/Ambulatory/Procedural Areas - Information given) No - Patient declined  Yes (MAU/Ambulatory/Procedural Areas - Information given) No - Patient declined No - Patient declined    Current Medications (verified) Outpatient Encounter Medications as of 01/22/2023  Medication Sig   acetaminophen (TYLENOL) 500 MG tablet Take 1,000 mg by mouth every 8 (eight) hours as needed for mild pain or moderate pain.   alendronate (FOSAMAX) 70 MG tablet Take 1 tablet (70 mg total) by mouth every 7 (seven) days. Take with a full glass of water on an empty stomach.  Avoid laying flat for 2 hours.   atorvastatin (LIPITOR) 10 MG tablet TAKE ONE TABLET BY MOUTH ONCE A DAY FOR CHOLESTEROL   azelastine (ASTELIN) 0.1 % nasal spray Place 1 spray into both nostrils 2 (two) times daily. Use in each nostril as directed   azelastine (OPTIVAR) 0.05 % ophthalmic solution Place 1 drop into both eyes daily.   Cholecalciferol (D3 VITAMIN PO) Take by mouth.   Cyanocobalamin (B-12) 3000 MCG CAPS Take 1 capsule by mouth daily.   diclofenac Sodium  (VOLTAREN) 1 % GEL APPLY TWO GRAMS TOPICALLY THREE TIMES DAILY AS NEEDED   ferrous sulfate 325 (65 FE) MG tablet Take 325 mg by mouth daily with breakfast.   levocetirizine (XYZAL) 5 MG tablet Take by mouth.   levothyroxine (SYNTHROID) 88 MCG tablet Take 1 tablet by mouth every morning on an empty stomach with water only.  No food or other medications for 30 minutes.   montelukast (SINGULAIR) 10 MG tablet Take by mouth.   Multiple Minerals-Vitamins (CALCIUM & VIT D3 BONE HEALTH PO) Take 1 capsule by mouth daily.   amoxicillin-clavulanate (AUGMENTIN) 875-125 MG tablet Take 1 tablet by mouth 2 (two) times daily. (Patient not taking: Reported on 01/22/2023)   omeprazole (PRILOSEC) 10 MG capsule Take 10 mg by mouth daily. (Patient not taking: Reported on 01/22/2023)   No facility-administered encounter medications on file as of 01/22/2023.    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   Genital warts    GERD (gastroesophageal reflux disease)    OCC   Hyperlipidemia    Hypothyroidism    Personal history of radiation therapy 2018   F/U left breast cancer   Past Surgical History:  Procedure Laterality Date   APPENDECTOMY     BREAST  BIOPSY Left 09/21/2016   DCIS   BREAST LUMPECTOMY Left 2018   DCIS   cyst     on the left ovary early 80's   ETHMOIDECTOMY Bilateral 01/17/2019   Procedure: ETHMOIDECTOMY;  Surgeon: Geanie Logan, MD;  Location: [facility];  Service: ENT;  Laterality: Bilateral;   FRONTAL SINUS EXPLORATION Bilateral 01/17/2019   Procedure: FRONTAL SINUS EXPLORATION;  Surgeon: Geanie Logan, MD;  Location: [facility];  Service: ENT;  Laterality: Bilateral;   IMAGE GUIDED SINUS SURGERY N/A 01/17/2019   Procedure: IMAGE GUIDED SINUS SURGERY;  Surgeon: Geanie Logan, MD;  Location: [facility];  Service: ENT;   Laterality: N/A;  NEED STRYKER DISK put disk on or charge nurse desk 10-30  kp gave 2nd disk to Darl Pikes 11-4 kp   MAXILLARY ANTROSTOMY Bilateral 01/17/2019   Procedure: MAXILLARY ANTROSTOMY;  Surgeon: Geanie Logan, MD;  Location: [facility];  Service: ENT;  Laterality: Bilateral;   PARTIAL MASTECTOMY WITH NEEDLE LOCALIZATION Left 10/16/2016   Procedure: PARTIAL MASTECTOMY WITH NEEDLE LOCALIZATION;  Surgeon: Nadeen Landau, MD;  Location: [facility];  Service: General;  Laterality: Left;   SENTINEL NODE BIOPSY Left 10/16/2016   Procedure: SENTINEL NODE BIOPSY;  Surgeon: Nadeen Landau, MD;  Location: [facility];  Service: General;  Laterality: Left;   SPHENOIDECTOMY Left 01/17/2019   Procedure: SPHENOIDOTOMY;  Surgeon: Geanie Logan, MD;  Location: [facility];  Service: ENT;  Laterality: Left;   WRIST FRACTURE SURGERY Right    Family History  Problem Relation Age of Onset   Heart disease Father    Social History   Socioeconomic History   Marital status: Married    Spouse name: Not on file   Number of children: Not on file   Years of education: Not on file   Highest education level: 12th grade  Occupational History   Not on file  Tobacco Use   Smoking status: Never   Smokeless tobacco: Never  Vaping Use   Vaping status: Never Used  Substance and Sexual Activity   Alcohol use: Yes    Alcohol/week: 6.0 standard drinks of alcohol    Types: 6 Cans of beer per week    Comment: BEER OCC   Drug use: No   Sexual activity: Not on file  Other Topics Concern   Not on file  Social History Narrative   Married.   1 child.    Works as a Architectural technologist.   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   Social Determinants of Health   Financial Resource Strain: Low Risk  (01/21/2023)   Overall Financial Resource Strain (CARDIA)    Difficulty of Paying Living Expenses: Not hard at all  Food Insecurity: No Food Insecurity  (01/21/2023)   Hunger Vital Sign    Worried About Running Out of Food in the Last Year: Never true    Ran Out of Food in the Last Year: Never true  Transportation Needs: No Transportation Needs (01/21/2023)   PRAPARE - Administrator, Civil Service (Medical): No    Lack of Transportation (Non-Medical): No  Physical Activity: Sufficiently Active (01/21/2023)   Exercise Vital Sign    Days of Exercise per Week: 6 days    Minutes of Exercise per Session: 40 min  Stress: Stress Concern Present (01/21/2023)   [employer] of Occupational Health - Occupational Stress Questionnaire    Feeling of Stress : To some extent  Social Connections: Socially Integrated (  01/21/2023)   Social Connection and Isolation Panel [NHANES]    Frequency of Communication with Friends and Family: More than three times a week    Frequency of Social Gatherings with Friends and Family: More than three times a week    Attends Religious Services: More than 4 times per year    Active Member of [employer] or Organizations: Yes    Attends Engineer, structural: More than 4 times per year    Marital Status: Married    Tobacco Counseling Counseling given: Not Answered  Clinical Intake:  Pre-visit preparation completed: No  Pain : No/denies pain    BMI - recorded: 29.77 Nutritional Status: BMI 25 -29 Overweight Nutritional Risks: None Diabetes: No  How often do you need to have someone help you when you read instructions, pamphlets, or other written materials from your doctor or pharmacy?: 1 - Never  Interpreter Needed?: No  Comments: lives with husband Information entered by :: B.Zeshan Sena,LPN   Activities of Daily Living    01/21/2023    8:47 AM 11/24/2022    5:30 PM  In your present state of health, do you have any difficulty performing the following activities:  Hearing? 0 0  Vision? 0 0  Difficulty concentrating or making decisions? 0 0  Walking or climbing stairs? 0 0  Dressing  or bathing? 0 0  Doing errands, shopping? 0 0  Preparing Food and eating ? N N  Using the Toilet? N N  In the past six months, have you accidently leaked urine? N N  Do you have problems with loss of bowel control? N N  Managing your Medications? N N  Managing your Finances? N N  Housekeeping or managing your Housekeeping? N N    Patient Care Team: Doreene Nest, NP as PCP - General (Internal Medicine)  Indicate any recent Medical Services you may have received from other than [facility] providers in the past year (date may be approximate).     Assessment:   This is a routine wellness examination for Mallory Leblanc.  Hearing/Vision screen Hearing Screening - Comments:: Pt says her hearing is going good   Goals Addressed               This Visit's Progress     Stay healthy (pt-stated)        Lose weight.I would like to use 10 more pounds       Depression Screen    01/22/2023    3:05 PM 10/20/2022   11:01 AM 08/10/2022   10:20 AM 10/17/2021    9:38 AM 09/26/2021    1:10 PM 06/30/2021   11:01 AM 08/06/2020    7:45 AM  PHQ 2/9 Scores  PHQ - 2 Score 0 0 0 0 0 0 0  PHQ- 9 Score  1  0   0    Fall Risk    01/21/2023    8:47 AM 11/24/2022    5:30 PM 10/20/2022   11:01 AM 09/22/2022   10:21 AM 08/10/2022   10:20 AM  Fall Risk   Falls in the past year? 0 0 1 1 1   Number falls in past yr: 0 0 0 1 0  Injury with Fall? 0 0 0 0 0  Risk for fall due to : No Fall Risks  History of fall(s)    Follow up Education provided;Falls prevention discussed        MEDICARE RISK AT HOME: Medicare Risk at Home Any stairs in or  around the home?: Yes If so, are there any without handrails?: Yes Home free of loose throw rugs in walkways, pet beds, electrical cords, etc?: Yes Adequate lighting in your home to reduce risk of falls?: Yes Life alert?: No Use of a cane, walker or w/c?: No Grab bars in the bathroom?: No Shower chair or bench in shower?: No Elevated toilet seat or a handicapped  toilet?: No  TIMED UP AND GO:  Was the test performed?  Yes  Length of time to ambulate 10 feet: 8 sec Gait steady and fast without use of assistive device    Cognitive Function:        01/22/2023    3:09 PM 09/26/2021    1:16 PM  6CIT Screen  What Year? 0 points 0 points  What month? 0 points 0 points  What time? 0 points 0 points  Count back from 20 0 points 0 points  Months in reverse 0 points 0 points  Repeat phrase 0 points 0 points  Total Score 0 points 0 points    Immunizations Immunization History  Administered Date(s) Administered   Fluad Quad(high Dose 65+) 11/30/2018, 12/04/2019, 12/23/2022   Influenza, High Dose Seasonal PF 12/10/2020   Influenza,inj,Quad PF,6+ Mos 12/07/2017   Influenza,inj,quad, With Preservative 12/25/2016   Influenza-Unspecified 12/14/2017   Moderna Sars-Covid-2 Vaccination 06/08/2019, 07/05/2019, 12/10/2020   PNEUMOCOCCAL CONJUGATE-20 08/06/2020   Pneumococcal Polysaccharide-23 12/10/2020, 06/24/2021   Zoster Recombinant(Shingrix) 06/09/2017, 08/04/2017, 08/11/2017    TDAP status: Up to date  Flu Vaccine status: Up to date  Pneumococcal vaccine status: Up to date  Covid-19 vaccine status: Completed vaccines  Qualifies for Shingles Vaccine? Yes   Zostavax completed Yes   Shingrix Completed?: Yes  Screening Tests Health Maintenance  Topic Date Due   DTaP/Tdap/Td (1 - Tdap) 01/22/2024 (Originally 04/22/1969)   Colonoscopy  01/22/2024 (Originally 04/21/1997)   Medicare Annual Wellness (AWV)  01/22/2024   MAMMOGRAM  10/18/2024   Pneumonia Vaccine 52+ Years old  Completed   INFLUENZA VACCINE  Completed   DEXA SCAN  Completed   Hepatitis C Screening  Completed   Zoster Vaccines- Shingrix  Completed   HPV VACCINES  Aged Out   COVID-19 Vaccine  Discontinued    Health Maintenance  There are no preventive care reminders to display for this patient.   Colorectal cancer screening: Type of screening: Cologuard. Completed 2023.  Repeat every 3 years  Mammogram status: Completed 10/19/22. Repeat every year  Bone Density status: Completed 10/29/22. Results reflect: Bone density results: OSTEOPOROSIS. Repeat every 3 years.  Lung Cancer Screening: (Low Dose CT Chest recommended if Age 89-80 years, 20 pack-year currently smoking OR have quit w/in 15years.) does not qualify.   Lung Cancer Screening Referral: no  Additional Screening:  Hepatitis C Screening: does not qualify; Completed 09/20/2018  Vision Screening: Recommended annual ophthalmology exams for early detection of glaucoma and other disorders of the eye. Is the patient up to date with their annual eye exam?  Yes  Who is the provider or what is the name of the office in which the patient attends annual eye exams? Dr Dion Body If pt is not established with a provider, would they like to be referred to a provider to establish care? No .   Dental Screening: Recommended annual dental exams for proper oral hygiene  Diabetic Foot Exam: n/a  Community Resource Referral / Chronic Care Management: CRR required this visit?  No   CCM required this visit?  No    Plan:  I have personally reviewed and noted the following in the patient's chart:   Medical and social history Use of alcohol, tobacco or illicit drugs  Current medications and supplements including opioid prescriptions. Patient is not currently taking opioid prescriptions. Functional ability and status Nutritional status Physical activity Advanced directives List of other physicians Hospitalizations, surgeries, and ER visits in previous 12 months Vitals Screenings to include cognitive, depression, and falls Referrals and appointments  In addition, I have reviewed and discussed with patient certain preventive protocols, quality metrics, and best practice recommendations. A written personalized care plan for preventive services as well as general preventive health recommendations were provided  to patient.    Sue Lush, LPN   [id number]   After Visit Summary: (MyChart) Due to this being a telephonic visit, the after visit summary with patients personalized plan was offered to patient via MyChart   Nurse Notes: The patient states she is doing well and has no concerns or questions at this time.

## 2023-01-22 NOTE — Patient Instructions (Addendum)
Mallory Leblanc , Thank you for taking time to come for your Medicare Wellness Visit. I appreciate your ongoing commitment to your health goals. Please review the following plan we discussed and let me know if I can assist you in the future.   Referrals/Orders/Follow-Ups/Clinician Recommendations: none  This is a list of the screening recommended for you and due dates:  Health Maintenance  Topic Date Due   DTaP/Tdap/Td vaccine (1 - Tdap) 01/22/2024*   Colon Cancer Screening  01/22/2024*   Medicare Annual Wellness Visit  01/22/2024   Mammogram  10/18/2024   Pneumonia Vaccine  Completed   Flu Shot  Completed   DEXA scan (bone density measurement)  Completed   Hepatitis C Screening  Completed   Zoster (Shingles) Vaccine  Completed   HPV Vaccine  Aged Out   COVID-19 Vaccine  Discontinued  *Topic was postponed. The date shown is not the original due date.    Advanced directives: (ACP Link)Information on Advanced Care Planning can be found at [facility] Directives Advance Health Care Directives ([email or web address]  Pt says she has at home, completed needs notarization  Next Medicare Annual Wellness Visit scheduled for next year: Yes 01/25/2024 @ 3pm in person

## 2023-01-31 NOTE — Progress Notes (Unsigned)
    Mallory Mccamy T. [name], MD, CAQ Sports Medicine [facility] [address] [place], [address]  Phone: [phone]  FAX: [phone]  Mallory Leblanc - 72 y.o. female  MRN [id number]  Date of Birth: Mar 29, 1950  Date: 02/01/2023  PCP: Mallory Nest, NP  Referral: Mallory Nest, NP  No chief complaint on file.  Subjective:   Mallory Leblanc is a 72 y.o. very pleasant female patient with There is no height or weight on file to calculate BMI. who presents with the following:  She is a very pleasant patient, and she presents with some ongoing shoulder pain after starting working out at [employer].  I have seen her a number of times over the years for knee pain as well as ankle pain in the last year.    Review of Systems is noted in the HPI, as appropriate  Objective:   There were no vitals taken for this visit.  GEN: No acute distress; alert,appropriate. PULM: Breathing comfortably in no respiratory distress PSYCH: Normally interactive.   Laboratory and Imaging Data:  Assessment and Plan:   ***

## 2023-02-01 ENCOUNTER — Encounter: Payer: Self-pay | Admitting: Family Medicine

## 2023-02-01 ENCOUNTER — Ambulatory Visit: Payer: Medicare PPO | Admitting: Family Medicine

## 2023-02-01 VITALS — BP 112/80 | HR 68 | Temp 98.9°F | Ht 59.0 in | Wt 144.5 lb

## 2023-02-01 DIAGNOSIS — M7582 Other shoulder lesions, left shoulder: Secondary | ICD-10-CM

## 2023-02-01 DIAGNOSIS — M7581 Other shoulder lesions, right shoulder: Secondary | ICD-10-CM | POA: Diagnosis not present

## 2023-02-01 MED ORDER — PREDNISONE 20 MG PO TABS: ORAL_TABLET | ORAL | 0 refills | Status: DC

## 2023-02-24 ENCOUNTER — Telehealth: Payer: Self-pay

## 2023-02-24 NOTE — Telephone Encounter (Signed)
Per appt notes p has AWV 02/24/23 at 3 pm with Albertine Grates. Sending note to Steward Drone and Dr Ermalene Searing who is in office today.

## 2023-02-24 NOTE — Telephone Encounter (Signed)
Apologize to Steward Drone that appt is not until next year. And pt already has appt scheduled to see Dr Ermalene Searing on 02/25/23 at 10:20 for pain in shoulders. Sending note to [facility] and Dr Ermalene Searing.

## 2023-02-24 NOTE — Telephone Encounter (Signed)
LVM to call and schedule

## 2023-02-24 NOTE — Telephone Encounter (Signed)
Please, call patient to reschedule if needed.. for acute visit or AMV

## 2023-02-25 ENCOUNTER — Ambulatory Visit: Payer: Medicare PPO | Admitting: Family Medicine

## 2023-02-25 VITALS — BP 120/68 | HR 75 | Temp 99.6°F | Ht 59.0 in | Wt 145.1 lb

## 2023-02-25 DIAGNOSIS — M25512 Pain in left shoulder: Secondary | ICD-10-CM | POA: Insufficient documentation

## 2023-02-25 DIAGNOSIS — M25511 Pain in right shoulder: Secondary | ICD-10-CM | POA: Diagnosis not present

## 2023-02-25 MED ORDER — MELOXICAM 15 MG PO TABS: 15.0000 mg | ORAL_TABLET | Freq: Every day | ORAL | 0 refills | Status: DC

## 2023-02-25 NOTE — Assessment & Plan Note (Signed)
Acute, likely rotator cuff tendinitis as diagnosed by Dr. Patsy Lager.  No red flags on exam today.  No sign of neck pathology. She did have significant benefit with prednisone course especially with right shoulder. Will have her try trial of meloxicam 15 mg p.o. daily for the next 1 to 2 weeks in addition to starting home physical therapy, range of motion exercises for rotator cuff tendinitis. No indication for x-ray at this time.  She will return if her symptoms are not continuing to improve as expected, to see Dr. Patsy Lager sports medicine.  If she may need steroid injection versus x-ray etc. at that time.

## 2023-02-25 NOTE — Progress Notes (Addendum)
Patient ID: Mallory Leblanc, female    DOB: 10-15-1950, 72 y.o.   MRN: [id number]  This visit was conducted in person.  BP 120/68 (BP Location: Right Arm, Patient Position: Sitting, Cuff Size: Normal)   Pulse 75   Temp 99.6 F (37.6 C) (Temporal)   Ht 4\' 11"  (1.499 m)   Wt 145 lb 2 oz (65.8 kg)   SpO2 98%   BMI 29.31 kg/m    CC:  Chief Complaint  Patient presents with   Shoulder Pain    Seen Dr. Patsy Lager 02/01/2023-Bilateral but Left is worse    Subjective:   HPI: Mallory Leblanc is a 72 y.o. female patient of Mayra Reel presenting on 02/25/2023 for Shoulder Pain (Seen Dr. Patsy Lager 02/01/2023-Bilateral but Left is worse)   Reviewed recent office visit with Dr. Patsy Lager January 30, 2023  Seen for ongoing shoulder pain bilaterally after started working out at [employer]. No known specific injury. Felt most likely tendinitis of both rotator cuffs likely brought on by overuse, lifting upper body 6 days a week. Recommend splitting up routine and limiting upper body to no more than 3 days a week. Treated with prednisone 40 mg x 5 days then 20 mg x 5 days.  Today she reports she has had some improvement in shoulder pain but still sore. Left  (50% improvement) worse than right ( 90 percent better).  She has decreased upper body exercises.  No neck pain, no numbness, no weakness.  No fever.    Pain with abduction over head.  Treating with ice and heat.. using tylenol 650 mg 2 tabs 3 times a day.   Hx of breast cancer on left  Relevant past medical, surgical, family and social history reviewed and updated as indicated. Interim medical history since our last visit reviewed. Allergies and medications reviewed and updated. Outpatient Medications Prior to Visit  Medication Sig Dispense Refill   acetaminophen (TYLENOL) 500 MG tablet Take 1,000 mg by mouth every 8 (eight) hours as needed for mild pain or moderate pain.     alendronate (FOSAMAX) 70 MG tablet Take 1 tablet (70 mg total) by  mouth every 7 (seven) days. Take with a full glass of water on an empty stomach.  Avoid laying flat for 2 hours. 12 tablet 3   atorvastatin (LIPITOR) 10 MG tablet TAKE ONE TABLET BY MOUTH ONCE A DAY FOR CHOLESTEROL 90 tablet 2   azelastine (ASTELIN) 0.1 % nasal spray Place 1 spray into both nostrils 2 (two) times daily. Use in each nostril as directed 30 mL 0   azelastine (OPTIVAR) 0.05 % ophthalmic solution Place 1 drop into both eyes daily.     Cholecalciferol (D3 VITAMIN PO) Take by mouth.     Cyanocobalamin (B-12) 3000 MCG CAPS Take 1 capsule by mouth daily.     diclofenac Sodium (VOLTAREN) 1 % GEL APPLY TWO GRAMS TOPICALLY THREE TIMES DAILY AS NEEDED 300 g 0   ferrous sulfate 325 (65 FE) MG tablet Take 325 mg by mouth daily with breakfast.     levocetirizine (XYZAL) 5 MG tablet Take 5 mg by mouth daily as needed.     levothyroxine (SYNTHROID) 88 MCG tablet Take 1 tablet by mouth every morning on an empty stomach with water only.  No food or other medications for 30 minutes. 90 tablet 3   montelukast (SINGULAIR) 10 MG tablet Take by mouth.     Multiple Minerals-Vitamins (CALCIUM & VIT D3 BONE HEALTH PO) Take 1  capsule by mouth daily.     predniSONE (DELTASONE) 20 MG tablet 2 tabs po daily for 5 days, then 1 tab po daily for 5 days 15 tablet 0   No facility-administered medications prior to visit.     Per HPI unless specifically indicated in ROS section below Review of Systems Objective:  BP 120/68 (BP Location: Right Arm, Patient Position: Sitting, Cuff Size: Normal)   Pulse 75   Temp 99.6 F (37.6 C) (Temporal)   Ht 4\' 11"  (1.499 m)   Wt 145 lb 2 oz (65.8 kg)   SpO2 98%   BMI 29.31 kg/m   Wt Readings from Last 3 Encounters:  02/25/23 145 lb 2 oz (65.8 kg)  02/01/23 144 lb 8 oz (65.5 kg)  01/22/23 147 lb 6.4 oz (66.9 kg)      Physical Exam Musculoskeletal:     Right shoulder: Normal.     Left shoulder: Tenderness present. No swelling, deformity, effusion, bony tenderness or  crepitus. Decreased range of motion. Normal strength. Normal pulse.     Comments: Mildly positive empty can test, negative drop arm test  Neurological:     Cranial Nerves: Cranial nerves 2-12 are intact.     Sensory: Sensation is intact.     Motor: Motor function is intact.     Coordination: Coordination is intact.     Comments: Negative Spurling's bilaterally       Results for orders placed or performed in visit on 10/20/22  Lipid panel   Collection Time: 10/20/22 11:38 AM  Result Value Ref Range   Cholesterol 158 0 - 200 mg/dL   Triglycerides 161.0 (H) 0.0 - 149.0 mg/dL   HDL 96.04 >54.09 mg/dL   VLDL 81.1 (H) 0.0 - 91.4 mg/dL   Total CHOL/HDL Ratio 3    NonHDL 105.92   Hemoglobin A1c   Collection Time: 10/20/22 11:38 AM  Result Value Ref Range   Hgb A1c MFr Bld 6.2 4.6 - 6.5 %  TSH   Collection Time: 10/20/22 11:38 AM  Result Value Ref Range   TSH 1.57 0.35 - 5.50 uIU/mL  LDL cholesterol, direct   Collection Time: 10/20/22 11:38 AM  Result Value Ref Range   Direct LDL 83.0 mg/dL    Assessment and Plan  Acute pain of both shoulders Assessment & Plan:  Acute, likely rotator cuff tendinitis as diagnosed by Dr. Patsy Lager.  No red flags on exam today.  No sign of neck pathology. She did have significant benefit with prednisone course especially with right shoulder. Will have her try trial of meloxicam 15 mg p.o. daily for the next 1 to 2 weeks in addition to starting home physical therapy, range of motion exercises for rotator cuff tendinitis. No indication for x-ray at this time.  She will return if her symptoms are not continuing to improve as expected, to see Dr. Patsy Lager sports medicine.  If she may need steroid injection versus x-ray etc. at that time.   Other orders -     Meloxicam; Take 1 tablet (15 mg total) by mouth daily.  Dispense: 30 tablet; Refill: 0    No follow-ups on file.   Kerby Nora, MD

## 2023-03-04 DIAGNOSIS — U071 COVID-19: Secondary | ICD-10-CM | POA: Diagnosis not present

## 2023-03-04 DIAGNOSIS — R059 Cough, unspecified: Secondary | ICD-10-CM | POA: Diagnosis not present

## 2023-03-04 DIAGNOSIS — J029 Acute pharyngitis, unspecified: Secondary | ICD-10-CM | POA: Diagnosis not present

## 2023-03-18 ENCOUNTER — Encounter: Payer: Self-pay | Admitting: Family Medicine

## 2023-03-18 ENCOUNTER — Encounter: Payer: Self-pay | Admitting: Oncology

## 2023-03-19 ENCOUNTER — Ambulatory Visit (INDEPENDENT_AMBULATORY_CARE_PROVIDER_SITE_OTHER)
Admission: RE | Admit: 2023-03-19 | Discharge: 2023-03-19 | Disposition: A | Discharge status: Home / Self Care | Payer: HMO | Source: Ambulatory Visit | Attending: Family Medicine | Admitting: Family Medicine

## 2023-03-19 ENCOUNTER — Encounter: Payer: Self-pay | Admitting: Oncology

## 2023-03-19 ENCOUNTER — Ambulatory Visit (INDEPENDENT_AMBULATORY_CARE_PROVIDER_SITE_OTHER): Payer: HMO | Admitting: Family Medicine

## 2023-03-19 VITALS — BP 130/82 | HR 69 | Temp 98.1°F | Ht 59.0 in | Wt 145.4 lb

## 2023-03-19 DIAGNOSIS — M25512 Pain in left shoulder: Secondary | ICD-10-CM | POA: Diagnosis not present

## 2023-03-19 NOTE — Assessment & Plan Note (Signed)
 Acute... eval with X-ray.  Possible OA. No clear neck pathology. Comntinue meloxicam.  May need injection in bursa as some tenderness  shoulder bursa today.

## 2023-03-19 NOTE — Patient Instructions (Addendum)
 We will  call with x-ray results.   If not improving consider follow up with Dr. Patsy Lager for possible injection.

## 2023-03-19 NOTE — Progress Notes (Signed)
 Patient ID: Mallory Leblanc, female    DOB: 1950-07-11, 73 y.o.   MRN: [id number]  This visit was conducted in person.  BP 130/82 (BP Location: Right Arm, Patient Position: Sitting, Cuff Size: Normal)   Pulse 69   Temp 98.1 F (36.7 C) (Temporal)   Ht 4' 11 (1.499 m)   Wt 145 lb 6 oz (65.9 kg)   SpO2 97%   BMI 29.36 kg/m    CC:  Chief Complaint  Patient presents with   Shoulder Pain    Bilateral but left is worse     Subjective:   HPI: Mallory Leblanc is a 73 y.o. female  patient of [name] presenting on 03/19/2023 for Shoulder Pain (Bilateral but left is worse/)   Bilateral shoulder pain, left worse than right.   Seen on 11/25 and  12/19  and similar issue  Started after started working out at [employer]. No known specific injury. Felt most likely tendinitis of both rotator cuffs likely brought on by overuse, lifting upper body 6 days a week. Recommend splitting up routine and limiting upper body to no more than 3 days a week. Treated with prednisone  40 mg x 5 days then 20 mg x 5 days.  Helped some.   Then tried trial of meloxicam ...  slight improvement.    Some pain in right neck.  No numbness, no tingling.  Hx of osteoporosis      On atorvastatin .. not new.  Fosamax  new in 11/2022  Relevant past medical, surgical, family and social history reviewed and updated as indicated. Interim medical history since our last visit reviewed. Allergies and medications reviewed and updated. Outpatient Medications Prior to Visit  Medication Sig Dispense Refill   acetaminophen  (TYLENOL ) 500 MG tablet Take 1,000 mg by mouth every 8 (eight) hours as needed for mild pain or moderate pain.     alendronate  (FOSAMAX ) 70 MG tablet Take 1 tablet (70 mg total) by mouth every 7 (seven) days. Take with a full glass of water on an empty stomach.  Avoid laying flat for 2 hours. 12 tablet 3   atorvastatin  (LIPITOR) 10 MG tablet TAKE ONE TABLET BY MOUTH ONCE A DAY FOR CHOLESTEROL 90 tablet 2    azelastine  (ASTELIN ) 0.1 % nasal spray Place 1 spray into both nostrils 2 (two) times daily. Use in each nostril as directed 30 mL 0   azelastine  (OPTIVAR ) 0.05 % ophthalmic solution Place 1 drop into both eyes daily.     Cholecalciferol (D3 VITAMIN PO) Take by mouth.     Cyanocobalamin  (B-12) 3000 MCG CAPS Take 1 capsule by mouth daily.     diclofenac  Sodium (VOLTAREN ) 1 % GEL APPLY TWO GRAMS TOPICALLY THREE TIMES DAILY AS NEEDED 300 g 0   ferrous sulfate 325 (65 FE) MG tablet Take 325 mg by mouth daily with breakfast.     levocetirizine (XYZAL ) 5 MG tablet Take 5 mg by mouth daily as needed.     levothyroxine  (SYNTHROID ) 88 MCG tablet Take 1 tablet by mouth every morning on an empty stomach with water only.  No food or other medications for 30 minutes. 90 tablet 3   meloxicam  (MOBIC ) 15 MG tablet Take 1 tablet (15 mg total) by mouth daily. 30 tablet 0   montelukast  (SINGULAIR ) 10 MG tablet Take by mouth.     Multiple Minerals-Vitamins (CALCIUM  & VIT D3 BONE HEALTH PO) Take 1 capsule by mouth daily.     No facility-administered medications prior to  visit.     Per HPI unless specifically indicated in ROS section below Review of Systems  Constitutional:  Negative for fatigue and fever.  HENT:  Negative for congestion.   Eyes:  Negative for pain.  Respiratory:  Negative for cough and shortness of breath.   Cardiovascular:  Negative for chest pain, palpitations and leg swelling.  Gastrointestinal:  Negative for abdominal pain.  Genitourinary:  Negative for dysuria and vaginal bleeding.  Musculoskeletal:  Negative for back pain.  Neurological:  Negative for syncope, light-headedness and headaches.  Psychiatric/Behavioral:  Negative for dysphoric mood.    Objective:  BP 130/82 (BP Location: Right Arm, Patient Position: Sitting, Cuff Size: Normal)   Pulse 69   Temp 98.1 F (36.7 C) (Temporal)   Ht 4' 11 (1.499 m)   Wt 145 lb 6 oz (65.9 kg)   SpO2 97%   BMI 29.36 kg/m   Wt Readings  from Last 3 Encounters:  03/19/23 145 lb 6 oz (65.9 kg)  02/25/23 145 lb 2 oz (65.8 kg)  02/01/23 144 lb 8 oz (65.5 kg)      Physical Exam Constitutional:      General: She is not in acute distress.    Appearance: Normal appearance. She is well-developed. She is not ill-appearing or toxic-appearing.  HENT:     Head: Normocephalic.     Right Ear: Hearing, tympanic membrane, ear canal and external ear normal. Tympanic membrane is not erythematous, retracted or bulging.     Left Ear: Hearing, tympanic membrane, ear canal and external ear normal. Tympanic membrane is not erythematous, retracted or bulging.     Nose: No mucosal edema or rhinorrhea.     Right Sinus: No maxillary sinus tenderness or frontal sinus tenderness.     Left Sinus: No maxillary sinus tenderness or frontal sinus tenderness.     Mouth/Throat:     Pharynx: Uvula midline.  Eyes:     General: Lids are normal. Lids are everted, no foreign bodies appreciated.     Conjunctiva/sclera: Conjunctivae normal.     Pupils: Pupils are equal, round, and reactive to light.  Neck:     Thyroid : No thyroid  mass or thyromegaly.     Vascular: No carotid bruit.     Trachea: Trachea normal.  Cardiovascular:     Rate and Rhythm: Normal rate and regular rhythm.     Pulses: Normal pulses.     Heart sounds: Normal heart sounds, S1 normal and S2 normal. No murmur heard.    No friction rub. No gallop.  Pulmonary:     Effort: Pulmonary effort is normal. No tachypnea or respiratory distress.     Breath sounds: Normal breath sounds. No decreased breath sounds, wheezing, rhonchi or rales.  Abdominal:     General: Bowel sounds are normal.     Palpations: Abdomen is soft.     Tenderness: There is no abdominal tenderness.  Musculoskeletal:     Left shoulder: Tenderness present. Decreased range of motion.     Cervical back: Normal range of motion and neck supple. No tenderness or bony tenderness. No pain with movement. Normal range of motion.   Skin:    General: Skin is warm and dry.     Findings: No rash.  Neurological:     Mental Status: She is alert and oriented to person, place, and time.     Comments: Neg spurling  Psychiatric:        Mood and Affect: Mood is not anxious or depressed.  Speech: Speech normal.        Behavior: Behavior normal. Behavior is cooperative.        Thought Content: Thought content normal.        Judgment: Judgment normal.       Results for orders placed or performed in visit on 10/20/22  Lipid panel   Collection Time: 10/20/22 11:38 AM  Result Value Ref Range   Cholesterol 158 0 - 200 mg/dL   Triglycerides 796.9 (H) 0.0 - 149.0 mg/dL   HDL 48.19 >60.99 mg/dL   VLDL 59.3 (H) 0.0 - 59.9 mg/dL   Total CHOL/HDL Ratio 3    NonHDL 105.92   Hemoglobin A1c   Collection Time: 10/20/22 11:38 AM  Result Value Ref Range   Hgb A1c MFr Bld 6.2 4.6 - 6.5 %  TSH   Collection Time: 10/20/22 11:38 AM  Result Value Ref Range   TSH 1.57 0.35 - 5.50 uIU/mL  LDL cholesterol, direct   Collection Time: 10/20/22 11:38 AM  Result Value Ref Range   Direct LDL 83.0 mg/dL    Assessment and Plan  Acute pain of left shoulder Assessment & Plan:  Acute... eval with X-ray.  Possible OA. No clear neck pathology. Comntinue meloxicam .  May need injection in bursa as some tenderness  shoulder bursa today.  Orders: -     DG Shoulder Left; Future    No follow-ups on file.   Greig Ring, MD

## 2023-03-21 NOTE — Progress Notes (Signed)
 Mallory Barich T. Mallory Gowin, MD, CAQ Sports Medicine [facility] [address] [place], [address]  Phone: [phone]  FAX: [phone]  Mallory Leblanc - 73 y.o. female  MRN [id number]  Date of Birth: 09-27-1950  Date: 03/22/2023  PCP: Mallory Comer POUR, NP  Referral: Mallory Comer POUR, NP  Chief Complaint  Patient presents with   Shoulder Pain    Shoulder Blade Fx   Subjective:   Mallory Leblanc is a 73 y.o. very pleasant female patient with Body mass index is 29.72 kg/m. who presents with the following:  Patient is here in follow-up.  I saw her roughly 6 weeks ago, and at that point I thought she was having some rotator cuff tendinopathy.  She subsequently saw Dr. Avelina 2 additional times and on recent shoulder x-ray series there is a age-indeterminate coracoid fracture visualized on the plain film.  Had Covid 03/04/2023.   Has a puppy put down a year ago, and has a lab / hound, did have a fall before Thanksgiving.  Right now she is having some relatively mild bilateral shoulder pain, left greater than right.  She has not been lifting quite as much, she has been doing some pulling without much significant issue, did not think she has some pain with doing pressing movements overhead.  Has 3 pound weights at home that she uses for strength.  Review of Systems is noted in the HPI, as appropriate  Objective:   BP (!) 140/88 (BP Location: Right Arm, Patient Position: Sitting, Cuff Size: Normal)   Pulse 75   Temp 99.4 F (37.4 C) (Temporal)   Ht 4' 11 (1.499 m)   Wt 147 lb 2 oz (66.7 kg)   SpO2 98%   BMI 29.72 kg/m   GEN: No acute distress; alert,appropriate. PULM: Breathing comfortably in no respiratory distress PSYCH: Normally interactive.   Shoulder: B Inspection: No muscle wasting or winging Ecchymosis/edema: neg  AC joint, scapula, clavicle: She has minimal to mild pain with direct coracoid compression Cervical spine: NT, full  ROM Spurling's: neg Abduction: full, 5/5, tender arc of motion Flexion: full, 5/5 IR, full, lift-off: 5/5 ER at neutral: full, 5/5 AC crossover: Mildly positive Neer: Mildly positive Hawkins: Mildly positive Drop Test: neg Jobe: pos Supraspinatus insertion: mild-mod T Bicipital groove: NT Speed's: neg Yergason's: neg Sulcus sign: neg Scapular dyskinesis: none C5-T1 intact  Neuro: Sensation intact Grip 5/5   Laboratory and Imaging Data: DG Shoulder Left Result Date: 03/19/2023 CLINICAL DATA:  Left shoulder pain. EXAM: LEFT SHOULDER - 2+ VIEW COMPARISON:  None Available. FINDINGS: Seen only on the provided scapular Y radiograph is an age-indeterminate minimally displaced fracture involving the base of the coracoid process of the scapula. No additional fractures are identified. Glenohumeral joint spaces appear preserved. Moderate degenerative change of the left AC joint with joint space loss, subchondral sclerosis and osteophytosis. No evidence of calcific tendinitis. Limited visualization of the adjacent thorax demonstrates minimal left basilar linear heterogeneous opacities favored to represent atelectasis. Vascular calcifications overlie the expected location of the left carotid bulb. IMPRESSION: 1. Age-indeterminate minimally displaced fracture involving the base of the coracoid process of the scapula. Correlation for point tenderness at this location is advised. Further evaluation with shoulder MRI or CT could be performed indicated 2. Moderate degenerative change of the left AC joint. 3. Aortic Atherosclerosis (ICD10-I70.0). Electronically Signed   By: Norleen Roulette M.D.   On: 03/19/2023 12:43     Assessment and Plan:  ICD-10-CM   1. Acute pain of both shoulders  M25.511 Ambulatory referral to Physical Therapy   M25.512     2. Tendonitis of both rotator cuffs  M75.81 Ambulatory referral to Physical Therapy   M75.82     3. Nondisplaced fracture of coracoid process, left  shoulder, initial encounter for closed fracture  S42.135A Ambulatory referral to Physical Therapy     I think that the visualized coracoid process fracture is age-indeterminate, and at the very least I do not think that that is an actively problematic fracture.  Coracoid fracture after fall is possible in the fall so subacute fracture may be possible, but difficult to know for sure.  I aggressively palpated and manipulated the coracoid on exam, and it was only minimally tender to manipulation.  I gave her some rotator cuff and scapular stabilization work to do with her light weights at home.  I am also going to have her do some formal physical therapy, which I think would be key into the recovery of her shoulders.  I will check her back in 2 months.  Medication Management during today's office visit: No orders of the defined types were placed in this encounter.  There are no discontinued medications.  Orders placed today for conditions managed today: Orders Placed This Encounter  Procedures   Ambulatory referral to Physical Therapy    Disposition: Return in about 2 months (around 05/20/2023) for Dr. Watt, shoulder pain.  Dragon [employer] One speech-to-text software was used for transcription in this dictation.  Possible transcriptional errors can occur using Animal nutritionist.   Signed,  Jacques DASEN. Kylani Wires, MD   Outpatient Encounter Medications as of 03/22/2023  Medication Sig   acetaminophen  (TYLENOL ) 500 MG tablet Take 1,000 mg by mouth every 8 (eight) hours as needed for mild pain or moderate pain.   alendronate  (FOSAMAX ) 70 MG tablet Take 1 tablet (70 mg total) by mouth every 7 (seven) days. Take with a full glass of water on an empty stomach.  Avoid laying flat for 2 hours.   atorvastatin  (LIPITOR) 10 MG tablet TAKE ONE TABLET BY MOUTH ONCE A DAY FOR CHOLESTEROL   azelastine  (ASTELIN ) 0.1 % nasal spray Place 1 spray into both nostrils 2 (two) times daily. Use in each nostril as  directed   azelastine  (OPTIVAR ) 0.05 % ophthalmic solution Place 1 drop into both eyes daily.   Cholecalciferol (D3 VITAMIN PO) Take by mouth.   Cyanocobalamin  (B-12) 3000 MCG CAPS Take 1 capsule by mouth daily.   diclofenac  Sodium (VOLTAREN ) 1 % GEL APPLY TWO GRAMS TOPICALLY THREE TIMES DAILY AS NEEDED   ferrous sulfate 325 (65 FE) MG tablet Take 325 mg by mouth daily with breakfast.   levocetirizine (XYZAL ) 5 MG tablet Take 5 mg by mouth daily as needed.   levothyroxine  (SYNTHROID ) 88 MCG tablet Take 1 tablet by mouth every morning on an empty stomach with water only.  No food or other medications for 30 minutes.   meloxicam  (MOBIC ) 15 MG tablet Take 1 tablet (15 mg total) by mouth daily.   montelukast  (SINGULAIR ) 10 MG tablet Take by mouth.   Multiple Minerals-Vitamins (CALCIUM  & VIT D3 BONE HEALTH PO) Take 1 capsule by mouth daily.   No facility-administered encounter medications on file as of 03/22/2023.

## 2023-03-22 ENCOUNTER — Ambulatory Visit (INDEPENDENT_AMBULATORY_CARE_PROVIDER_SITE_OTHER): Payer: HMO | Admitting: Family Medicine

## 2023-03-22 ENCOUNTER — Ambulatory Visit: Payer: HMO | Admitting: Family Medicine

## 2023-03-22 ENCOUNTER — Encounter: Payer: Self-pay | Admitting: Family Medicine

## 2023-03-22 VITALS — BP 140/88 | HR 75 | Temp 99.4°F | Ht 59.0 in | Wt 147.1 lb

## 2023-03-22 DIAGNOSIS — M7582 Other shoulder lesions, left shoulder: Secondary | ICD-10-CM | POA: Diagnosis not present

## 2023-03-22 DIAGNOSIS — M7581 Other shoulder lesions, right shoulder: Secondary | ICD-10-CM

## 2023-03-22 DIAGNOSIS — S42135A Nondisplaced fracture of coracoid process, left shoulder, initial encounter for closed fracture: Secondary | ICD-10-CM | POA: Diagnosis not present

## 2023-03-22 DIAGNOSIS — M25511 Pain in right shoulder: Secondary | ICD-10-CM | POA: Diagnosis not present

## 2023-03-22 DIAGNOSIS — M25512 Pain in left shoulder: Secondary | ICD-10-CM

## 2023-03-24 ENCOUNTER — Other Ambulatory Visit: Payer: Self-pay | Admitting: Family Medicine

## 2023-03-24 NOTE — Telephone Encounter (Signed)
 Last office visit 03/22/2023 for acute pain of both shoulders with Dr. Geralyn Knee.  Last refilled 02/25/23 for #30 with no refills by Dr. Cherlyn Cornet.  Next Appt: 05/24/23 with Dr. Geralyn Knee.

## 2023-03-30 ENCOUNTER — Encounter: Payer: Self-pay | Admitting: Family Medicine

## 2023-03-30 NOTE — Progress Notes (Unsigned)
   Mallory Leblanc T. Mallory Kathan, MD, CAQ Sports Medicine [facility] [address] [place], [address]  Phone: [phone]  FAX: [phone]  Mallory Leblanc - 73 y.o. female  MRN [id number]  Date of Birth: [birth year]  Date: 03/31/2023  PCP: Doreene Nest, NP  Referral: Doreene Nest, NP  No chief complaint on file.  Subjective:   Mallory Leblanc is a 73 y.o. very pleasant female patient with There is no height or weight on file to calculate BMI. who presents with the following:  Patient presents for follow-up of ongoing shoulder pain.  When I saw her last, I thought her symptoms were fairly mild, so I recommended home rehab and formal physical therapy.  I did see her last week.  There was a question of possible coracoid fracture.  She did have a fall before Thanksgiving in November.  At this time and her last office visit vigorously examined her shoulder and coracoid, and at least right now there is not some severe pain.  Probable subacute coracoid fracture.    Review of Systems is noted in the HPI, as appropriate  Objective:   There were no vitals taken for this visit.  GEN: No acute distress; alert,appropriate. PULM: Breathing comfortably in no respiratory distress PSYCH: Normally interactive.   Laboratory and Imaging Data:  Assessment and Plan:   ***

## 2023-03-31 ENCOUNTER — Encounter: Payer: Self-pay | Admitting: Family Medicine

## 2023-03-31 ENCOUNTER — Ambulatory Visit (INDEPENDENT_AMBULATORY_CARE_PROVIDER_SITE_OTHER): Payer: HMO | Admitting: Family Medicine

## 2023-03-31 VITALS — BP 130/76 | HR 81 | Temp 98.6°F | Ht 59.0 in | Wt 147.5 lb

## 2023-03-31 DIAGNOSIS — M25512 Pain in left shoulder: Secondary | ICD-10-CM | POA: Diagnosis not present

## 2023-03-31 DIAGNOSIS — M7581 Other shoulder lesions, right shoulder: Secondary | ICD-10-CM

## 2023-03-31 DIAGNOSIS — M25511 Pain in right shoulder: Secondary | ICD-10-CM | POA: Diagnosis not present

## 2023-03-31 DIAGNOSIS — M7582 Other shoulder lesions, left shoulder: Secondary | ICD-10-CM | POA: Diagnosis not present

## 2023-03-31 DIAGNOSIS — S42135A Nondisplaced fracture of coracoid process, left shoulder, initial encounter for closed fracture: Secondary | ICD-10-CM

## 2023-03-31 MED ORDER — TRIAMCINOLONE ACETONIDE 40 MG/ML IJ SUSP
40.0000 mg | Freq: Once | INTRAMUSCULAR | Status: AC
Start: 2023-03-31 — End: 2023-03-31
  Administered 2023-03-31: 40 mg via INTRA_ARTICULAR

## 2023-03-31 MED ORDER — DICLOFENAC SODIUM 75 MG PO TBEC: 75.0000 mg | DELAYED_RELEASE_TABLET | Freq: Two times a day (BID) | ORAL | 3 refills | Status: AC

## 2023-04-09 ENCOUNTER — Encounter: Payer: Self-pay | Admitting: *Deleted

## 2023-04-15 ENCOUNTER — Other Ambulatory Visit: Payer: Self-pay

## 2023-04-15 DIAGNOSIS — G8929 Other chronic pain: Secondary | ICD-10-CM

## 2023-04-15 MED ORDER — DICLOFENAC SODIUM 1 % EX GEL
2.0000 g | Freq: Three times a day (TID) | CUTANEOUS | 0 refills | Status: DC | PRN
Start: 2023-04-15 — End: 2023-04-20

## 2023-04-19 ENCOUNTER — Telehealth: Payer: Self-pay

## 2023-04-19 ENCOUNTER — Other Ambulatory Visit (HOSPITAL_COMMUNITY): Payer: Self-pay

## 2023-04-19 ENCOUNTER — Encounter: Payer: Self-pay | Admitting: Oncology

## 2023-04-19 DIAGNOSIS — G8929 Other chronic pain: Secondary | ICD-10-CM

## 2023-04-19 NOTE — Telephone Encounter (Signed)
 Pharmacy Patient Advocate Encounter   Received notification from CoverMyMeds that prior authorization for Diclofenac  Sodium 1% gel is required/requested.   Insurance verification completed.   The patient is insured through [facility] .   Per test claim: Not covered under Part D law

## 2023-04-20 MED ORDER — DICLOFENAC SODIUM 1 % EX GEL
2.0000 g | Freq: Three times a day (TID) | CUTANEOUS | 0 refills | Status: AC | PRN
Start: 2023-04-20 — End: ?

## 2023-04-22 ENCOUNTER — Other Ambulatory Visit (HOSPITAL_COMMUNITY): Payer: Self-pay

## 2023-04-26 ENCOUNTER — Other Ambulatory Visit (HOSPITAL_COMMUNITY): Payer: Self-pay

## 2023-05-03 ENCOUNTER — Encounter: Payer: Self-pay | Admitting: Oncology

## 2023-05-08 DIAGNOSIS — H66002 Acute suppurative otitis media without spontaneous rupture of ear drum, left ear: Secondary | ICD-10-CM | POA: Diagnosis not present

## 2023-05-20 ENCOUNTER — Ambulatory Visit (INDEPENDENT_AMBULATORY_CARE_PROVIDER_SITE_OTHER): Admitting: Family Medicine

## 2023-05-20 ENCOUNTER — Encounter: Payer: Self-pay | Admitting: Family Medicine

## 2023-05-20 VITALS — BP 147/83 | HR 64 | Temp 98.4°F | Ht 59.0 in | Wt 145.0 lb

## 2023-05-20 DIAGNOSIS — H66002 Acute suppurative otitis media without spontaneous rupture of ear drum, left ear: Secondary | ICD-10-CM | POA: Diagnosis not present

## 2023-05-20 MED ORDER — AMOXICILLIN-POT CLAVULANATE 875-125 MG PO TABS: 1.0000 | ORAL_TABLET | Freq: Two times a day (BID) | ORAL | 0 refills | Status: DC

## 2023-05-20 NOTE — Assessment & Plan Note (Signed)
 Acute, some improvement but not resolved after 7 days of Augmentin.  Left ear drum still shows erythema and pus.  I instructed her to add Flonase 2 sprays per nostril daily to help eustachian tubes drain any mucus.  We will repeat another 7 days of Augmentin.  If her symptoms are not continuing to improve she will follow-up with her PCP or consider ENT referral.  Return and ER precautions provided.

## 2023-05-20 NOTE — Progress Notes (Signed)
 Patient ID: Shatira Dobosz, female    DOB: November 09, 1950, 73 y.o.   MRN: [id number]  This visit was conducted in person.  BP (!) 160/80 (BP Location: Right Arm, Patient Position: Sitting, Cuff Size: Normal)   Pulse 65   Temp 98.4 F (36.9 C) (Oral)   Ht 4\' 11"  (1.499 m)   Wt 145 lb (65.8 kg)   SpO2 97%   BMI 29.29 kg/m    CC:  Chief Complaint  Patient presents with   Otitis Media    Follow up Fast Med 05/08/23    Subjective:   HPI: Keyondra Lagrand is a 73 y.o. female presenting on 05/20/2023 for Otitis Media (Follow up Fast Med 05/08/23)  Seen at urgent care on 05/08/2023 for  left OM.  Had had symptoms x 24 hours, ear pain, ear discharge.  Treated with  Augmentin x 7 days.  Date of onset:  2 weeks ago  Symptoms improved but she still feels fullness in both ears.  No pain in ears.  Can hear heartbeat in ears.  No fever. No SOB.   Sick contacts:  none COVID testing:   none     She has tried to treat with  allergy meds, eye drops and nasal spray.    No history of chronic lung disease such as asthma or COPD. Non-smoker.    She  has noted body and joint ache that she now thinks is likely secondary to her Fosamax.  She has held it for 1 week and is feeling better.  She does have follow-up with Dr. Patsy Lager next week regarding her tendinitis and shoulder fracture.  She plans to send a MyChart message to her PCP to let her know about the Fosamax.      Relevant past medical, surgical, family and social history reviewed and updated as indicated. Interim medical history since our last visit reviewed. Allergies and medications reviewed and updated. Outpatient Medications Prior to Visit  Medication Sig Dispense Refill   acetaminophen (TYLENOL) 500 MG tablet Take 1,000 mg by mouth every 8 (eight) hours as needed for mild pain or moderate pain.     atorvastatin (LIPITOR) 10 MG tablet TAKE ONE TABLET BY MOUTH ONCE A DAY FOR CHOLESTEROL 90 tablet 2   azelastine (ASTELIN) 0.1 % nasal spray  Place 1 spray into both nostrils 2 (two) times daily. Use in each nostril as directed 30 mL 0   azelastine (OPTIVAR) 0.05 % ophthalmic solution Place 1 drop into both eyes daily.     Cholecalciferol (D3 VITAMIN PO) Take by mouth.     Cyanocobalamin (B-12) 3000 MCG CAPS Take 1 capsule by mouth daily.     diclofenac (VOLTAREN) 75 MG EC tablet Take 1 tablet (75 mg total) by mouth 2 (two) times daily. 60 tablet 3   diclofenac Sodium (VOLTAREN) 1 % GEL Apply 2 g topically 3 (three) times daily as needed. 300 g 0   levocetirizine (XYZAL) 5 MG tablet Take 5 mg by mouth daily as needed.     levothyroxine (SYNTHROID) 88 MCG tablet Take 1 tablet by mouth every morning on an empty stomach with water only.  No food or other medications for 30 minutes. 90 tablet 3   Misc Natural Products (GLUCOS-CHONDROIT-MSM-TURMERIC PO) Take 3 tablets by mouth daily.     montelukast (SINGULAIR) 10 MG tablet Take by mouth.     Multiple Minerals-Vitamins (CALCIUM & VIT D3 BONE HEALTH PO) Take 1 capsule by mouth daily.  ofloxacin (FLOXIN) 0.3 % OTIC solution Place 10 drops into the left ear daily.     ferrous sulfate 325 (65 FE) MG tablet Take 325 mg by mouth daily with breakfast.     alendronate (FOSAMAX) 70 MG tablet Take 1 tablet (70 mg total) by mouth every 7 (seven) days. Take with a full glass of water on an empty stomach.  Avoid laying flat for 2 hours. (Patient not taking: Reported on 05/20/2023) 12 tablet 3   No facility-administered medications prior to visit.     Per HPI unless specifically indicated in ROS section below Review of Systems  Constitutional:  Negative for fatigue and fever.  HENT:  Negative for congestion, ear discharge and ear pain.        Ear fullness  Eyes:  Negative for pain.  Respiratory:  Negative for cough and shortness of breath.   Cardiovascular:  Negative for chest pain, palpitations and leg swelling.  Gastrointestinal:  Negative for abdominal pain.  Genitourinary:  Negative for  dysuria and vaginal bleeding.  Musculoskeletal:  Negative for back pain.  Neurological:  Negative for syncope, light-headedness and headaches.  Psychiatric/Behavioral:  Negative for dysphoric mood.    Objective:  BP (!) 160/80 (BP Location: Right Arm, Patient Position: Sitting, Cuff Size: Normal)   Pulse 65   Temp 98.4 F (36.9 C) (Oral)   Ht 4\' 11"  (1.499 m)   Wt 145 lb (65.8 kg)   SpO2 97%   BMI 29.29 kg/m   Wt Readings from Last 3 Encounters:  05/20/23 145 lb (65.8 kg)  03/31/23 147 lb 8 oz (66.9 kg)  03/22/23 147 lb 2 oz (66.7 kg)      Physical Exam Constitutional:      General: She is not in acute distress.    Appearance: She is well-developed. She is not ill-appearing or toxic-appearing.  HENT:     Head: Normocephalic.     Right Ear: Hearing, tympanic membrane, ear canal and external ear normal. Tympanic membrane is not erythematous, retracted or bulging.     Left Ear: Hearing, ear canal and external ear normal. A middle ear effusion is present. Tympanic membrane is injected, scarred and erythematous. Tympanic membrane is not retracted or bulging.     Nose: Mucosal edema and rhinorrhea present.     Right Sinus: No maxillary sinus tenderness or frontal sinus tenderness.     Left Sinus: No maxillary sinus tenderness or frontal sinus tenderness.     Mouth/Throat:     Pharynx: Uvula midline.  Eyes:     General: Lids are normal. Lids are everted, no foreign bodies appreciated.     Conjunctiva/sclera: Conjunctivae normal.     Pupils: Pupils are equal, round, and reactive to light.  Neck:     Thyroid: No thyroid mass or thyromegaly.     Vascular: No carotid bruit.     Trachea: Trachea normal.  Cardiovascular:     Rate and Rhythm: Normal rate and regular rhythm.     Pulses: Normal pulses.     Heart sounds: Normal heart sounds, S1 normal and S2 normal. No murmur heard.    No friction rub. No gallop.  Pulmonary:     Effort: Pulmonary effort is normal. No tachypnea or  respiratory distress.     Breath sounds: Normal breath sounds. No decreased breath sounds, wheezing, rhonchi or rales.  Musculoskeletal:     Cervical back: Normal range of motion and neck supple.  Skin:    General: Skin is warm and  dry.     Findings: No rash.  Neurological:     Mental Status: She is alert.  Psychiatric:        Mood and Affect: Mood is not anxious or depressed.        Speech: Speech normal.        Behavior: Behavior normal. Behavior is cooperative.        Judgment: Judgment normal.       Results for orders placed or performed in visit on 10/20/22  Lipid panel   Collection Time: 10/20/22 11:38 AM  Result Value Ref Range   Cholesterol 158 0 - 200 mg/dL   Triglycerides 528.4 (H) 0.0 - 149.0 mg/dL   HDL 13.24 >40.10 mg/dL   VLDL 27.2 (H) 0.0 - 53.6 mg/dL   Total CHOL/HDL Ratio 3    NonHDL 105.92   Hemoglobin A1c   Collection Time: 10/20/22 11:38 AM  Result Value Ref Range   Hgb A1c MFr Bld 6.2 4.6 - 6.5 %  TSH   Collection Time: 10/20/22 11:38 AM  Result Value Ref Range   TSH 1.57 0.35 - 5.50 uIU/mL  LDL cholesterol, direct   Collection Time: 10/20/22 11:38 AM  Result Value Ref Range   Direct LDL 83.0 mg/dL    Assessment and Plan  Non-recurrent acute suppurative otitis media of left ear without spontaneous rupture of tympanic membrane Assessment & Plan: Acute, some improvement but not resolved after 7 days of Augmentin.  Left ear drum still shows erythema and pus.  I instructed her to add Flonase 2 sprays per nostril daily to help eustachian tubes drain any mucus.  We will repeat another 7 days of Augmentin.  If her symptoms are not continuing to improve she will follow-up with her PCP or consider ENT referral.  Return and ER precautions provided.   Other orders -     Amoxicillin-Pot Clavulanate; Take 1 tablet by mouth 2 (two) times daily.  Dispense: 14 tablet; Refill: 0    No follow-ups on file.   Kerby Nora, MD

## 2023-05-23 NOTE — Progress Notes (Unsigned)
   Mallory Crisostomo T. Vina Byrd, MD, CAQ Sports Medicine [facility] [address] [place], [address]  Phone: [phone]  FAX: [phone]  Mallory Leblanc - 73 y.o. female  MRN [id number]  Date of Birth: Sep 19, 1950  Date: 05/24/2023  PCP: Doreene Nest, NP  Referral: Doreene Nest, NP  No chief complaint on file.  Subjective:   Mallory Leblanc is a 73 y.o. very pleasant female patient with There is no height or weight on file to calculate BMI. who presents with the following:  Patient presents in follow-up for some ongoing bilateral shoulder pain.  Last time I saw her I did do intra-articular injections of the shoulder with formal physical therapy referral, as well.  There was a question of coracoid fracture seen on x-ray, but at that point when I saw her last coracoid itself was only mildly tender to palpation.    Review of Systems is noted in the HPI, as appropriate  Objective:   There were no vitals taken for this visit.  GEN: No acute distress; alert,appropriate. PULM: Breathing comfortably in no respiratory distress PSYCH: Normally interactive.   Laboratory and Imaging Data:  Assessment and Plan:   ***

## 2023-05-24 ENCOUNTER — Encounter: Payer: Self-pay | Admitting: Family Medicine

## 2023-05-24 ENCOUNTER — Ambulatory Visit (INDEPENDENT_AMBULATORY_CARE_PROVIDER_SITE_OTHER): Payer: 59 | Admitting: Family Medicine

## 2023-05-24 VITALS — BP 150/80 | HR 74 | Temp 99.2°F | Ht 59.0 in | Wt 147.0 lb

## 2023-05-24 DIAGNOSIS — M2550 Pain in unspecified joint: Secondary | ICD-10-CM | POA: Diagnosis not present

## 2023-05-24 DIAGNOSIS — M67341 Transient synovitis, right hand: Secondary | ICD-10-CM

## 2023-05-24 DIAGNOSIS — M25511 Pain in right shoulder: Secondary | ICD-10-CM

## 2023-05-24 DIAGNOSIS — M67342 Transient synovitis, left hand: Secondary | ICD-10-CM

## 2023-05-24 NOTE — Patient Instructions (Addendum)
 If you are still having joint pain and swelling by Jul 08, 2023, PLEASE send Dr. Patsy Lager a message.  If so, I want to work you up for Rheumatological disease.  OSTEOARTHRITIS: Over the counter  Supplements: Tart cherry juice and Curcumin (Turmeric extract) have good scientific evidence  - get the concentrated capsules or gelcaps over the counter so you do not get the calories from the juice.

## 2023-05-25 ENCOUNTER — Encounter: Payer: Self-pay | Admitting: Family Medicine

## 2023-05-26 ENCOUNTER — Encounter: Payer: Self-pay | Admitting: Family Medicine

## 2023-05-26 MED ORDER — FLUCONAZOLE 150 MG PO TABS: 150.0000 mg | ORAL_TABLET | Freq: Once | ORAL | 0 refills | Status: AC

## 2023-05-31 DIAGNOSIS — M7989 Other specified soft tissue disorders: Secondary | ICD-10-CM | POA: Diagnosis not present

## 2023-05-31 DIAGNOSIS — M542 Cervicalgia: Secondary | ICD-10-CM | POA: Diagnosis not present

## 2023-05-31 DIAGNOSIS — M25512 Pain in left shoulder: Secondary | ICD-10-CM | POA: Diagnosis not present

## 2023-05-31 DIAGNOSIS — M25511 Pain in right shoulder: Secondary | ICD-10-CM | POA: Diagnosis not present

## 2023-06-01 ENCOUNTER — Encounter: Payer: Self-pay | Admitting: Family Medicine

## 2023-06-03 DIAGNOSIS — Z7960 Long term (current) use of unspecified immunomodulators and immunosuppressants: Secondary | ICD-10-CM | POA: Diagnosis not present

## 2023-06-03 DIAGNOSIS — E039 Hypothyroidism, unspecified: Secondary | ICD-10-CM

## 2023-06-03 DIAGNOSIS — Z7952 Long term (current) use of systemic steroids: Secondary | ICD-10-CM | POA: Diagnosis not present

## 2023-06-03 DIAGNOSIS — D649 Anemia, unspecified: Secondary | ICD-10-CM | POA: Diagnosis not present

## 2023-06-03 DIAGNOSIS — M0609 Rheumatoid arthritis without rheumatoid factor, multiple sites: Secondary | ICD-10-CM | POA: Diagnosis not present

## 2023-06-03 DIAGNOSIS — D638 Anemia in other chronic diseases classified elsewhere: Secondary | ICD-10-CM | POA: Diagnosis not present

## 2023-06-03 DIAGNOSIS — Z111 Encounter for screening for respiratory tuberculosis: Secondary | ICD-10-CM | POA: Diagnosis not present

## 2023-06-03 DIAGNOSIS — M810 Age-related osteoporosis without current pathological fracture: Secondary | ICD-10-CM | POA: Diagnosis not present

## 2023-06-21 ENCOUNTER — Other Ambulatory Visit

## 2023-06-21 DIAGNOSIS — E039 Hypothyroidism, unspecified: Secondary | ICD-10-CM | POA: Diagnosis not present

## 2023-06-21 LAB — TSH: TSH: 1.82 u[IU]/mL (ref 0.35–5.50)

## 2023-07-01 DIAGNOSIS — H1045 Other chronic allergic conjunctivitis: Secondary | ICD-10-CM | POA: Diagnosis not present

## 2023-07-01 DIAGNOSIS — J300 Vasomotor rhinitis: Secondary | ICD-10-CM | POA: Diagnosis not present

## 2023-07-01 DIAGNOSIS — J339 Nasal polyp, unspecified: Secondary | ICD-10-CM | POA: Diagnosis not present

## 2023-07-19 DIAGNOSIS — M069 Rheumatoid arthritis, unspecified: Secondary | ICD-10-CM | POA: Diagnosis not present

## 2023-07-19 DIAGNOSIS — Z7960 Long term (current) use of unspecified immunomodulators and immunosuppressants: Secondary | ICD-10-CM | POA: Diagnosis not present

## 2023-07-19 DIAGNOSIS — M0609 Rheumatoid arthritis without rheumatoid factor, multiple sites: Secondary | ICD-10-CM | POA: Diagnosis not present

## 2023-08-13 DIAGNOSIS — J339 Nasal polyp, unspecified: Secondary | ICD-10-CM | POA: Diagnosis not present

## 2023-08-13 DIAGNOSIS — H6982 Other specified disorders of Eustachian tube, left ear: Secondary | ICD-10-CM | POA: Diagnosis not present

## 2023-08-31 DIAGNOSIS — Z7960 Long term (current) use of unspecified immunomodulators and immunosuppressants: Secondary | ICD-10-CM | POA: Diagnosis not present

## 2023-08-31 DIAGNOSIS — M0609 Rheumatoid arthritis without rheumatoid factor, multiple sites: Secondary | ICD-10-CM | POA: Diagnosis not present

## 2023-09-17 ENCOUNTER — Other Ambulatory Visit: Payer: Self-pay | Admitting: Obstetrics and Gynecology

## 2023-09-17 DIAGNOSIS — Z1231 Encounter for screening mammogram for malignant neoplasm of breast: Secondary | ICD-10-CM

## 2023-09-27 ENCOUNTER — Other Ambulatory Visit: Payer: Self-pay | Admitting: Primary Care

## 2023-09-27 DIAGNOSIS — E785 Hyperlipidemia, unspecified: Secondary | ICD-10-CM

## 2023-09-27 NOTE — Telephone Encounter (Signed)
 Patient is due for CPE/follow up in mid to late August, this will be required prior to any further refills.  Please schedule, thank you!

## 2023-09-29 NOTE — Telephone Encounter (Signed)
 Lvm to schedule appt for mid to late August for CPE and same day fasting labs

## 2023-10-20 ENCOUNTER — Ambulatory Visit
Admission: RE | Admit: 2023-10-20 | Discharge: 2023-10-20 | Disposition: A | Discharge status: Home / Self Care | Source: Ambulatory Visit | Attending: Obstetrics and Gynecology | Admitting: Obstetrics and Gynecology

## 2023-10-20 DIAGNOSIS — Z1231 Encounter for screening mammogram for malignant neoplasm of breast: Secondary | ICD-10-CM | POA: Diagnosis not present

## 2023-10-21 ENCOUNTER — Encounter: Payer: Self-pay | Admitting: Primary Care

## 2023-10-21 ENCOUNTER — Ambulatory Visit (INDEPENDENT_AMBULATORY_CARE_PROVIDER_SITE_OTHER): Admitting: Primary Care

## 2023-10-21 VITALS — BP 132/80 | HR 71 | Temp 97.2°F | Ht 59.0 in | Wt 144.0 lb

## 2023-10-21 DIAGNOSIS — D0512 Intraductal carcinoma in situ of left breast: Secondary | ICD-10-CM

## 2023-10-21 DIAGNOSIS — R7303 Prediabetes: Secondary | ICD-10-CM | POA: Diagnosis not present

## 2023-10-21 DIAGNOSIS — E785 Hyperlipidemia, unspecified: Secondary | ICD-10-CM

## 2023-10-21 DIAGNOSIS — M810 Age-related osteoporosis without current pathological fracture: Secondary | ICD-10-CM | POA: Diagnosis not present

## 2023-10-21 DIAGNOSIS — M069 Rheumatoid arthritis, unspecified: Secondary | ICD-10-CM | POA: Insufficient documentation

## 2023-10-21 DIAGNOSIS — M0609 Rheumatoid arthritis without rheumatoid factor, multiple sites: Secondary | ICD-10-CM

## 2023-10-21 DIAGNOSIS — Z0000 Encounter for general adult medical examination without abnormal findings: Secondary | ICD-10-CM

## 2023-10-21 DIAGNOSIS — E039 Hypothyroidism, unspecified: Secondary | ICD-10-CM

## 2023-10-21 DIAGNOSIS — J309 Allergic rhinitis, unspecified: Secondary | ICD-10-CM

## 2023-10-21 LAB — LIPID PANEL
Cholesterol: 162 mg/dL (ref 0–200)
HDL: 70 mg/dL (ref >39.00)
LDL Cholesterol: 62 mg/dL (ref 0–99)
NonHDL: 92.2
Total CHOL/HDL Ratio: 2
Triglycerides: 151 mg/dL — ABNORMAL HIGH (ref 0.0–149.0)
VLDL: 30.2 mg/dL (ref 0.0–40.0)

## 2023-10-21 LAB — HEMOGLOBIN A1C: Hgb A1c MFr Bld: 6.4 % (ref 4.6–6.5)

## 2023-10-21 MED ORDER — ALENDRONATE SODIUM 70 MG PO TABS: 70.0000 mg | ORAL_TABLET | ORAL | 3 refills | Status: AC

## 2023-10-21 NOTE — Assessment & Plan Note (Signed)
 Bone density scan UTD. Reviewed with patient.  Discussed to resume Fosamax  as it may not have caused her joint aches now that she has treatment for RA. Resume Fosamax  70 mg weekly. She agrees.   Repeat bone density scan due in August 2026

## 2023-10-21 NOTE — Assessment & Plan Note (Signed)
 Immunizations UTD. Mammogram up-to-date Colon cancer screening up-to-date, Cologuard due in 2026  Discussed the importance of a healthy diet and regular exercise in order for weight loss, and to reduce the risk of further co-morbidity.  Exam stable. Labs pending.  Follow up in 1 year for repeat physical.

## 2023-10-21 NOTE — Assessment & Plan Note (Signed)
 Mammogram UTD, awaiting results. No longer following with oncology.

## 2023-10-21 NOTE — Assessment & Plan Note (Signed)
 Stable.  Continue montelukast  10 mg daily, Xyzal  5 mg daily

## 2023-10-21 NOTE — Assessment & Plan Note (Addendum)
 Repeat lipid panel pending.  Continue atorvastatin 10 mg daily.

## 2023-10-21 NOTE — Assessment & Plan Note (Addendum)
 Following with rheumatology, office notes and labs reviewed from June 2025.  Continue Plaqunil 200 mg BID, Enbrel 50 mg once weekly, methotrexate 20 mg weekly, prednisone  5 mg daily.  Continue diclofenac  gel as needed.

## 2023-10-21 NOTE — Assessment & Plan Note (Signed)
 She is taking levothyroxine  correctly.  Continue levothyroxine  88 mcg daily. Reviewed labs from April 2025.

## 2023-10-21 NOTE — Progress Notes (Signed)
 Subjective:    Patient ID: Mallory Leblanc, female    DOB: 1951/02/14, 73 y.o.   MRN: [id number]  HPI  Mallory Leblanc is a very pleasant 73 y.o. female who presents today for complete physical and follow up of chronic conditions.  Immunizations:  -Shingles: Completed Shingrix series -Pneumonia: Completed Prevnar 20 in 2022, Pneumovax 23 in 2023  Diet: Fair diet.  Exercise: No regular exercise.  Eye exam: Completes annually  Dental exam: Completes semi-annually     Mammogram: Completed yesterday Bone Density Scan: Completed in August 2024, follows with rheumatology.  Colonoscopy: Completed Cologuard in 2023   BP Readings from Last 3 Encounters:  10/21/23 132/80  05/24/23 (!) 150/80  05/20/23 (!) 147/83       Review of Systems  Constitutional:  Negative for unexpected weight change.  HENT:  Negative for rhinorrhea.   Respiratory:  Negative for cough and shortness of breath.   Cardiovascular:  Negative for chest pain.  Gastrointestinal:  Negative for constipation and diarrhea.  Genitourinary:  Negative for difficulty urinating.  Musculoskeletal:  Positive for arthralgias.  Skin:  Negative for rash.  Allergic/Immunologic: Negative for environmental allergies.  Neurological:  Negative for dizziness and headaches.  Psychiatric/Behavioral:  The patient is not nervous/anxious.          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   Genital warts    GERD (gastroesophageal reflux disease)    OCC   Hyperlipidemia    Hypothyroidism    Personal history of radiation therapy 2018   F/U left breast cancer    Social History   Socioeconomic History   Marital status: Married    Spouse name: Not on file   Number of children: Not on file   Years of education: Not on file   Highest education level: Associate degree: academic program  Occupational History   Not on file  Tobacco Use    Smoking status: Never   Smokeless tobacco: Never  Vaping Use   Vaping status: Never Used  Substance and Sexual Activity   Alcohol use: Yes    Alcohol/week: 6.0 standard drinks of alcohol    Types: 6 Cans of beer per week    Comment: BEER OCC   Drug use: No   Sexual activity: Not on file  Other Topics Concern   Not on file  Social History Narrative   Married.   1 child.    Works as a Architectural technologist.   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   Social Drivers of Corporate investment banker Strain: Low Risk  (06/03/2023)   Received from [facility]   Overall Financial Resource Strain (CARDIA)    Difficulty of Paying Living Expenses: Not hard at all  Food Insecurity: No Food Insecurity (06/03/2023)   Received from [facility]   Hunger Vital Sign    Within the past 12 months, you worried that your food would run out before you got the money to buy more.: Never true    Within the past 12 months, the food you bought just didn't last and you didn't have money to get more.: Never true  Transportation Needs: No Transportation Needs (06/03/2023)   Received from [facility] - Transportation    In the past 12 months, has  lack of transportation kept you from medical appointments or from getting medications?: No    Lack of Transportation (Non-Medical): No  Physical Activity: Sufficiently Active (02/25/2023)   Exercise Vital Sign    Days of Exercise per Week: 5 days    Minutes of Exercise per Session: 40 min  Stress: Stress Concern Present (02/25/2023)   [employer] of Occupational Health - Occupational Stress Questionnaire    Feeling of Stress : To some extent  Social Connections: Socially Integrated (02/25/2023)   Social Connection and Isolation Panel    Frequency of Communication with Friends and Family: More than three times a week    Frequency of Social Gatherings with  Friends and Family: More than three times a week    Attends Religious Services: More than 4 times per year    Active Member of Clubs or Organizations: Yes    Attends Banker Meetings: More than 4 times per year    Marital Status: Married  Catering manager Violence: Not At Risk (01/22/2023)   Humiliation, Afraid, Rape, and Kick questionnaire    Fear of Current or Ex-Partner: No    Emotionally Abused: No    Physically Abused: No    Sexually Abused: No    Past Surgical History:  Procedure Laterality Date   APPENDECTOMY     BREAST BIOPSY Left 09/21/2016   DCIS   BREAST LUMPECTOMY Left 2018   DCIS   cyst     on the left ovary early 80's   ETHMOIDECTOMY Bilateral 01/17/2019   Procedure: ETHMOIDECTOMY;  Surgeon: Blair Mt, MD;  Location: [facility];  Service: ENT;  Laterality: Bilateral;   FRONTAL SINUS EXPLORATION Bilateral 01/17/2019   Procedure: FRONTAL SINUS EXPLORATION;  Surgeon: Blair Mt, MD;  Location: [facility];  Service: ENT;  Laterality: Bilateral;   IMAGE GUIDED SINUS SURGERY N/A 01/17/2019   Procedure: IMAGE GUIDED SINUS SURGERY;  Surgeon: Blair Mt, MD;  Location: [facility];  Service: ENT;  Laterality: N/A;  NEED STRYKER DISK put disk on or charge nurse desk 10-30  kp gave 2nd disk to Devere 11-4 kp   MAXILLARY ANTROSTOMY Bilateral 01/17/2019   Procedure: MAXILLARY ANTROSTOMY;  Surgeon: Blair Mt, MD;  Location: [facility];  Service: ENT;  Laterality: Bilateral;   PARTIAL MASTECTOMY WITH NEEDLE LOCALIZATION Left 10/16/2016   Procedure: PARTIAL MASTECTOMY WITH NEEDLE LOCALIZATION;  Surgeon: Claudene Larinda Bolder, MD;  Location: [facility];  Service: General;  Laterality: Left;   SENTINEL NODE BIOPSY Left 10/16/2016   Procedure: SENTINEL NODE BIOPSY;  Surgeon: Claudene Larinda Bolder, MD;  Location: [facility];  Service: General;  Laterality: Left;   SPHENOIDECTOMY Left 01/17/2019   Procedure: SPHENOIDOTOMY;  Surgeon:  Blair Mt, MD;  Location: [facility];  Service: ENT;  Laterality: Left;   WRIST FRACTURE SURGERY Right     Family History  Problem Relation Age of Onset   Heart disease Father    Breast cancer Neg Hx     Allergies  Allergen Reactions   Sulfa Antibiotics Rash    Current Outpatient Medications on File Prior to Visit  Medication Sig Dispense Refill   acetaminophen  (TYLENOL ) 500 MG tablet Take 1,000 mg by mouth every 8 (eight) hours as needed for mild pain or moderate pain.     atorvastatin  (LIPITOR) 10 MG tablet TAKE 1 TABLET BY MOUTH EVERY DAY FOR CHOLESTEROL 90 tablet 0   azelastine  (ASTELIN ) 0.1 % nasal spray Place 1 spray into both nostrils 2 (two) times daily.  Use in each nostril as directed 30 mL 0   azelastine  (OPTIVAR ) 0.05 % ophthalmic solution Place 1 drop into both eyes daily.     Cyanocobalamin  (B-12) 3000 MCG CAPS Take 1 capsule by mouth daily.     diclofenac  Sodium (VOLTAREN ) 1 % GEL Apply 2 g topically 3 (three) times daily as needed. 300 g 0   etanercept (ENBREL MINI) 50 MG/ML injection Inject 50 mg into the skin.     folic acid  (FOLVITE ) 1 MG tablet Take 1 mg by mouth daily.     levocetirizine (XYZAL ) 5 MG tablet Take 5 mg by mouth daily as needed.     levothyroxine  (SYNTHROID ) 88 MCG tablet Take 1 tablet by mouth every morning on an empty stomach with water only.  No food or other medications for 30 minutes. 90 tablet 3   methotrexate (RHEUMATREX) 2.5 MG tablet Take 2.5 mg by mouth once a week. Caution:Chemotherapy. Protect from light.     Misc Natural Products (GLUCOS-CHONDROIT-MSM-TURMERIC PO) Take 3 tablets by mouth daily.     montelukast  (SINGULAIR ) 10 MG tablet Take by mouth.     Multiple Minerals-Vitamins (CALCIUM  & VIT D3 BONE HEALTH PO) Take 1 capsule by mouth daily.     predniSONE  (DELTASONE ) 5 MG tablet Take 5 mg by mouth daily with breakfast.     Cholecalciferol (D3 VITAMIN PO) Take by mouth. (Patient not taking: Reported on 10/21/2023)      diclofenac  (VOLTAREN ) 75 MG EC tablet Take 1 tablet (75 mg total) by mouth 2 (two) times daily. (Patient not taking: Reported on 10/21/2023) 60 tablet 3   No current facility-administered medications on file prior to visit.    BP 132/80   Pulse 71   Temp (!) 97.2 F (36.2 C) (Temporal)   Ht 4' 11 (1.499 m)   Wt 144 lb (65.3 kg)   SpO2 98%   BMI 29.08 kg/m  Objective:   Physical Exam HENT:     Right Ear: Tympanic membrane and ear canal normal.     Left Ear: Tympanic membrane and ear canal normal.  Eyes:     Pupils: Pupils are equal, round, and reactive to light.  Cardiovascular:     Rate and Rhythm: Normal rate and regular rhythm.  Pulmonary:     Effort: Pulmonary effort is normal.     Breath sounds: Normal breath sounds.  Abdominal:     General: Bowel sounds are normal.     Palpations: Abdomen is soft.     Tenderness: There is no abdominal tenderness.  Musculoskeletal:        General: Normal range of motion.     Cervical back: Neck supple.  Skin:    General: Skin is warm and dry.  Neurological:     Mental Status: She is alert and oriented to person, place, and time.     Cranial Nerves: No cranial nerve deficit.     Deep Tendon Reflexes:     Reflex Scores:      Patellar reflexes are 2+ on the right side and 2+ on the left side. Psychiatric:        Mood and Affect: Mood normal.           Assessment & Plan:  Preventative health care Assessment & Plan: Immunizations UTD. Mammogram up-to-date Colon cancer screening up-to-date, Cologuard due in 2026  Discussed the importance of a healthy diet and regular exercise in order for weight loss, and to reduce the risk of further co-morbidity.  Exam  stable. Labs pending.  Follow up in 1 year for repeat physical.    Rheumatoid arthritis of multiple sites with negative rheumatoid factor [facility]) Assessment & Plan: Following with rheumatology, office notes and labs reviewed from June 2025.  Continue Plaqunil 200 mg  BID, Enbrel 50 mg once weekly, methotrexate 20 mg weekly, prednisone  5 mg daily.  Continue diclofenac  gel as needed.   Age-related osteoporosis without current pathological fracture Assessment & Plan: Bone density scan UTD. Reviewed with patient.  Discussed to resume Fosamax  as it may not have caused her joint aches now that she has treatment for RA. Resume Fosamax  70 mg weekly. She agrees.   Repeat bone density scan due in August 2026   Orders: -     Alendronate  Sodium; Take 1 tablet (70 mg total) by mouth every 7 (seven) days. Take with a full glass of water on an empty stomach.  Avoid laying flat for 2 hours after taking.  Dispense: 12 tablet; Refill: 3  Hypothyroidism, unspecified type Assessment & Plan: She is taking levothyroxine  correctly.  Continue levothyroxine  88 mcg daily. Reviewed labs from April 2025.    Ductal carcinoma in situ (DCIS) of left breast Assessment & Plan: Mammogram UTD, awaiting results. No longer following with oncology.   Hyperlipidemia, unspecified hyperlipidemia type Assessment & Plan: Repeat lipid panel pending.  Continue atorvastatin  10 mg daily   Orders: -     Lipid panel  Prediabetes Assessment & Plan: Repeat A1C pending.  Orders: -     Hemoglobin A1c  Allergic rhinitis, unspecified seasonality, unspecified trigger Assessment & Plan: Stable.  Continue montelukast  10 mg daily, Xyzal  5 mg daily         Josedaniel Haye K Briggs Edelen, NP

## 2023-10-21 NOTE — Assessment & Plan Note (Signed)
 Repeat A1C pending.

## 2023-10-21 NOTE — Patient Instructions (Signed)
 Stop by the lab prior to leaving today. I will notify you of your results once received.   Resume Fosamax  once weekly pill for bone density.  Take 1 tablet by mouth once weekly on an empty stomach with water only.  Avoid laying flat for 2 hours after taking.  It was a pleasure to see you today!

## 2023-10-22 ENCOUNTER — Ambulatory Visit: Payer: Self-pay | Admitting: Primary Care

## 2023-10-22 DIAGNOSIS — R7303 Prediabetes: Secondary | ICD-10-CM

## 2023-10-25 DIAGNOSIS — Z1331 Encounter for screening for depression: Secondary | ICD-10-CM | POA: Diagnosis not present

## 2023-10-25 DIAGNOSIS — Z124 Encounter for screening for malignant neoplasm of cervix: Secondary | ICD-10-CM | POA: Diagnosis not present

## 2023-10-27 NOTE — Telephone Encounter (Signed)
 Copied from CRM [id number]. Topic: Clinical - Lab/Test Results >> Oct 27, 2023 11:37 AM Maisie BROCKS wrote: Reason for CRM: pt called and missed a call from nurse to relay lab results. I relayed results and scheduled patient a lab appt in 6 mos. Pt verbalized understanding of results.

## 2023-12-01 DIAGNOSIS — M810 Age-related osteoporosis without current pathological fracture: Secondary | ICD-10-CM | POA: Diagnosis not present

## 2023-12-01 DIAGNOSIS — M0609 Rheumatoid arthritis without rheumatoid factor, multiple sites: Secondary | ICD-10-CM | POA: Diagnosis not present

## 2023-12-01 DIAGNOSIS — Z7960 Long term (current) use of unspecified immunomodulators and immunosuppressants: Secondary | ICD-10-CM | POA: Diagnosis not present

## 2023-12-24 ENCOUNTER — Other Ambulatory Visit: Payer: Self-pay | Admitting: Primary Care

## 2023-12-24 DIAGNOSIS — E785 Hyperlipidemia, unspecified: Secondary | ICD-10-CM

## 2024-01-08 ENCOUNTER — Other Ambulatory Visit: Payer: Self-pay | Admitting: Primary Care

## 2024-01-08 DIAGNOSIS — E039 Hypothyroidism, unspecified: Secondary | ICD-10-CM

## 2024-01-20 ENCOUNTER — Telehealth: Payer: Self-pay

## 2024-01-20 NOTE — Telephone Encounter (Signed)
 Mallory Leblanc

## 2024-01-20 NOTE — Telephone Encounter (Signed)
 Sending note to [facility] admin.

## 2024-01-28 DIAGNOSIS — Z83518 Family history of other specified eye disorder: Secondary | ICD-10-CM | POA: Diagnosis not present

## 2024-01-28 DIAGNOSIS — H5203 Hypermetropia, bilateral: Secondary | ICD-10-CM | POA: Diagnosis not present

## 2024-01-28 DIAGNOSIS — H2513 Age-related nuclear cataract, bilateral: Secondary | ICD-10-CM | POA: Diagnosis not present

## 2024-01-28 DIAGNOSIS — H524 Presbyopia: Secondary | ICD-10-CM | POA: Diagnosis not present

## 2024-01-28 DIAGNOSIS — H25013 Cortical age-related cataract, bilateral: Secondary | ICD-10-CM | POA: Diagnosis not present

## 2024-02-01 ENCOUNTER — Encounter

## 2024-02-15 ENCOUNTER — Telehealth: Payer: Self-pay

## 2024-02-15 NOTE — Telephone Encounter (Signed)
 MyChart message sent regarding Physical Therapy referral from back in January.

## 2024-02-17 DIAGNOSIS — M0609 Rheumatoid arthritis without rheumatoid factor, multiple sites: Secondary | ICD-10-CM | POA: Diagnosis not present

## 2024-04-11 ENCOUNTER — Encounter: Payer: Self-pay | Admitting: Oncology

## 2024-04-21 ENCOUNTER — Ambulatory Visit

## 2024-04-24 ENCOUNTER — Other Ambulatory Visit
